# Patient Record
Sex: Female | Born: 1935 | Race: White | Hispanic: No | Marital: Married | Smoking: Former smoker
Health system: Southern US, Community
[De-identification: ages and names within clinical notes are randomized; demographics above are authoritative.]

## PROBLEM LIST (undated history)

## (undated) DIAGNOSIS — J449 Chronic obstructive pulmonary disease, unspecified: Secondary | ICD-10-CM

## (undated) DIAGNOSIS — I639 Cerebral infarction, unspecified: Secondary | ICD-10-CM

## (undated) DIAGNOSIS — M1990 Unspecified osteoarthritis, unspecified site: Secondary | ICD-10-CM

## (undated) DIAGNOSIS — I10 Essential (primary) hypertension: Secondary | ICD-10-CM

## (undated) DIAGNOSIS — F419 Anxiety disorder, unspecified: Secondary | ICD-10-CM

## (undated) DIAGNOSIS — W19XXXA Unspecified fall, initial encounter: Secondary | ICD-10-CM

## (undated) HISTORY — PX: REPLACEMENT TOTAL KNEE BILATERAL: SUR1225

## (undated) HISTORY — DX: Unspecified fall, initial encounter: W19.XXXA

## (undated) HISTORY — PX: APPENDECTOMY: SHX54

## (undated) HISTORY — PX: HUMERUS FRACTURE SURGERY: SHX670

---

## 2006-08-13 ENCOUNTER — Emergency Department (HOSPITAL_COMMUNITY): Admission: EM | Admit: 2006-08-13 | Discharge: 2006-08-13 | Payer: Self-pay | Admitting: Emergency Medicine

## 2006-09-13 ENCOUNTER — Ambulatory Visit (HOSPITAL_COMMUNITY): Admission: RE | Admit: 2006-09-13 | Discharge: 2006-09-13 | Payer: Self-pay | Admitting: Family Medicine

## 2007-08-25 ENCOUNTER — Ambulatory Visit (HOSPITAL_COMMUNITY): Admission: RE | Admit: 2007-08-25 | Discharge: 2007-08-25 | Payer: Self-pay | Admitting: Gastroenterology

## 2007-08-25 ENCOUNTER — Encounter: Payer: Self-pay | Admitting: Gastroenterology

## 2007-08-25 ENCOUNTER — Ambulatory Visit: Payer: Self-pay | Admitting: Gastroenterology

## 2007-12-19 ENCOUNTER — Ambulatory Visit (HOSPITAL_COMMUNITY): Admission: RE | Admit: 2007-12-19 | Discharge: 2007-12-19 | Payer: Self-pay | Admitting: Family Medicine

## 2009-01-08 ENCOUNTER — Ambulatory Visit (HOSPITAL_COMMUNITY): Admission: RE | Admit: 2009-01-08 | Discharge: 2009-01-08 | Payer: Self-pay | Admitting: Family Medicine

## 2010-01-12 ENCOUNTER — Ambulatory Visit (HOSPITAL_COMMUNITY): Admission: RE | Admit: 2010-01-12 | Discharge: 2010-01-12 | Payer: Self-pay | Admitting: Family Medicine

## 2010-03-03 ENCOUNTER — Encounter: Admission: RE | Admit: 2010-03-03 | Discharge: 2010-03-03 | Payer: Self-pay | Admitting: Internal Medicine

## 2010-12-08 ENCOUNTER — Other Ambulatory Visit (HOSPITAL_COMMUNITY): Payer: Self-pay | Admitting: Family Medicine

## 2010-12-08 ENCOUNTER — Other Ambulatory Visit: Payer: Self-pay | Admitting: Family Medicine

## 2010-12-08 DIAGNOSIS — Z1239 Encounter for other screening for malignant neoplasm of breast: Secondary | ICD-10-CM

## 2011-01-14 ENCOUNTER — Ambulatory Visit (HOSPITAL_COMMUNITY): Payer: Self-pay

## 2011-01-28 ENCOUNTER — Ambulatory Visit (HOSPITAL_COMMUNITY)
Admission: RE | Admit: 2011-01-28 | Discharge: 2011-01-28 | Disposition: A | Discharge status: Home / Self Care | Payer: Medicare Other | Source: Ambulatory Visit | Attending: Family Medicine | Admitting: Family Medicine

## 2011-01-28 DIAGNOSIS — Z1239 Encounter for other screening for malignant neoplasm of breast: Secondary | ICD-10-CM

## 2011-01-28 DIAGNOSIS — Z1231 Encounter for screening mammogram for malignant neoplasm of breast: Secondary | ICD-10-CM | POA: Insufficient documentation

## 2011-03-30 NOTE — Op Note (Signed)
NAMEMARVELL, STAVOLA               ACCOUNT NO.:  [id number]   MEDICAL RECORD NO.:  [id number]          PATIENT TYPE:  AMB   LOCATION:  DAY                           FACILITY:  [facility]   PHYSICIAN:  Kassie Mends, M.D.      DATE OF BIRTH:  09-May-1932   DATE OF PROCEDURE:  08/25/2007  DATE OF DISCHARGE:                               OPERATIVE REPORT   REFERRING PHYSICIAN:  [facility].   PROCEDURE:  Ileocolonoscopy with snare cautery polypectomy.   INDICATION FOR EXAMINATION:  Ms. Alatorre is a 77 year old female who  presents with a family history of colon cancer.  She presents for colon  cancer screening.   FINDINGS:  1. An 8-mm sessile polyp seen in the sigmoid colon.  It was located on      the fold.  It was removed via snare cautery.  2. Sigmoid colon diverticulosis.  3. Small internal hemorrhoids.  Otherwise no masses, inflammatory      changes or arteriovenous malformation seen.   RECOMMENDATIONS:  1. No aspirin, NSAIDs or anticoagulation for 7 days.  2. She should follow a high-fiber diet.  She is given a handout on      high-fiber diet, polyps, diverticulosis and hemorrhoids.  3. Will call Ms. Highland with the results of her biopsies.  She will      likely need a screening colonoscopy in 5 years.  If polyp is      adenomatous, then her children should have a screening colonoscopy      at age 75 and then every 10 years because her polyp will be      diagnosed after the age of 31.   MEDICATIONS:  1. Demerol 100 mg IV.  2. Versed 6 mg IV.   PROCEDURE TECHNIQUE:  Physical exam was performed.  Informed consent was  obtained from the patient explaining the benefits, risks and  alternatives to the procedure.  The patient was connected to the monitor  and placed in the left lateral position.  Continuous oxygen was provided  by nasal cannula and IV medicine administered through an indwelling  cannula.  After administration of sedation and rectal exam, the  patient's  rectum was intubated,  and the scope was advanced under direct visualization to the distal  terminal ileum.  The scope was removed slowly by carefully examining the  color, texture, anatomy and integrity of the mucosa on the way out.  The  patient was recovered in endoscopy and discharged home in satisfactory  condition.      Kassie Mends, M.D.  Electronically Signed     SM/MEDQ  D:  08/25/2007  T:  08/26/2007  Job:  [id number]   cc:   Gloriajean Dell. Andrey Campanile, M.D.  Fax: [phone]

## 2012-01-12 ENCOUNTER — Other Ambulatory Visit (HOSPITAL_COMMUNITY): Payer: Self-pay | Admitting: Family Medicine

## 2012-01-12 DIAGNOSIS — Z139 Encounter for screening, unspecified: Secondary | ICD-10-CM

## 2012-01-31 ENCOUNTER — Ambulatory Visit (HOSPITAL_COMMUNITY): Payer: Medicare Other

## 2012-02-01 ENCOUNTER — Ambulatory Visit (HOSPITAL_COMMUNITY)
Admission: RE | Admit: 2012-02-01 | Discharge: 2012-02-01 | Disposition: A | Discharge status: Home / Self Care | Payer: Medicare Other | Source: Ambulatory Visit | Attending: Family Medicine | Admitting: Family Medicine

## 2012-02-01 DIAGNOSIS — Z139 Encounter for screening, unspecified: Secondary | ICD-10-CM

## 2012-02-01 DIAGNOSIS — Z1231 Encounter for screening mammogram for malignant neoplasm of breast: Secondary | ICD-10-CM | POA: Insufficient documentation

## 2012-08-03 ENCOUNTER — Encounter: Payer: Self-pay | Admitting: *Deleted

## 2012-10-25 ENCOUNTER — Encounter: Payer: Self-pay | Admitting: *Deleted

## 2012-11-27 ENCOUNTER — Telehealth: Payer: Self-pay | Admitting: *Deleted

## 2012-11-27 NOTE — Telephone Encounter (Signed)
I called [name] [name] today to try to set up an appointment for her screening TCS, and she said that she would like to think about it and call us back.

## 2013-01-03 ENCOUNTER — Other Ambulatory Visit (HOSPITAL_COMMUNITY): Payer: Self-pay | Admitting: Family Medicine

## 2013-01-03 DIAGNOSIS — Z139 Encounter for screening, unspecified: Secondary | ICD-10-CM

## 2013-02-09 ENCOUNTER — Ambulatory Visit (HOSPITAL_COMMUNITY)
Admission: RE | Admit: 2013-02-09 | Discharge: 2013-02-09 | Disposition: A | Discharge status: Home / Self Care | Payer: Medicare Other | Source: Ambulatory Visit | Attending: Family Medicine | Admitting: Family Medicine

## 2013-02-09 DIAGNOSIS — Z1231 Encounter for screening mammogram for malignant neoplasm of breast: Secondary | ICD-10-CM | POA: Insufficient documentation

## 2013-02-09 DIAGNOSIS — Z139 Encounter for screening, unspecified: Secondary | ICD-10-CM

## 2013-11-21 ENCOUNTER — Encounter (HOSPITAL_COMMUNITY): Payer: Self-pay | Admitting: Pharmacy Technician

## 2013-11-21 NOTE — Patient Instructions (Addendum)
Your procedure is scheduled on: 11/27/2013  Report to [facility] at  800  AM.  Call this number if you have problems the morning of surgery: [phone]   Do not eat food or drink liquids :After Midnight.      Take these medicines the morning of surgery with A SIP OF WATER: xanax, hydrodiuril, altace, ultram, arava. Take your inhaler before you come and bring it with you.   Do not wear jewelry, make-up or nail polish.  Do not wear lotions, powders, or perfumes.   Do not shave 48 hours prior to surgery.  Do not bring valuables to the hospital.  Contacts, dentures or bridgework may not be worn into surgery.  Leave suitcase in the car. After surgery it may be brought to your room.  For patients admitted to the hospital, checkout time is 11:00 AM the day of discharge.   Patients discharged the day of surgery will not be allowed to drive home.  :     Please read over the following fact sheets that you were given: Coughing and Deep Breathing, Surgical Site Infection Prevention, Anesthesia Post-op Instructions and Care and Recovery After Surgery    Cataract A cataract is a clouding of the lens of the eye. When a lens becomes cloudy, vision is reduced based on the degree and nature of the clouding. Many cataracts reduce vision to some degree. Some cataracts make people more near-sighted as they develop. Other cataracts increase glare. Cataracts that are ignored and become worse can sometimes look white. The white color can be seen through the pupil. CAUSES   Aging. However, cataracts may occur at any age, even in newborns.   Certain drugs.   Trauma to the eye.   Certain diseases such as diabetes.   Specific eye diseases such as chronic inflammation inside the eye or a sudden attack of a rare form of glaucoma.   Inherited or acquired medical problems.  SYMPTOMS   Gradual, progressive drop in vision in the affected eye.   Severe, rapid visual loss. This most often happens when trauma is  the cause.  DIAGNOSIS  To detect a cataract, an eye doctor examines the lens. Cataracts are best diagnosed with an exam of the eyes with the pupils enlarged (dilated) by drops.  TREATMENT  For an early cataract, vision may improve by using different eyeglasses or stronger lighting. If that does not help your vision, surgery is the only effective treatment. A cataract needs to be surgically removed when vision loss interferes with your everyday activities, such as driving, reading, or watching TV. A cataract may also have to be removed if it prevents examination or treatment of another eye problem. Surgery removes the cloudy lens and usually replaces it with a substitute lens (intraocular lens, IOL).  At a time when both you and your doctor agree, the cataract will be surgically removed. If you have cataracts in both eyes, only one is usually removed at a time. This allows the operated eye to heal and be out of danger from any possible problems after surgery (such as infection or poor wound healing). In rare cases, a cataract may be doing damage to your eye. In these cases, your caregiver may advise surgical removal right away. The vast majority of people who have cataract surgery have better vision afterward. HOME CARE INSTRUCTIONS  If you are not planning surgery, you may be asked to do the following:  Use different eyeglasses.   Use stronger or brighter lighting.  Ask your eye doctor about reducing your medicine dose or changing medicines if it is thought that a medicine caused your cataract. Changing medicines does not make the cataract go away on its own.   Become familiar with your surroundings. Poor vision can lead to injury. Avoid bumping into things on the affected side. You are at a higher risk for tripping or falling.   Exercise extreme care when driving or operating machinery.   Wear sunglasses if you are sensitive to bright light or experiencing problems with glare.  SEEK IMMEDIATE  MEDICAL CARE IF:   You have a worsening or sudden vision loss.   You notice redness, swelling, or increasing pain in the eye.   You have a fever.  Document Released: 11/01/2005 Document Revised: 10/21/2011 Document Reviewed: 06/25/2011 [facility] Patient Information 2012 [place], [place].PATIENT INSTRUCTIONS POST-ANESTHESIA  IMMEDIATELY FOLLOWING SURGERY:  Do not drive or operate machinery for the first twenty four hours after surgery.  Do not make any important decisions for twenty four hours after surgery or while taking narcotic pain medications or sedatives.  If you develop intractable nausea and vomiting or a severe headache please notify your doctor immediately.  FOLLOW-UP:  Please make an appointment with your surgeon as instructed. You do not need to follow up with anesthesia unless specifically instructed to do so.  WOUND CARE INSTRUCTIONS (if applicable):  Keep a dry clean dressing on the anesthesia/puncture wound site if there is drainage.  Once the wound has quit draining you may leave it open to air.  Generally you should leave the bandage intact for twenty four hours unless there is drainage.  If the epidural site drains for more than 36-48 hours please call the anesthesia department.  QUESTIONS?:  Please feel free to call your physician or the hospital operator if you have any questions, and they will be happy to assist you.

## 2013-11-22 ENCOUNTER — Encounter (HOSPITAL_COMMUNITY): Payer: Self-pay | Admitting: Pharmacy Technician

## 2013-11-22 ENCOUNTER — Encounter (HOSPITAL_COMMUNITY)
Admission: RE | Admit: 2013-11-22 | Discharge: 2013-11-22 | Disposition: A | Discharge status: Home / Self Care | Payer: Medicare Other | Source: Ambulatory Visit | Attending: Ophthalmology | Admitting: Ophthalmology

## 2013-11-22 ENCOUNTER — Encounter (HOSPITAL_COMMUNITY): Payer: Self-pay

## 2013-11-22 DIAGNOSIS — Z01812 Encounter for preprocedural laboratory examination: Secondary | ICD-10-CM | POA: Insufficient documentation

## 2013-11-22 DIAGNOSIS — Z01818 Encounter for other preprocedural examination: Secondary | ICD-10-CM | POA: Insufficient documentation

## 2013-11-22 DIAGNOSIS — Z01810 Encounter for preprocedural cardiovascular examination: Secondary | ICD-10-CM | POA: Insufficient documentation

## 2013-11-22 HISTORY — DX: Unspecified osteoarthritis, unspecified site: M19.90

## 2013-11-22 HISTORY — DX: Essential (primary) hypertension: I10

## 2013-11-22 HISTORY — DX: Anxiety disorder, unspecified: F41.9

## 2013-11-22 HISTORY — DX: Chronic obstructive pulmonary disease, unspecified: J44.9

## 2013-11-22 LAB — HEMOGLOBIN AND HEMATOCRIT, BLOOD
HCT: 33.2 % — ABNORMAL LOW (ref 36.0–46.0)
Hemoglobin: 11.2 g/dL — ABNORMAL LOW (ref 12.0–15.0)

## 2013-11-22 LAB — BASIC METABOLIC PANEL
BUN: 18 mg/dL (ref 6–23)
CHLORIDE: 91 meq/L — AB (ref 96–112)
CO2: 28 meq/L (ref 19–32)
Calcium: 10.2 mg/dL (ref 8.4–10.5)
Creatinine, Ser: 0.95 mg/dL (ref 0.50–1.10)
GFR calc Af Amer: 64 mL/min — ABNORMAL LOW (ref >90)
GFR calc non Af Amer: 55 mL/min — ABNORMAL LOW (ref >90)
GLUCOSE: 88 mg/dL (ref 70–99)
POTASSIUM: 4.2 meq/L (ref 3.7–5.3)
Sodium: 132 mEq/L — ABNORMAL LOW (ref 137–147)

## 2013-11-26 ENCOUNTER — Encounter (HOSPITAL_COMMUNITY): Payer: Self-pay | Admitting: Pharmacy Technician

## 2013-11-26 MED ORDER — PHENYLEPHRINE HCL 2.5 % OP SOLN
OPHTHALMIC | Status: AC
  Filled 2013-11-26: qty 15

## 2013-11-26 MED ORDER — CYCLOPENTOLATE-PHENYLEPHRINE OP SOLN OPTIME - NO CHARGE
OPHTHALMIC | Status: AC
  Filled 2013-11-26: qty 2

## 2013-11-26 MED ORDER — KETOROLAC TROMETHAMINE 0.5 % OP SOLN
OPHTHALMIC | Status: AC
  Filled 2013-11-26: qty 5

## 2013-11-26 MED ORDER — TETRACAINE HCL 0.5 % OP SOLN
OPHTHALMIC | Status: AC
  Filled 2013-11-26: qty 2

## 2013-11-27 ENCOUNTER — Ambulatory Visit (HOSPITAL_COMMUNITY)
Admission: RE | Admit: 2013-11-27 | Discharge: 2013-11-27 | Disposition: A | Discharge status: Home / Self Care | Payer: Medicare Other | Source: Ambulatory Visit | Attending: Ophthalmology | Admitting: Ophthalmology

## 2013-11-27 ENCOUNTER — Encounter (HOSPITAL_COMMUNITY): Payer: Medicare Other | Admitting: Anesthesiology

## 2013-11-27 ENCOUNTER — Encounter (HOSPITAL_COMMUNITY)
Admission: RE | Disposition: A | Discharge status: Home / Self Care | Payer: Self-pay | Source: Ambulatory Visit | Attending: Ophthalmology

## 2013-11-27 ENCOUNTER — Ambulatory Visit (HOSPITAL_COMMUNITY): Payer: Medicare Other | Admitting: Anesthesiology

## 2013-11-27 DIAGNOSIS — J4489 Other specified chronic obstructive pulmonary disease: Secondary | ICD-10-CM | POA: Insufficient documentation

## 2013-11-27 DIAGNOSIS — I10 Essential (primary) hypertension: Secondary | ICD-10-CM | POA: Insufficient documentation

## 2013-11-27 DIAGNOSIS — J449 Chronic obstructive pulmonary disease, unspecified: Secondary | ICD-10-CM | POA: Insufficient documentation

## 2013-11-27 DIAGNOSIS — H2510 Age-related nuclear cataract, unspecified eye: Secondary | ICD-10-CM | POA: Insufficient documentation

## 2013-11-27 HISTORY — PX: CATARACT EXTRACTION W/PHACO: SHX586

## 2013-11-27 SURGERY — PHACOEMULSIFICATION, CATARACT, WITH IOL INSERTION
Anesthesia: Monitor Anesthesia Care | Site: Eye | Laterality: Right

## 2013-11-27 MED ORDER — BSS IO SOLN
INTRAOCULAR | Status: DC | PRN
  Administered 2013-11-27: 15 mL via INTRAOCULAR

## 2013-11-27 MED ORDER — FENTANYL CITRATE 0.05 MG/ML IJ SOLN
25.0000 ug | INTRAMUSCULAR | Status: AC
  Administered 2013-11-27: 25 ug via INTRAVENOUS

## 2013-11-27 MED ORDER — TETRACAINE 0.5 % OP SOLN OPTIME - NO CHARGE
OPHTHALMIC | Status: DC | PRN
  Administered 2013-11-27: 1 [drp] via OPHTHALMIC

## 2013-11-27 MED ORDER — CYCLOPENTOLATE-PHENYLEPHRINE 0.2-1 % OP SOLN
1.0000 [drp] | OPHTHALMIC | Status: AC
  Administered 2013-11-27 (×3): 1 [drp] via OPHTHALMIC

## 2013-11-27 MED ORDER — KETOROLAC TROMETHAMINE 0.5 % OP SOLN
1.0000 [drp] | OPHTHALMIC | Status: AC
  Administered 2013-11-27 (×3): 1 [drp] via OPHTHALMIC

## 2013-11-27 MED ORDER — EPINEPHRINE HCL 1 MG/ML IJ SOLN
INTRAOCULAR | Status: DC | PRN
  Administered 2013-11-27: 10:00:00

## 2013-11-27 MED ORDER — MIDAZOLAM HCL 2 MG/2ML IJ SOLN
INTRAMUSCULAR | Status: AC
  Filled 2013-11-27: qty 2

## 2013-11-27 MED ORDER — TETRACAINE HCL 0.5 % OP SOLN
1.0000 [drp] | OPHTHALMIC | Status: AC
  Administered 2013-11-27 (×3): 1 [drp] via OPHTHALMIC

## 2013-11-27 MED ORDER — FENTANYL CITRATE 0.05 MG/ML IJ SOLN
INTRAMUSCULAR | Status: AC
  Filled 2013-11-27: qty 2

## 2013-11-27 MED ORDER — MIDAZOLAM HCL 2 MG/2ML IJ SOLN
1.0000 mg | INTRAMUSCULAR | Status: DC | PRN
  Administered 2013-11-27 (×2): 1 mg via INTRAVENOUS

## 2013-11-27 MED ORDER — PHENYLEPHRINE HCL 2.5 % OP SOLN
1.0000 [drp] | OPHTHALMIC | Status: AC
  Administered 2013-11-27 (×3): 1 [drp] via OPHTHALMIC

## 2013-11-27 MED ORDER — LACTATED RINGERS IV SOLN
INTRAVENOUS | Status: DC
  Administered 2013-11-27: 1000 mL via INTRAVENOUS

## 2013-11-27 MED ORDER — PROVISC 10 MG/ML IO SOLN
INTRAOCULAR | Status: DC | PRN
  Administered 2013-11-27: 0.85 mL via INTRAOCULAR

## 2013-11-27 SURGICAL SUPPLY — 24 items
CAPSULAR TENSION RING-AMO (OPHTHALMIC RELATED) IMPLANT
CLOTH BEACON ORANGE TIMEOUT ST (SAFETY) ×2 IMPLANT
EYE SHIELD UNIVERSAL CLEAR (GAUZE/BANDAGES/DRESSINGS) ×2 IMPLANT
GLOVE BIO SURGEON STRL SZ 6.5 (GLOVE) ×1 IMPLANT
GLOVE BIO SURGEONS STRL SZ 6.5 (GLOVE) ×1
GLOVE ECLIPSE 6.5 STRL STRAW (GLOVE) IMPLANT
GLOVE ECLIPSE 7.0 STRL STRAW (GLOVE) IMPLANT
GLOVE EXAM NITRILE LRG STRL (GLOVE) ×2 IMPLANT
GLOVE EXAM NITRILE MD LF STRL (GLOVE) IMPLANT
GLOVE SKINSENSE NS SZ6.5 (GLOVE)
GLOVE SKINSENSE STRL SZ6.5 (GLOVE) IMPLANT
HEALON 5 0.6 ML (INTRAOCULAR LENS) IMPLANT
KIT VITRECTOMY (OPHTHALMIC RELATED) IMPLANT
PAD ARMBOARD 7.5X6 YLW CONV (MISCELLANEOUS) ×2 IMPLANT
PROC W NO LENS (INTRAOCULAR LENS)
PROC W SPEC LENS (INTRAOCULAR LENS)
PROCESS W NO LENS (INTRAOCULAR LENS) IMPLANT
PROCESS W SPEC LENS (INTRAOCULAR LENS) IMPLANT
RING MALYGIN (MISCELLANEOUS) IMPLANT
SIGHTPATH CAT PROC W REG LENS (Ophthalmic Related) ×3 IMPLANT
TAPE SURG TRANSPORE 1 IN (GAUZE/BANDAGES/DRESSINGS) IMPLANT
TAPE SURGICAL TRANSPORE 1 IN (GAUZE/BANDAGES/DRESSINGS) ×2
VISCOELASTIC ADDITIONAL (OPHTHALMIC RELATED) IMPLANT
WATER STERILE IRR 250ML POUR (IV SOLUTION) ×2 IMPLANT

## 2013-11-27 NOTE — H&P (Signed)
The patient was re examined and there is no change in the patients condition since the original H and P. 

## 2013-11-27 NOTE — Discharge Instructions (Signed)
[name] [name]  11/27/2013           [facility] Instructions [address] [place] [address]      1. Avoid closing eyes tightly. One often closes the eye tightly when laughing, talking, sneezing, coughing or if they feel irritated. At these times, you should be careful not to close your eyes tightly.  2. Instill eye drops as instructed. To instill drops in your eye, open it, look up and have someone gently pull the lower lid down and instill a couple of drops inside the lower lid.  3. Do not touch upper lid.  4. Take Advil or Tylenol for pain.  5. You may use either eye for near work, such as reading or sewing and you may watch television.  6. You may have your hair done at the beauty parlor at any time.  7. Wear dark glasses with or without your own glasses if you are in bright light.  8. Call our office at [phone] or [phone] if you have sharp pain in your eye or unusual symptoms.  9. Do not be concerned because vision in the operative eye is not good. It will not be good, no matter how successful the operation, until you get a special lens for it. Your old glasses will not be suited to the new eye that was operated on and you will not be ready for a new lens for about a month.  10. Follow up at the [facility] office.    I have received a copy of the above instructions and will follow them.

## 2013-11-27 NOTE — Transfer of Care (Signed)
Immediate Anesthesia Transfer of Care Note  Patient: [name] [name]  Procedure(s) Performed: Procedure(s) (LRB): CATARACT EXTRACTION PHACO AND INTRAOCULAR LENS PLACEMENT (IOC) (Right)  Patient Location: Shortstay  Anesthesia Type: MAC  Level of Consciousness: awake  Airway & Oxygen Therapy: Patient Spontanous Breathing   Post-op Assessment: Report given to PACU RN, Post -op Vital signs reviewed and stable and Patient moving all extremities  Post vital signs: Reviewed and stable  Complications: No apparent anesthesia complications

## 2013-11-27 NOTE — Anesthesia Postprocedure Evaluation (Signed)
  Anesthesia Post-op Note  Patient: [name] [name]  Procedure(s) Performed: Procedure(s) (LRB): CATARACT EXTRACTION PHACO AND INTRAOCULAR LENS PLACEMENT (IOC) (Right)  Patient Location:  Short Stay  Anesthesia Type: MAC  Level of Consciousness: awake  Airway and Oxygen Therapy: Patient Spontanous Breathing  Post-op Pain: none  Post-op Assessment: Post-op Vital signs reviewed, Patient's Cardiovascular Status Stable, Respiratory Function Stable, Patent Airway, No signs of Nausea or vomiting and Pain level controlled  Post-op Vital Signs: Reviewed and stable  Complications: No apparent anesthesia complications

## 2013-11-27 NOTE — Anesthesia Preprocedure Evaluation (Signed)
Anesthesia Evaluation  Patient identified by MRN, date of birth, ID band Patient awake    Reviewed: Allergy & Precautions, H&P , NPO status , Patient's Chart, lab work & pertinent test results  Airway Mallampati: II TM Distance: >3 FB     Dental  (+) Edentulous Upper and Edentulous Lower   Pulmonary COPD oxygen dependent, former smoker,  breath sounds clear to auscultation        Cardiovascular hypertension, Pt. on medications Rhythm:Regular Rate:Normal     Neuro/Psych PSYCHIATRIC DISORDERS Anxiety    GI/Hepatic   Endo/Other    Renal/GU      Musculoskeletal  (+) Arthritis -, Rheumatoid disorders,    Abdominal   Peds  Hematology   Anesthesia Other Findings   Reproductive/Obstetrics                           Anesthesia Physical Anesthesia Plan  ASA: III  Anesthesia Plan: MAC   Post-op Pain Management:    Induction: Intravenous  Airway Management Planned: Nasal Cannula  Additional Equipment:   Intra-op Plan:   Post-operative Plan:   Informed Consent: I have reviewed the patients History and Physical, chart, labs and discussed the procedure including the risks, benefits and alternatives for the proposed anesthesia with the patient or authorized representative who has indicated his/her understanding and acceptance.     Plan Discussed with:   Anesthesia Plan Comments:         Anesthesia Quick Evaluation

## 2013-11-27 NOTE — Progress Notes (Signed)
States uses O2 at home at night. O2 via nasal cannula started.

## 2013-11-27 NOTE — Anesthesia Procedure Notes (Signed)
Procedure Name: MAC Date/Time: 11/27/2013 8:25 AM Performed by: Franco Nones Pre-anesthesia Checklist: Patient identified, Emergency Drugs available, Suction available, Timeout performed and Patient being monitored Patient Re-evaluated:Patient Re-evaluated prior to inductionOxygen Delivery Method: Nasal Cannula

## 2013-11-27 NOTE — Op Note (Signed)
Patient brought to the operating room and prepped and draped in the usual manner.  Lid speculum inserted in right eye.  Stab incision made at the twelve o'clock position.  Provisc instilled in the anterior chamber.   A 2.4 mm. Stab incision was made temporally.  An anterior capsulotomy was done with a bent 25 gauge needle.  The nucleus was hydrodissected.  The Phaco tip was inserted in the anterior chamber and the nucleus was emulsified.  CDE was 7.31.  The cortical material was then removed with the I and A tip.  Posterior capsule was the polished.  The anterior chamber was deepened with Provisc.  A 24.5 Diopter Rayner 570C IOL was then inserted in the capsular bag.  Provisc was then removed with the I and A tip.  The wound was then hydrated.  Patient sent to the Recovery Room in good condition with follow up in my office.  Preoperative Diagnosis:  Nuclear Cataract OD Postoperative Diagnosis:  Same Procedure name: Kelman Phacoemulsification OD with IOL

## 2013-11-28 ENCOUNTER — Encounter (HOSPITAL_COMMUNITY): Payer: Self-pay | Admitting: Ophthalmology

## 2013-12-04 ENCOUNTER — Encounter (HOSPITAL_COMMUNITY): Payer: Self-pay | Admitting: Pharmacy Technician

## 2013-12-04 MED ORDER — FENTANYL CITRATE 0.05 MG/ML IJ SOLN: 25.0000 ug | INTRAMUSCULAR | Status: DC | PRN

## 2013-12-04 MED ORDER — ONDANSETRON HCL 4 MG/2ML IJ SOLN: 4.0000 mg | Freq: Once | INTRAMUSCULAR | Status: AC | PRN

## 2013-12-05 ENCOUNTER — Encounter (HOSPITAL_COMMUNITY): Payer: Self-pay

## 2013-12-05 ENCOUNTER — Encounter (HOSPITAL_COMMUNITY): Payer: Medicare Other | Attending: Ophthalmology

## 2013-12-10 MED ORDER — PHENYLEPHRINE HCL 2.5 % OP SOLN
OPHTHALMIC | Status: AC
Start: 2013-12-10 — End: 2013-12-10
  Filled 2013-12-10: qty 15

## 2013-12-10 MED ORDER — TETRACAINE HCL 0.5 % OP SOLN
OPHTHALMIC | Status: AC
  Filled 2013-12-10: qty 2

## 2013-12-10 MED ORDER — CYCLOPENTOLATE-PHENYLEPHRINE OP SOLN OPTIME - NO CHARGE
OPHTHALMIC | Status: AC
  Filled 2013-12-10: qty 2

## 2013-12-10 MED ORDER — KETOROLAC TROMETHAMINE 0.5 % OP SOLN
OPHTHALMIC | Status: AC
  Filled 2013-12-10: qty 5

## 2013-12-11 ENCOUNTER — Ambulatory Visit (HOSPITAL_COMMUNITY)
Admission: RE | Admit: 2013-12-11 | Discharge: 2013-12-11 | Disposition: A | Discharge status: Home / Self Care | Payer: Medicare Other | Source: Ambulatory Visit | Attending: Ophthalmology | Admitting: Ophthalmology

## 2013-12-11 ENCOUNTER — Encounter (HOSPITAL_COMMUNITY): Payer: Self-pay | Admitting: *Deleted

## 2013-12-11 ENCOUNTER — Encounter (HOSPITAL_COMMUNITY)
Admission: RE | Disposition: A | Discharge status: Home / Self Care | Payer: Self-pay | Source: Ambulatory Visit | Attending: Ophthalmology

## 2013-12-11 ENCOUNTER — Encounter (HOSPITAL_COMMUNITY): Payer: Medicare Other | Admitting: Anesthesiology

## 2013-12-11 ENCOUNTER — Ambulatory Visit (HOSPITAL_COMMUNITY): Payer: Medicare Other | Admitting: Anesthesiology

## 2013-12-11 DIAGNOSIS — I10 Essential (primary) hypertension: Secondary | ICD-10-CM | POA: Insufficient documentation

## 2013-12-11 DIAGNOSIS — J449 Chronic obstructive pulmonary disease, unspecified: Secondary | ICD-10-CM | POA: Insufficient documentation

## 2013-12-11 DIAGNOSIS — J4489 Other specified chronic obstructive pulmonary disease: Secondary | ICD-10-CM | POA: Insufficient documentation

## 2013-12-11 DIAGNOSIS — H2510 Age-related nuclear cataract, unspecified eye: Secondary | ICD-10-CM | POA: Insufficient documentation

## 2013-12-11 DIAGNOSIS — Z79899 Other long term (current) drug therapy: Secondary | ICD-10-CM | POA: Insufficient documentation

## 2013-12-11 HISTORY — PX: CATARACT EXTRACTION W/PHACO: SHX586

## 2013-12-11 SURGERY — PHACOEMULSIFICATION, CATARACT, WITH IOL INSERTION
Anesthesia: Monitor Anesthesia Care | Site: Eye | Laterality: Left

## 2013-12-11 MED ORDER — FENTANYL CITRATE 0.05 MG/ML IJ SOLN
INTRAMUSCULAR | Status: AC
  Filled 2013-12-11: qty 2

## 2013-12-11 MED ORDER — PROVISC 10 MG/ML IO SOLN
INTRAOCULAR | Status: DC | PRN
  Administered 2013-12-11: 0.85 mL via INTRAOCULAR

## 2013-12-11 MED ORDER — TETRACAINE 0.5 % OP SOLN OPTIME - NO CHARGE
OPHTHALMIC | Status: DC | PRN
  Administered 2013-12-11: 1 [drp] via OPHTHALMIC

## 2013-12-11 MED ORDER — FENTANYL CITRATE 0.05 MG/ML IJ SOLN: 25.0000 ug | INTRAMUSCULAR | Status: DC | PRN

## 2013-12-11 MED ORDER — ONDANSETRON HCL 4 MG/2ML IJ SOLN
4.0000 mg | Freq: Once | INTRAMUSCULAR | Status: DC | PRN
Start: 2013-12-11 — End: 2013-12-11

## 2013-12-11 MED ORDER — PHENYLEPHRINE HCL 2.5 % OP SOLN
1.0000 [drp] | OPHTHALMIC | Status: AC
  Administered 2013-12-11 (×3): 1 [drp] via OPHTHALMIC

## 2013-12-11 MED ORDER — LACTATED RINGERS IV SOLN
INTRAVENOUS | Status: DC
  Administered 2013-12-11: 08:00:00 via INTRAVENOUS

## 2013-12-11 MED ORDER — FENTANYL CITRATE 0.05 MG/ML IJ SOLN
25.0000 ug | INTRAMUSCULAR | Status: AC
  Administered 2013-12-11 (×2): 25 ug via INTRAVENOUS

## 2013-12-11 MED ORDER — KETOROLAC TROMETHAMINE 0.5 % OP SOLN
1.0000 [drp] | OPHTHALMIC | Status: AC
  Administered 2013-12-11 (×3): 1 [drp] via OPHTHALMIC

## 2013-12-11 MED ORDER — MIDAZOLAM HCL 2 MG/2ML IJ SOLN
INTRAMUSCULAR | Status: AC
  Filled 2013-12-11: qty 2

## 2013-12-11 MED ORDER — BSS IO SOLN
INTRAOCULAR | Status: DC | PRN
  Administered 2013-12-11: 15 mL via INTRAOCULAR

## 2013-12-11 MED ORDER — MIDAZOLAM HCL 2 MG/2ML IJ SOLN
1.0000 mg | INTRAMUSCULAR | Status: DC | PRN
  Administered 2013-12-11: 2 mg via INTRAVENOUS

## 2013-12-11 MED ORDER — EPINEPHRINE HCL 1 MG/ML IJ SOLN
INTRAOCULAR | Status: DC | PRN
  Administered 2013-12-11: 08:00:00

## 2013-12-11 MED ORDER — TETRACAINE HCL 0.5 % OP SOLN
1.0000 [drp] | OPHTHALMIC | Status: AC
  Administered 2013-12-11 (×3): 1 [drp] via OPHTHALMIC

## 2013-12-11 MED ORDER — CYCLOPENTOLATE-PHENYLEPHRINE 0.2-1 % OP SOLN
1.0000 [drp] | OPHTHALMIC | Status: AC
  Administered 2013-12-11 (×3): 1 [drp] via OPHTHALMIC

## 2013-12-11 SURGICAL SUPPLY — 24 items
CAPSULAR TENSION RING-AMO (OPHTHALMIC RELATED) IMPLANT
CLOTH BEACON ORANGE TIMEOUT ST (SAFETY) ×2 IMPLANT
EYE SHIELD UNIVERSAL CLEAR (GAUZE/BANDAGES/DRESSINGS) ×2 IMPLANT
GLOVE BIO SURGEON STRL SZ 6.5 (GLOVE) IMPLANT
GLOVE BIO SURGEONS STRL SZ 6.5 (GLOVE)
GLOVE ECLIPSE 6.5 STRL STRAW (GLOVE) IMPLANT
GLOVE ECLIPSE 7.0 STRL STRAW (GLOVE) IMPLANT
GLOVE EXAM NITRILE LRG STRL (GLOVE) IMPLANT
GLOVE EXAM NITRILE MD LF STRL (GLOVE) ×2 IMPLANT
GLOVE SKINSENSE NS SZ6.5 (GLOVE) ×2
GLOVE SKINSENSE STRL SZ6.5 (GLOVE) IMPLANT
HEALON 5 0.6 ML (INTRAOCULAR LENS) IMPLANT
KIT VITRECTOMY (OPHTHALMIC RELATED) IMPLANT
PAD ARMBOARD 7.5X6 YLW CONV (MISCELLANEOUS) ×2 IMPLANT
PROC W NO LENS (INTRAOCULAR LENS)
PROC W SPEC LENS (INTRAOCULAR LENS)
PROCESS W NO LENS (INTRAOCULAR LENS) IMPLANT
PROCESS W SPEC LENS (INTRAOCULAR LENS) IMPLANT
RING MALYGIN (MISCELLANEOUS) IMPLANT
SIGHTPATH CAT PROC W REG LENS (Ophthalmic Related) ×3 IMPLANT
TAPE SURG TRANSPORE 1 IN (GAUZE/BANDAGES/DRESSINGS) IMPLANT
TAPE SURGICAL TRANSPORE 1 IN (GAUZE/BANDAGES/DRESSINGS) ×2
VISCOELASTIC ADDITIONAL (OPHTHALMIC RELATED) IMPLANT
WATER STERILE IRR 250ML POUR (IV SOLUTION) ×2 IMPLANT

## 2013-12-11 NOTE — Anesthesia Preprocedure Evaluation (Signed)
Anesthesia Evaluation  Patient identified by MRN, date of birth, ID band Patient awake    Reviewed: Allergy & Precautions, H&P , NPO status , Patient's Chart, lab work & pertinent test results  Airway Mallampati: II TM Distance: >3 FB     Dental  (+) Edentulous Upper and Edentulous Lower   Pulmonary COPD oxygen dependent, former smoker,  breath sounds clear to auscultation        Cardiovascular hypertension, Pt. on medications Rhythm:Regular Rate:Normal     Neuro/Psych PSYCHIATRIC DISORDERS Anxiety    GI/Hepatic   Endo/Other    Renal/GU      Musculoskeletal  (+) Arthritis -, Rheumatoid disorders,    Abdominal   Peds  Hematology   Anesthesia Other Findings   Reproductive/Obstetrics                           Anesthesia Physical Anesthesia Plan  ASA: III  Anesthesia Plan: MAC   Post-op Pain Management:    Induction: Intravenous  Airway Management Planned: Nasal Cannula  Additional Equipment:   Intra-op Plan:   Post-operative Plan:   Informed Consent: I have reviewed the patients History and Physical, chart, labs and discussed the procedure including the risks, benefits and alternatives for the proposed anesthesia with the patient or authorized representative who has indicated his/her understanding and acceptance.     Plan Discussed with:   Anesthesia Plan Comments:         Anesthesia Quick Evaluation  

## 2013-12-11 NOTE — Anesthesia Postprocedure Evaluation (Signed)
  Anesthesia Post-op Note  Patient: [name] [name]  Procedure(s) Performed: Procedure(s) with comments: CATARACT EXTRACTION PHACO AND INTRAOCULAR LENS PLACEMENT (IOC) (Left) - CDE:7.94  Patient Location: Short Stay  Anesthesia Type:MAC  Level of Consciousness: awake, alert , oriented and patient cooperative  Airway and Oxygen Therapy: Patient Spontanous Breathing  Post-op Pain: none  Post-op Assessment: Post-op Vital signs reviewed, Patient's Cardiovascular Status Stable, Respiratory Function Stable, Patent Airway and Pain level controlled  Post-op Vital Signs: Reviewed and stable  Complications: No apparent anesthesia complications

## 2013-12-11 NOTE — Op Note (Signed)
Patient brought to the operating room and prepped and draped in the usual manner.  Lid speculum inserted in left eye.  Stab incision made at the twelve o'clock position.  Provisc instilled in the anterior chamber.   A 2.4 mm. Stab incision was made temporally.  An anterior capsulotomy was done with a bent 25 gauge needle.  The nucleus was hydrodissected.  The Phaco tip was inserted in the anterior chamber and the nucleus was emulsified.  CDE was 7.94.  The cortical material was then removed with the I and A tip.  Posterior capsule was the polished.  The anterior chamber was deepened with Provisc.  A 24.0 Diopter Rayner 570C IOL was then inserted in the capsular bag.  Provisc was then removed with the I and A tip.  The wound was then hydrated.  Patient sent to the Recovery Room in good condition with follow up in my office.  Preoperative Diagnosis:  Nuclear Cataract OS Postoperative Diagnosis:  Same Procedure name: Kelman Phacoemulsification OS with IOL

## 2013-12-11 NOTE — Discharge Instructions (Signed)
[name] [name]  12/11/2013           [facility] Instructions [address] [place] [address]      1. Avoid closing eyes tightly. One often closes the eye tightly when laughing, talking, sneezing, coughing or if they feel irritated. At these times, you should be careful not to close your eyes tightly.  2. Instill eye drops as instructed. To instill drops in your eye, open it, look up and have someone gently pull the lower lid down and instill a couple of drops inside the lower lid.  3. Do not touch upper lid.  4. Take Advil or Tylenol for pain.  5. You may use either eye for near work, such as reading or sewing and you may watch television.  6. You may have your hair done at the beauty parlor at any time.  7. Wear dark glasses with or without your own glasses if you are in bright light.  8. Call our office at [phone] or [phone] if you have sharp pain in your eye or unusual symptoms.  9. Do not be concerned because vision in the operative eye is not good. It will not be good, no matter how successful the operation, until you get a special lens for it. Your old glasses will not be suited to the new eye that was operated on and you will not be ready for a new lens for about a month.  10. Follow up at the [facility] office.    I have received a copy of the above instructions and will follow them.      Follow up today with Dr. Nile Riggs at 2:30 pm

## 2013-12-11 NOTE — Transfer of Care (Signed)
Immediate Anesthesia Transfer of Care Note  Patient: [name] [name]  Procedure(s) Performed: Procedure(s) with comments: CATARACT EXTRACTION PHACO AND INTRAOCULAR LENS PLACEMENT (IOC) (Left) - CDE:7.94  Patient Location: Short Stay  Anesthesia Type:MAC  Level of Consciousness: awake, alert , oriented and patient cooperative  Airway & Oxygen Therapy: Patient Spontanous Breathing  Post-op Assessment: Report given to PACU RN, Post -op Vital signs reviewed and stable and Patient moving all extremities  Post vital signs: Reviewed and stable  Complications: No apparent anesthesia complications

## 2013-12-11 NOTE — H&P (Signed)
The patient was re examined and there is no change in the patients condition since the original H and P. 

## 2013-12-12 ENCOUNTER — Encounter (HOSPITAL_COMMUNITY): Payer: Self-pay | Admitting: Ophthalmology

## 2014-01-18 ENCOUNTER — Other Ambulatory Visit (HOSPITAL_COMMUNITY): Payer: Self-pay | Admitting: Family Medicine

## 2014-01-18 DIAGNOSIS — Z1231 Encounter for screening mammogram for malignant neoplasm of breast: Secondary | ICD-10-CM

## 2014-02-11 ENCOUNTER — Ambulatory Visit (HOSPITAL_COMMUNITY)
Admission: RE | Admit: 2014-02-11 | Discharge: 2014-02-11 | Disposition: A | Discharge status: Home / Self Care | Payer: Medicare Other | Source: Ambulatory Visit | Attending: Family Medicine | Admitting: Family Medicine

## 2014-02-11 DIAGNOSIS — Z1231 Encounter for screening mammogram for malignant neoplasm of breast: Secondary | ICD-10-CM | POA: Insufficient documentation

## 2015-02-26 ENCOUNTER — Other Ambulatory Visit (HOSPITAL_COMMUNITY): Payer: Self-pay | Admitting: Family Medicine

## 2015-02-26 DIAGNOSIS — Z1231 Encounter for screening mammogram for malignant neoplasm of breast: Secondary | ICD-10-CM

## 2015-03-27 ENCOUNTER — Ambulatory Visit (HOSPITAL_COMMUNITY): Payer: Medicare Other

## 2015-04-03 ENCOUNTER — Ambulatory Visit (HOSPITAL_COMMUNITY)
Admission: RE | Admit: 2015-04-03 | Discharge: 2015-04-03 | Disposition: A | Discharge status: Home / Self Care | Payer: Medicare Other | Source: Ambulatory Visit | Attending: Family Medicine | Admitting: Family Medicine

## 2015-04-03 DIAGNOSIS — Z1231 Encounter for screening mammogram for malignant neoplasm of breast: Secondary | ICD-10-CM | POA: Diagnosis present

## 2016-02-24 ENCOUNTER — Other Ambulatory Visit (HOSPITAL_COMMUNITY): Payer: Self-pay | Admitting: Family Medicine

## 2016-02-24 DIAGNOSIS — Z1231 Encounter for screening mammogram for malignant neoplasm of breast: Secondary | ICD-10-CM

## 2016-03-24 ENCOUNTER — Other Ambulatory Visit (HOSPITAL_COMMUNITY): Payer: Self-pay | Admitting: Internal Medicine

## 2016-03-24 DIAGNOSIS — M8580 Other specified disorders of bone density and structure, unspecified site: Secondary | ICD-10-CM

## 2016-03-31 ENCOUNTER — Other Ambulatory Visit (HOSPITAL_COMMUNITY): Payer: Medicare Other

## 2016-04-05 ENCOUNTER — Ambulatory Visit (HOSPITAL_COMMUNITY)
Admission: RE | Admit: 2016-04-05 | Discharge: 2016-04-05 | Disposition: A | Discharge status: Home / Self Care | Payer: Medicare Other | Source: Ambulatory Visit | Attending: Family Medicine | Admitting: Family Medicine

## 2016-04-05 DIAGNOSIS — Z1231 Encounter for screening mammogram for malignant neoplasm of breast: Secondary | ICD-10-CM | POA: Insufficient documentation

## 2016-04-21 ENCOUNTER — Ambulatory Visit (HOSPITAL_COMMUNITY)
Admission: RE | Admit: 2016-04-21 | Discharge: 2016-04-21 | Disposition: A | Discharge status: Home / Self Care | Payer: Medicare Other | Source: Ambulatory Visit | Attending: Internal Medicine | Admitting: Internal Medicine

## 2016-04-21 DIAGNOSIS — M8580 Other specified disorders of bone density and structure, unspecified site: Secondary | ICD-10-CM

## 2016-04-21 DIAGNOSIS — M0609 Rheumatoid arthritis without rheumatoid factor, multiple sites: Secondary | ICD-10-CM | POA: Insufficient documentation

## 2016-04-21 DIAGNOSIS — M810 Age-related osteoporosis without current pathological fracture: Secondary | ICD-10-CM | POA: Diagnosis not present

## 2017-01-11 ENCOUNTER — Emergency Department (HOSPITAL_COMMUNITY): Payer: Medicare HMO

## 2017-01-11 ENCOUNTER — Emergency Department (HOSPITAL_COMMUNITY)
Admission: EM | Admit: 2017-01-11 | Discharge: 2017-01-11 | Disposition: A | Discharge status: Home / Self Care | Payer: Medicare HMO | Attending: Emergency Medicine | Admitting: Emergency Medicine

## 2017-01-11 ENCOUNTER — Encounter (HOSPITAL_COMMUNITY): Payer: Self-pay | Admitting: Emergency Medicine

## 2017-01-11 DIAGNOSIS — R1031 Right lower quadrant pain: Secondary | ICD-10-CM | POA: Diagnosis present

## 2017-01-11 DIAGNOSIS — J449 Chronic obstructive pulmonary disease, unspecified: Secondary | ICD-10-CM | POA: Insufficient documentation

## 2017-01-11 DIAGNOSIS — Z79899 Other long term (current) drug therapy: Secondary | ICD-10-CM | POA: Insufficient documentation

## 2017-01-11 DIAGNOSIS — K4090 Unilateral inguinal hernia, without obstruction or gangrene, not specified as recurrent: Secondary | ICD-10-CM | POA: Diagnosis not present

## 2017-01-11 DIAGNOSIS — I10 Essential (primary) hypertension: Secondary | ICD-10-CM | POA: Insufficient documentation

## 2017-01-11 DIAGNOSIS — Z87891 Personal history of nicotine dependence: Secondary | ICD-10-CM | POA: Diagnosis not present

## 2017-01-11 LAB — CBC
HEMATOCRIT: 35.5 % — AB (ref 36.0–46.0)
Hemoglobin: 12.1 g/dL (ref 12.0–15.0)
MCH: 29.2 pg (ref 26.0–34.0)
MCHC: 34.1 g/dL (ref 30.0–36.0)
MCV: 85.5 fL (ref 78.0–100.0)
PLATELETS: 211 10*3/uL (ref 150–400)
RBC: 4.15 MIL/uL (ref 3.87–5.11)
RDW: 13.7 % (ref 11.5–15.5)
WBC: 4.6 10*3/uL (ref 4.0–10.5)

## 2017-01-11 LAB — LIPASE, BLOOD: Lipase: 19 U/L (ref 11–51)

## 2017-01-11 LAB — COMPREHENSIVE METABOLIC PANEL
ALBUMIN: 4 g/dL (ref 3.5–5.0)
ALK PHOS: 50 U/L (ref 38–126)
ALT: 16 U/L (ref 14–54)
AST: 22 U/L (ref 15–41)
Anion gap: 10 (ref 5–15)
BILIRUBIN TOTAL: 0.5 mg/dL (ref 0.3–1.2)
BUN: 21 mg/dL — AB (ref 6–20)
CO2: 29 mmol/L (ref 22–32)
CREATININE: 0.95 mg/dL (ref 0.44–1.00)
Calcium: 9.7 mg/dL (ref 8.9–10.3)
Chloride: 91 mmol/L — ABNORMAL LOW (ref 101–111)
GFR calc Af Amer: >60 mL/min (ref >60)
GFR, EST NON AFRICAN AMERICAN: 54 mL/min — AB (ref >60)
GLUCOSE: 146 mg/dL — AB (ref 65–99)
POTASSIUM: 3.6 mmol/L (ref 3.5–5.1)
Sodium: 130 mmol/L — ABNORMAL LOW (ref 135–145)
TOTAL PROTEIN: 7.1 g/dL (ref 6.5–8.1)

## 2017-01-11 LAB — URINALYSIS, ROUTINE W REFLEX MICROSCOPIC
BACTERIA UA: NONE SEEN
BILIRUBIN URINE: NEGATIVE
GLUCOSE, UA: NEGATIVE mg/dL
Hgb urine dipstick: NEGATIVE
Ketones, ur: NEGATIVE mg/dL
LEUKOCYTES UA: NEGATIVE
NITRITE: NEGATIVE
PH: 6 (ref 5.0–8.0)
Protein, ur: NEGATIVE mg/dL
SPECIFIC GRAVITY, URINE: 1.013 (ref 1.005–1.030)

## 2017-01-11 MED ORDER — SENNOSIDES-DOCUSATE SODIUM 8.6-50 MG PO TABS: 1.0000 | ORAL_TABLET | Freq: Every day | ORAL | 0 refills | Status: DC

## 2017-01-11 MED ORDER — ONDANSETRON HCL 4 MG PO TABS: 4.0000 mg | ORAL_TABLET | Freq: Four times a day (QID) | ORAL | 0 refills | Status: DC

## 2017-01-11 MED ORDER — FENTANYL CITRATE (PF) 100 MCG/2ML IJ SOLN
50.0000 ug | Freq: Once | INTRAMUSCULAR | Status: AC
  Administered 2017-01-11: 50 ug via INTRAVENOUS
  Filled 2017-01-11: qty 2

## 2017-01-11 MED ORDER — IOPAMIDOL (ISOVUE-300) INJECTION 61%
100.0000 mL | Freq: Once | INTRAVENOUS | Status: AC | PRN
  Administered 2017-01-11: 100 mL via INTRAVENOUS

## 2017-01-11 MED ORDER — SODIUM CHLORIDE 0.9 % IV BOLUS (SEPSIS)
500.0000 mL | Freq: Once | INTRAVENOUS | Status: AC
  Administered 2017-01-11: 500 mL via INTRAVENOUS

## 2017-01-11 MED ORDER — OXYCODONE-ACETAMINOPHEN 5-325 MG PO TABS: 1.0000 | ORAL_TABLET | ORAL | 0 refills | Status: DC | PRN

## 2017-01-11 MED ORDER — ONDANSETRON HCL 4 MG/2ML IJ SOLN
4.0000 mg | Freq: Once | INTRAMUSCULAR | Status: AC
  Administered 2017-01-11: 4 mg via INTRAVENOUS
  Filled 2017-01-11: qty 2

## 2017-01-11 MED ORDER — IOPAMIDOL (ISOVUE-300) INJECTION 61%
INTRAVENOUS | Status: AC
  Filled 2017-01-11: qty 30

## 2017-01-11 NOTE — Discharge Instructions (Signed)
As we discussed, your suffering from an inguinal hernia.  Please read through the included discharge instructions.  Although you do not need urgent or emergent surgery, it is very important that you follow-up with a surgeon as indicated in these documents to discuss definitive treatment of this condition with a surgical procedure. ° °Please apply pressure to the affected area with your hand when you are getting ready to cough, sneeze, get out of bed, or otherwise strain.  This may help prevent the hernia from "popping out".  He should also look at your local pharmacy to see if they have a binder or other holster-like device that applies constant pressure to the area. ° °Take regular over-the-counter pain medicine as indicated on the labels and any prescriptions provided today.  Follow-up as indicated in these documents.  Return to the emergency department if he develop new or worsening pain or if you are concerned that the hernia has come out and is stuck, resulting in nausea and vomiting, inability to have bowel movement or passed gas, fever or chills, or other symptoms that concern you. ° ° °Hernia °A hernia occurs when an internal organ pushes out through a weak spot in the abdominal wall. Hernias most commonly occur in the groin and around the navel. Hernias often can be pushed back into place (reduced). Most hernias tend to get worse over time. Some abdominal hernias can get stuck in the opening (irreducible or incarcerated hernia) and cannot be reduced. An irreducible abdominal hernia which is tightly squeezed into the opening is at risk for impaired blood supply (strangulated hernia). A strangulated hernia is a medical emergency. Because of the risk for an irreducible or strangulated hernia, surgery may be recommended to repair a hernia. °CAUSES  °Heavy lifting. °Prolonged coughing. °Straining to have a bowel movement. °A cut (incision) made during an abdominal surgery. °HOME CARE INSTRUCTIONS  °Bed rest is  not required. You may continue your normal activities. °Avoid lifting more than 10 pounds (4.5 kg) or straining. °Cough gently. If you are a smoker it is best to stop. Even the best hernia repair can break down with the continual strain of coughing. Even if you do not have your hernia repaired, a cough will continue to aggravate the problem. °Do not wear anything tight over your hernia. Do not try to keep it in with an outside bandage or truss. These can damage abdominal contents if they are trapped within the hernia sac. °Eat a normal diet. °Avoid constipation. Straining over Sheena Simonis periods of time will increase hernia size and encourage breakdown of repairs. If you cannot do this with diet alone, stool softeners may be used. °SEEK IMMEDIATE MEDICAL CARE IF:  °You have a fever. °You develop increasing abdominal pain. °You feel nauseous or vomit. °Your hernia is stuck outside the abdomen, looks discolored, feels hard, or is tender. °You have any changes in your bowel habits or in the hernia that are unusual for you. °You have increased pain or swelling around the hernia. °You cannot push the hernia back in place by applying gentle pressure while lying down. °MAKE SURE YOU:  °Understand these instructions. °Will watch your condition. °Will get help right away if you are not doing well or get worse. °Document Released: 11/01/2005 Document Revised: 01/24/2012 Document Reviewed: 06/20/2008 °[employer]® Patient Information ©2015 [employer]. This information is not intended to replace advice given to you by your health care provider. Make sure you discuss any questions you have with your health care provider. ° °

## 2017-01-11 NOTE — ED Triage Notes (Signed)
Rt groin pain since this morning

## 2017-01-11 NOTE — ED Provider Notes (Signed)
Emergency Department Provider Note   I have reviewed the triage vital signs and the nursing notes.   HISTORY  Chief Complaint Abdominal Pain   HPI [name] [name] is a 83 y.o. female with PMH of COPD and HTN presents to the emergency department for evaluation of sudden onset right inguinal pain. She states that she got up and was going to her normal morning activities when she had sudden onset of pain in this area. She describes it as sharp, severe, nonradiating pain. She's had one to 2 weeks of intermittent diarrhea that unusual for her but no pain until this morning. She denies noticing any bulge or swelling in the area of pain no history of hernia. No vaginal bleeding or discharge. No dysuria. She has no associated chest pain or difficulty breathing. Patient has had an appendectomy in the past. Pain is worse with movement. No alleviating factors. She reports her morphine allergy was vomiting only.    Past Medical History:  Diagnosis Date  . Anxiety   . Arthritis    rheumatoid  . COPD (chronic obstructive pulmonary disease) (HCC)   . Hypertension     There are no active problems to display for this patient.   Past Surgical History:  Procedure Laterality Date  . APPENDECTOMY    . CATARACT EXTRACTION W/PHACO Right 11/27/2013   Procedure: CATARACT EXTRACTION PHACO AND INTRAOCULAR LENS PLACEMENT (IOC);  Surgeon: Loraine Leriche T. Nile Riggs, MD;  Location: [facility];  Service: Ophthalmology;  Laterality: Right;  CDE:7.31  . CATARACT EXTRACTION W/PHACO Left 12/11/2013   Procedure: CATARACT EXTRACTION PHACO AND INTRAOCULAR LENS PLACEMENT (IOC);  Surgeon: Loraine Leriche T. Nile Riggs, MD;  Location: [facility];  Service: Ophthalmology;  Laterality: Left;  CDE:7.94  . HUMERUS FRACTURE SURGERY Left    had surgery x3 on it.  . REPLACEMENT TOTAL KNEE BILATERAL      Current Outpatient Rx  . Order #: [id number] Class: Historical Med  . Order #: [id number] Class: Historical Med  . Order #: [id number] Class: Historical Med    . Order #: [id number] Class: Historical Med  . Order #: [id number] Class: Historical Med  . Order #: [id number] Class: Historical Med  . Order #: [id number] Class: Historical Med  . Order #: [id number] Class: Historical Med  . Order #: [id number] Class: Historical Med  . Order #: [id number] Class: Historical Med  . Order #: [id number] Class: Historical Med  . Order #: [id number] Class: Print  . Order #: [id number] Class: Print  . Order #: [id number] Class: Print    Allergies Cortisone; Morphine and related; Sulfa antibiotics; Vicodin [hydrocodone-acetaminophen]; and Penicillins  No family history on file.  Social History Social History  Substance Use Topics  . Smoking status: Former Smoker    Packs/day: 2.00    Years: 30.00    Types: Cigarettes  . Smokeless tobacco: Former Neurosurgeon    Quit date: 11/22/1997  . Alcohol use Yes     Comment: 1 mixed drink at night    Review of Systems  Constitutional: No fever/chills Eyes: No visual changes. ENT: No sore throat. Cardiovascular: Denies chest pain. Respiratory: Denies shortness of breath. Gastrointestinal: Positive RLQ abdominal pain. No nausea, no vomiting.  No diarrhea.  No constipation. Genitourinary: Negative for dysuria. Musculoskeletal: Negative for back pain. Skin: Negative for rash. Neurological: Negative for headaches, focal weakness or numbness.  10-point ROS otherwise negative.  ____________________________________________   PHYSICAL EXAM:  VITAL SIGNS: ED Triage Vitals  Enc Vitals Group     BP 01/11/17 1532 (!) 216/105     Pulse Rate 01/11/17 1532 90  Resp 01/11/17 1532 16     Temp 01/11/17 1532 99.5 F (37.5 C)     Temp Source 01/11/17 1532 Oral     SpO2 01/11/17 1532 98 %     Weight 01/11/17 1532 109 lb (49.4 kg)     Height 01/11/17 1532 5\' 2"  (1.575 m)     Pain Score 01/11/17 1530 10   Constitutional: Alert and oriented. Appears uncomfortable but in no acute distress. Eyes: Conjunctivae are normal.  Head:  Atraumatic. Nose: No congestion/rhinnorhea. Mouth/Throat: Mucous membranes are moist.  Oropharynx non-erythematous. Neck: No stridor.  Cardiovascular: Normal rate, regular rhythm. Good peripheral circulation. Grossly normal heart sounds.   Respiratory: Normal respiratory effort.  No retractions. Lungs CTAB. Gastrointestinal: Soft with mild right inguinal tenderness. No mass or increased pain with valsalva. No distention. No rebound tenderness or guarding.  Musculoskeletal: No lower extremity tenderness nor edema. No gross deformities of extremities. Neurologic:  Normal speech and language. No gross focal neurologic deficits are appreciated.  Skin:  Skin is warm, dry and intact. No rash noted. Psychiatric: Mood and affect are normal. Speech and behavior are normal.  ____________________________________________   LABS (all labs ordered are listed, but only abnormal results are displayed)  Labs Reviewed  COMPREHENSIVE METABOLIC PANEL - Abnormal; Notable for the following:       Result Value   Sodium 130 (*)    Chloride 91 (*)    Glucose, Bld 146 (*)    BUN 21 (*)    GFR calc non Af Amer 54 (*)    All other components within normal limits  CBC - Abnormal; Notable for the following:    HCT 35.5 (*)    All other components within normal limits  LIPASE, BLOOD  URINALYSIS, ROUTINE W REFLEX MICROSCOPIC   ____________________________________________  RADIOLOGY  Ct Abdomen Pelvis W Contrast  Result Date: 01/11/2017 CLINICAL DATA:  Right lower quadrant pain starting this morning. Appendectomy. EXAM: CT ABDOMEN AND PELVIS WITH CONTRAST TECHNIQUE: Multidetector CT imaging of the abdomen and pelvis was performed using the standard protocol following bolus administration of intravenous contrast. CONTRAST:  01/13/2017 ISOVUE-300 IOPAMIDOL (ISOVUE-300) INJECTION 61% COMPARISON:  None. FINDINGS: Lower chest: Clear lung bases. Normal heart size without pericardial or pleural effusion. Right coronary  artery atherosclerosis. Minimal motion degradation. Hepatobiliary: Focal steatosis adjacent the falciform ligament. Normal gallbladder, without biliary ductal dilatation. Pancreas: Normal, without mass or ductal dilatation. Spleen: Normal in size, without focal abnormality. Adrenals/Urinary Tract: Mild left adrenal thickening. Normal right adrenal gland. Mild renal cortical thinning bilaterally. No hydronephrosis. Normal urinary bladder. Stomach/Bowel: Normal stomach, without wall thickening. Scattered colonic diverticula. Sigmoid wall thickening is likely related to the muscular hypertrophy. Normal terminal ileum. Normal small bowel. Vascular/Lymphatic: Advanced aortic and branch vessel atherosclerosis. No abdominopelvic adenopathy. Reproductive: Normal uterus and adnexa. Other: No significant free fluid. A fat containing right femoral hernia, including on image 67/series 2 and coronal image 37. Pelvic floor laxity. Musculoskeletal: Remote right inferior pubic ramus fracture. Mild osteopenia. Grade 1 L4-5 anterolisthesis. Degenerative disc disease at this level and the lumbosacral junction. Disc bulges, especially at L2-3, eccentric left. Example image 31/series 2 and image 55/series 5. IMPRESSION: 1.  No acute process in the abdomen or pelvis. 2. Fat containing right femoral hernia, without acute complication. 3.  Coronary artery atherosclerosis. Aortic atherosclerosis. 4. Advanced lumbosacral spondylosis, as above. 5. Pelvic floor laxity. Electronically Signed   By: M.D.   On: 01/11/2017 18:39    ____________________________________________   PROCEDURES  Procedure(s)  performed:   Procedures  None ____________________________________________   INITIAL IMPRESSION / ASSESSMENT AND PLAN / ED COURSE  Pertinent labs & imaging results that were available during my care of the patient were reviewed by me and considered in my medical decision making (see chart for details).  Patient  resents to the emergency department for evaluation of right inguinal pain in the setting of diarrhea for the last week. She has no obvious hernia. No GU symptoms. Possible small inguinal hernia vs colitis. Plan for CT given the patient's age and no history of similar symptoms.   07:18 PM Patient has a small fat-containing right femoral hernia without acute complication and that is likely the source of her discomfort. She is feeling better after pain occasion emergency department. Plan to refer her to outpatient surgery for further evaluation and treatment. We will send her home with oxycodone to use as needed for severe pain. His custom management of hernia pain and return precautions in detail. Discussed movements that may make this worse which she should avoid. Discussed that she will need to not take the Tramadol if deciding to take the Oxycodone. She will discuss this ED presentation with her PCP. Patient reports taking Oxycodone in the past with no major issues. Provided Zofran and Senna in case symptoms return.   At this time, I do not feel there is any life-threatening condition present. I have reviewed and discussed all results (EKG, imaging, lab, urine as appropriate), exam findings with patient. I have reviewed nursing notes and appropriate previous records.  I feel the patient is safe to be discharged home without further emergent workup. Discussed usual and customary return precautions. Patient and family (if present) verbalize understanding and are comfortable with this plan.  Patient will follow-up with their primary care provider. If they do not have a primary care provider, information for follow-up has been provided to them. All questions have been answered.  ____________________________________________  FINAL CLINICAL IMPRESSION(S) / ED DIAGNOSES  Final diagnoses:  Right lower quadrant abdominal pain  Right inguinal hernia     MEDICATIONS GIVEN DURING THIS VISIT:  Medications   iopamidol (ISOVUE-300) 61 % injection (not administered)  sodium chloride 0.9 % bolus 500 mL (0 mLs Intravenous Stopped 01/11/17 1705)  fentaNYL (SUBLIMAZE) injection 50 mcg (50 mcg Intravenous Given 01/11/17 1620)  ondansetron (ZOFRAN) injection 4 mg (4 mg Intravenous Given 01/11/17 1620)  iopamidol (ISOVUE-300) 61 % injection 100 mL (100 mLs Intravenous Contrast Given 01/11/17 1759)     NEW OUTPATIENT MEDICATIONS STARTED DURING THIS VISIT:  New Prescriptions   ONDANSETRON (ZOFRAN) 4 MG TABLET    Take 1 tablet (4 mg total) by mouth every 6 (six) hours.   OXYCODONE-ACETAMINOPHEN (PERCOCET/ROXICET) 5-325 MG TABLET    Take 1 tablet by mouth every 4 (four) hours as needed for severe pain.   SENNA-DOCUSATE (SENOKOT-S) 8.6-50 MG TABLET    Take 1 tablet by mouth daily.      Note:  This document was prepared using Dragon voice recognition software and may include unintentional dictation errors.  Alona Bene, MD Emergency Medicine   Maia Plan, MD 01/11/17 [facility] [place]

## 2017-01-27 ENCOUNTER — Ambulatory Visit (INDEPENDENT_AMBULATORY_CARE_PROVIDER_SITE_OTHER): Payer: Medicare HMO | Admitting: General Surgery

## 2017-01-27 ENCOUNTER — Ambulatory Visit: Payer: Self-pay | Admitting: General Surgery

## 2017-01-27 ENCOUNTER — Encounter: Payer: Self-pay | Admitting: General Surgery

## 2017-01-27 VITALS — BP 132/77 | HR 99 | Temp 96.9°F | Resp 18 | Ht 62.0 in | Wt 110.0 lb

## 2017-01-27 DIAGNOSIS — K4190 Unilateral femoral hernia, without obstruction or gangrene, not specified as recurrent: Secondary | ICD-10-CM | POA: Diagnosis not present

## 2017-01-27 NOTE — Patient Instructions (Signed)
Inguinal Hernia, Adult  An inguinal hernia is when fat or the intestines push through the area where the leg meets the lower belly (groin) and make a rounded lump (bulge). This condition happens over time. There are three types of inguinal hernias. These types include:  · Hernias that can be pushed back into the belly (are reducible).  · Hernias that cannot be pushed back into the belly (are incarcerated).  · Hernias that cannot be pushed back into the belly and lose their blood supply (get strangulated). This type needs emergency surgery.    Follow these instructions at home:  Lifestyle   · Drink enough fluid to keep your urine (pee) clear or pale yellow.  · Eat plenty of fruits, vegetables, and whole grains. These have a lot of fiber. Talk with your doctor if you have questions.  · Avoid lifting heavy objects.  · Avoid standing for long periods of time.  · Do not use tobacco products. These include cigarettes, chewing tobacco, or e-cigarettes. If you need help quitting, ask your doctor.  · Try to stay at a healthy weight.  General instructions   · Do not try to force the hernia back in.  · Watch your hernia for any changes in color or size. Let your doctor know if there are any changes.  · Take over-the-counter and prescription medicines only as told by your doctor.  · Keep all follow-up visits as told by your doctor. This is important.  Contact a doctor if:  · You have a fever.  · You have new symptoms.  · Your symptoms get worse.  Get help right away if:  · The area where the legs meets the lower belly has:  ? Pain that gets worse suddenly.  ? A bulge that gets bigger suddenly and does not go down.  ? A bulge that turns red or purple.  ? A bulge that is painful to the touch.  · You are a man and your scrotum:  ? Suddenly feels painful.  ? Suddenly changes in size.  · You feel sick to your stomach (nauseous) and this feeling does not go away.  · You throw up (vomit) and this keeps happening.  · You feel your  heart beating a lot more quickly than normal.  · You cannot poop (have a bowel movement) or pass gas.  This information is not intended to replace advice given to you by your health care provider. Make sure you discuss any questions you have with your health care provider.  Document Released: 12/02/2006 Document Revised: 04/08/2016 Document Reviewed: 09/11/2014  Elsevier Interactive Patient Education © 2017 [employer]

## 2017-01-27 NOTE — Progress Notes (Signed)
[name] [name]; [id number]; [birth year]   HPI   Patient is an 83 year old white female who was seen in the emergency room on 01/11/2017 for pain in the right groin.  She was found on CT scan of the abdomen and pelvis to have a right femoral hernia only contained fat.  No bile was present.  It was reduced without difficulty.  The patient has had no symptoms since that time.  She denies any nausea or vomiting.  She denies any lump in the right groin region.  She denies any fever or chills.  She has no pain currently.  This was her first episode.  She was referred by the emergency room for further evaluation and treatment. Past Medical History:  Diagnosis Date  . Anxiety   . Arthritis    rheumatoid  . COPD (chronic obstructive pulmonary disease) (HCC)   . Hypertension     Past Surgical History:  Procedure Laterality Date  . APPENDECTOMY    . CATARACT EXTRACTION W/PHACO Right 11/27/2013   Procedure: CATARACT EXTRACTION PHACO AND INTRAOCULAR LENS PLACEMENT (IOC);  Surgeon: Loraine Leriche T. Nile Riggs, MD;  Location: [facility];  Service: Ophthalmology;  Laterality: Right;  CDE:7.31  . CATARACT EXTRACTION W/PHACO Left 12/11/2013   Procedure: CATARACT EXTRACTION PHACO AND INTRAOCULAR LENS PLACEMENT (IOC);  Surgeon: Loraine Leriche T. Nile Riggs, MD;  Location: [facility];  Service: Ophthalmology;  Laterality: Left;  CDE:7.94  . HUMERUS FRACTURE SURGERY Left    had surgery x3 on it.  . REPLACEMENT TOTAL KNEE BILATERAL      No family history on file.  Current Outpatient Prescriptions on File Prior to Visit  Medication Sig Dispense Refill  . albuterol (PROVENTIL HFA;VENTOLIN HFA) 108 (90 BASE) MCG/ACT inhaler Inhale 2 puffs into the lungs every 6 (six) hours as needed for wheezing or shortness of breath.    . ALPRAZolam (XANAX) 0.5 MG tablet Take 0.5 mg by mouth 3 (three) times daily.    . budesonide-formoterol (SYMBICORT) 160-4.5 MCG/ACT inhaler Inhale 1 puff into the lungs 2 (two) times daily.    . cholecalciferol (VITAMIN D)  1000 UNITS tablet Take 1,000 Units by mouth daily.    . clotrimazole-betamethasone (LOTRISONE) cream Apply to affected area twice daily    . docusate sodium (COLACE) 100 MG capsule Take 100 mg by mouth 2 (two) times daily.    . hydrochlorothiazide (HYDRODIURIL) 25 MG tablet Take 25 mg by mouth daily.    Marland Kitchen leflunomide (ARAVA) 20 MG tablet Take 20 mg by mouth daily.    . Multiple Vitamin (MULTIVITAMIN WITH MINERALS) TABS tablet Take 1 tablet by mouth daily.    . ondansetron (ZOFRAN) 4 MG tablet Take 1 tablet (4 mg total) by mouth every 6 (six) hours. 12 tablet 0  . oxyCODONE-acetaminophen (PERCOCET/ROXICET) 5-325 MG tablet Take 1 tablet by mouth every 4 (four) hours as needed for severe pain. 15 tablet 0  . ramipril (ALTACE) 10 MG capsule Take 20 mg by mouth daily.    Marland Kitchen senna-docusate (SENOKOT-S) 8.6-50 MG tablet Take 1 tablet by mouth daily. 30 tablet 0  . traMADol (ULTRAM) 50 MG tablet Take 50 mg by mouth every 6 (six) hours as needed for moderate pain.      No current facility-administered medications on file prior to visit.     Allergies  Allergen Reactions  . Cortisone Other (See Comments)    Irregular Heart Beat  . Morphine And Related Nausea And Vomiting  . Sulfa Antibiotics Hives  . Vicodin [Hydrocodone-Acetaminophen] Nausea And Vomiting  .  Penicillins Hives    Has patient had a PCN reaction causing immediate rash, facial/tongue/throat swelling, SOB or lightheadedness with hypotension: Yes Has patient had a PCN reaction causing severe rash involving mucus membranes or skin necrosis: No Has patient had a PCN reaction that required hospitalization No Has patient had a PCN reaction occurring within the last 10 years: No If all of the above answers are "NO", then may proceed with Cephalosporin use.     History  Alcohol Use  . Yes    Comment: 1 mixed drink at night    History  Smoking Status  . Former Smoker  . Packs/day: 2.00  . Years: 30.00  . Types: Cigarettes   Smokeless Tobacco  . Former Neurosurgeon  . Quit date: 11/22/1997    Review of Systems  Constitutional: Negative.   HENT: Negative.   Eyes: Negative.   Respiratory: Negative for cough and wheezing.        She does wear oxygen in the evening.  Cardiovascular: Negative.   Gastrointestinal: Negative.   Genitourinary: Negative.   Musculoskeletal: Positive for joint pain.  Skin: Negative.   Neurological: Negative.   Endo/Heme/Allergies: Negative.   Psychiatric/Behavioral: Negative.     Objective   Vitals:   01/27/17 1149  BP: 132/77  Pulse: 99  Resp: 18  Temp: (!) 96.9 F (36.1 C)    Physical Exam  Constitutional: She is oriented to person, place, and time and well-developed, well-nourished, and in no distress.  HENT:  Head: Normocephalic and atraumatic.  Neck: Normal range of motion. Neck supple.  Cardiovascular: Normal rate, regular rhythm and normal heart sounds.   No murmur heard. Pulmonary/Chest: Effort normal and breath sounds normal. She has no wheezes. She has no rales.  Abdominal: Soft. Bowel sounds are normal. She exhibits no distension. There is no tenderness. There is no rebound.  I could not palpate a right femoral hernia while patient straining.  Neurological: She is alert and oriented to person, place, and time.  Skin: Skin is warm and dry.  Vitals reviewed.    ER notes reviewed.  CT scan of the abdomen and pelvis reviewed. Assessment    Right femoral hernia, currently asymptomatic and not palpable Plan    patient would like to avoid any surgical intervention at this time, which is fine with me.  The risks and signs of incarceration were explained to the patient.  She will return if she develops the right femoral hernia again.  The benefits for reducing this was explained to the patient.  She was instructed to go to the emergency room should she not be able to reduce it on her own.

## 2017-04-19 ENCOUNTER — Ambulatory Visit (INDEPENDENT_AMBULATORY_CARE_PROVIDER_SITE_OTHER): Payer: Medicare HMO | Admitting: Orthopedic Surgery

## 2017-04-19 ENCOUNTER — Ambulatory Visit (INDEPENDENT_AMBULATORY_CARE_PROVIDER_SITE_OTHER): Payer: Medicare HMO

## 2017-04-19 ENCOUNTER — Encounter: Payer: Self-pay | Admitting: Orthopedic Surgery

## 2017-04-19 VITALS — BP 131/66 | HR 79 | Ht 61.0 in | Wt 107.0 lb

## 2017-04-19 DIAGNOSIS — M25561 Pain in right knee: Secondary | ICD-10-CM | POA: Diagnosis not present

## 2017-04-19 DIAGNOSIS — G8929 Other chronic pain: Secondary | ICD-10-CM | POA: Diagnosis not present

## 2017-04-19 DIAGNOSIS — Z96651 Presence of right artificial knee joint: Secondary | ICD-10-CM | POA: Diagnosis not present

## 2017-04-19 NOTE — Progress Notes (Signed)
NEW PATIENT OFFICE VISIT    Chief Complaint  Patient presents with  . Knee Pain    Right    This is an 83 year old female who had bilateral total knee replacements in [place]. The right total knee was done in 2003 the left in 2005. She recovered very well from both surgeries. Her daughter is a Engineer, civil (consulting) and said she was a model patient  She is presenting to [place] with 3 weeks of pain in her right knee which came on spontaneously without any trauma. The pain lasted 3 weeks and then spontaneously resolved. She says at times with increased activity she will have some increasing knee pain. Most of this pain was in the back of the knee. She denies any catching locking giving way or effusions. The pain was mild ventricular night when he became moderate to severe it was dull. It has not affected her activity level as she went golfing on Sunday    Review of Systems  Constitutional: Negative for chills, fever, malaise/fatigue and weight loss.  Cardiovascular: Negative for chest pain.  Musculoskeletal: Positive for joint pain.  Skin: Negative for itching and rash.    Past Medical History:  Diagnosis Date  . Anxiety   . Arthritis    rheumatoid  . COPD (chronic obstructive pulmonary disease) (HCC)   . Hypertension     Past Surgical History:  Procedure Laterality Date  . APPENDECTOMY    . CATARACT EXTRACTION W/PHACO Right 11/27/2013   Procedure: CATARACT EXTRACTION PHACO AND INTRAOCULAR LENS PLACEMENT (IOC);  Surgeon: Loraine Leriche T. Nile Riggs, MD;  Location: [facility];  Service: Ophthalmology;  Laterality: Right;  CDE:7.31  . CATARACT EXTRACTION W/PHACO Left 12/11/2013   Procedure: CATARACT EXTRACTION PHACO AND INTRAOCULAR LENS PLACEMENT (IOC);  Surgeon: Loraine Leriche T. Nile Riggs, MD;  Location: [facility];  Service: Ophthalmology;  Laterality: Left;  CDE:7.94  . HUMERUS FRACTURE SURGERY Left    had surgery x3 on it.  . REPLACEMENT TOTAL KNEE BILATERAL      No family history on file. Social History  Substance Use  Topics  . Smoking status: Former Smoker    Packs/day: 2.00    Years: 30.00    Types: Cigarettes  . Smokeless tobacco: Former Neurosurgeon    Quit date: 11/22/1997  . Alcohol use Yes     Comment: 1 mixed drink at night    BP 131/66   Pulse 79   Ht 5\' 1"  (1.549 m)   Wt 107 lb (48.5 kg)   BMI 20.22 kg/m   Physical Exam The patient is very thin she is otherwise normal development grooming and hygiene She is oriented to person place and time Her mood is pleasant her affect is normal She walks without any assistive devices and there is no limp noted  The left lower extremity has a well-healed anterior skin incision there is no tenderness or swelling the flexion arc is 115 the knee is stable in extension as well as flexion muscle tone and strength are normal she has excellent straight leg raise Distally her pulses are intact there is no edema. Normal sensation in the left lower extremity  The right lower extremity also has a well-healed anterior total knee incision there isswelling or effusion. Knee flexion is about 105 there appears to be a posterior sag that there is no laxity in extension or flexion strength tests are normal including a good straight leg raise  She has normal sensation in a good pulse without edema Ortho Exam  No orders of the  defined types were placed in this encounter.  Plain films show a total knee replacement with no signs of loosening. The patella cut appears to be oblique but the overall patella is centered in the trochlear groove at 45 of flexion  The femoral component shows that it may be a little oversized but nothing that would cause any difficulty or problem    Encounter Diagnosis  Name Primary?  . Chronic pain of right knee Yes     PLAN:   This is a total knee that looks good feels good except the patient had 3 weeks of pain unexplained  Recommend follow-up with [place] if any recurrence of symptoms

## 2018-04-14 ENCOUNTER — Ambulatory Visit
Admission: RE | Admit: 2018-04-14 | Discharge: 2018-04-14 | Disposition: A | Discharge status: Home / Self Care | Payer: Medicare HMO | Source: Ambulatory Visit | Attending: Physician Assistant | Admitting: Physician Assistant

## 2018-04-14 ENCOUNTER — Other Ambulatory Visit: Payer: Self-pay | Admitting: Physician Assistant

## 2018-04-14 DIAGNOSIS — M25551 Pain in right hip: Secondary | ICD-10-CM

## 2018-04-25 ENCOUNTER — Emergency Department (HOSPITAL_COMMUNITY): Payer: Medicare HMO

## 2018-04-25 ENCOUNTER — Encounter (HOSPITAL_COMMUNITY): Payer: Self-pay | Admitting: Emergency Medicine

## 2018-04-25 ENCOUNTER — Other Ambulatory Visit: Payer: Self-pay

## 2018-04-25 ENCOUNTER — Emergency Department (HOSPITAL_COMMUNITY)
Admission: EM | Admit: 2018-04-25 | Discharge: 2018-04-25 | Disposition: A | Discharge status: Home / Self Care | Payer: Medicare HMO | Attending: Emergency Medicine | Admitting: Emergency Medicine

## 2018-04-25 DIAGNOSIS — E876 Hypokalemia: Secondary | ICD-10-CM | POA: Insufficient documentation

## 2018-04-25 DIAGNOSIS — R55 Syncope and collapse: Secondary | ICD-10-CM | POA: Diagnosis not present

## 2018-04-25 DIAGNOSIS — R42 Dizziness and giddiness: Secondary | ICD-10-CM | POA: Insufficient documentation

## 2018-04-25 DIAGNOSIS — Z79899 Other long term (current) drug therapy: Secondary | ICD-10-CM | POA: Insufficient documentation

## 2018-04-25 DIAGNOSIS — J449 Chronic obstructive pulmonary disease, unspecified: Secondary | ICD-10-CM | POA: Diagnosis not present

## 2018-04-25 DIAGNOSIS — I10 Essential (primary) hypertension: Secondary | ICD-10-CM | POA: Diagnosis not present

## 2018-04-25 DIAGNOSIS — Z87891 Personal history of nicotine dependence: Secondary | ICD-10-CM | POA: Diagnosis not present

## 2018-04-25 LAB — CBC
HCT: 39 % (ref 36.0–46.0)
Hemoglobin: 12.9 g/dL (ref 12.0–15.0)
MCH: 29.5 pg (ref 26.0–34.0)
MCHC: 33.1 g/dL (ref 30.0–36.0)
MCV: 89.2 fL (ref 78.0–100.0)
PLATELETS: 370 10*3/uL (ref 150–400)
RBC: 4.37 MIL/uL (ref 3.87–5.11)
RDW: 14.3 % (ref 11.5–15.5)
WBC: 11.5 10*3/uL — AB (ref 4.0–10.5)

## 2018-04-25 LAB — BASIC METABOLIC PANEL
Anion gap: 13 (ref 5–15)
BUN: 26 mg/dL — AB (ref 6–20)
CALCIUM: 9.5 mg/dL (ref 8.9–10.3)
CO2: 31 mmol/L (ref 22–32)
CREATININE: 1.06 mg/dL — AB (ref 0.44–1.00)
Chloride: 89 mmol/L — ABNORMAL LOW (ref 101–111)
GFR calc non Af Amer: 47 mL/min — ABNORMAL LOW (ref >60)
GFR, EST AFRICAN AMERICAN: 55 mL/min — AB (ref >60)
Glucose, Bld: 92 mg/dL (ref 65–99)
Potassium: 3 mmol/L — ABNORMAL LOW (ref 3.5–5.1)
SODIUM: 133 mmol/L — AB (ref 135–145)

## 2018-04-25 LAB — URINALYSIS, ROUTINE W REFLEX MICROSCOPIC
Bilirubin Urine: NEGATIVE
GLUCOSE, UA: NEGATIVE mg/dL
HGB URINE DIPSTICK: NEGATIVE
KETONES UR: NEGATIVE mg/dL
Leukocytes, UA: NEGATIVE
Nitrite: NEGATIVE
Protein, ur: NEGATIVE mg/dL
Specific Gravity, Urine: 1.016 (ref 1.005–1.030)
pH: 7 (ref 5.0–8.0)

## 2018-04-25 LAB — CBG MONITORING, ED: GLUCOSE-CAPILLARY: 98 mg/dL (ref 65–99)

## 2018-04-25 MED ORDER — SODIUM CHLORIDE 0.9 % IV SOLN
INTRAVENOUS | Status: DC
  Administered 2018-04-25: 12:00:00 via INTRAVENOUS

## 2018-04-25 MED ORDER — POTASSIUM CHLORIDE 20 MEQ PO PACK
40.0000 meq | PACK | Freq: Once | ORAL | Status: AC
  Administered 2018-04-25: 40 meq via ORAL
  Filled 2018-04-25: qty 2

## 2018-04-25 NOTE — ED Notes (Signed)
patient back from radiology

## 2018-04-25 NOTE — Discharge Instructions (Signed)
Follow-up with your primary care doctor to discuss further evaluation such as outpatient cardiac monitoring.  Return to the emergency room if you have any recurrent episodes.

## 2018-04-25 NOTE — ED Provider Notes (Signed)
[facility] EMERGENCY DEPARTMENT Provider Note   CSN: [id number] Arrival date & time: 04/25/18  1141     History   Chief Complaint Chief Complaint  Patient presents with  . Loss of Consciousness    HPI [name] [name] is a 84 y.o. female.  HPI Patient presents to the emergency room for evaluation after a syncopal episode.  Patient states that couple weeks ago she injured her right hip.  The x-rays were negative but she is had some pain still.  Today was the first day she went out shopping using a walker since that previous fall.  Patient states she started to feel little bit anxious while she was walking.  She told her daughter she was getting very lightheaded and thought she might pass out.  Patient's daughter attempted to try to get her to walk to a chair.  Patient's daughter states the patient's eyes started to look a little glassy.  She never completely lost consciousness but she started to slump forward.  Daughter helped her so she did not fall.  She seemed to be dragging her left leg slightly.  This episode lasted a few minutes.  They called 911 and the patient was able to stand up and sit on the gurney.  She was brought to the ED.  Patient has had a few episodes of diarrhea the last couple of days.  She denies any trouble with chest pain.  No abdominal pain.  No shortness of breath.  No headache.  No trouble with her speech.  No focal numbness or weakness.  She denies any previous history of syncopal episodes. Past Medical History:  Diagnosis Date  . Anxiety   . Arthritis    rheumatoid  . COPD (chronic obstructive pulmonary disease) (HCC)   . Hypertension     There are no active problems to display for this patient.   Past Surgical History:  Procedure Laterality Date  . APPENDECTOMY    . CATARACT EXTRACTION W/PHACO Right 11/27/2013   Procedure: CATARACT EXTRACTION PHACO AND INTRAOCULAR LENS PLACEMENT (IOC);  Surgeon: Loraine Leriche T. Nile Riggs, MD;  Location: [facility];  Service:  Ophthalmology;  Laterality: Right;  CDE:7.31  . CATARACT EXTRACTION W/PHACO Left 12/11/2013   Procedure: CATARACT EXTRACTION PHACO AND INTRAOCULAR LENS PLACEMENT (IOC);  Surgeon: Loraine Leriche T. Nile Riggs, MD;  Location: [facility];  Service: Ophthalmology;  Laterality: Left;  CDE:7.94  . HUMERUS FRACTURE SURGERY Left    had surgery x3 on it.  . REPLACEMENT TOTAL KNEE BILATERAL       OB History   None      Home Medications    Prior to Admission medications   Medication Sig Start Date End Date Taking? Authorizing Provider  albuterol (PROVENTIL HFA;VENTOLIN HFA) 108 (90 BASE) MCG/ACT inhaler Inhale 2 puffs into the lungs every 6 (six) hours as needed for wheezing or shortness of breath.   Yes [provider]  ALPRAZolam Prudy Feeler) 0.5 MG tablet Take 0.5 mg by mouth 3 (three) times daily.   Yes [provider]  budesonide-formoterol (SYMBICORT) 160-4.5 MCG/ACT inhaler Inhale 2 puffs into the lungs 2 (two) times daily. 01/30/18  Yes [provider]  cholecalciferol (VITAMIN D) 1000 UNITS tablet Take 1,000 Units by mouth daily.   Yes [provider]  clotrimazole-betamethasone (LOTRISONE) cream Apply to affected area twice daily 12/27/16  Yes [provider]  docusate sodium (COLACE) 100 MG capsule Take 100 mg by mouth 2 (two) times daily.   Yes [provider]  fluticasone (  FLONASE) 50 MCG/ACT nasal spray Place 2 sprays into both nostrils daily. 10/12/17  Yes [provider]  hydrochlorothiazide (HYDRODIURIL) 25 MG tablet Take 25 mg by mouth daily.   Yes [provider]  leflunomide (ARAVA) 20 MG tablet Take 20 mg by mouth daily.   Yes [provider]  Multiple Vitamin (MULTIVITAMIN WITH MINERALS) TABS tablet Take 1 tablet by mouth daily.   Yes [provider]  ramipril (ALTACE) 10 MG capsule Take 20 mg by mouth daily.   Yes [provider]  traMADol (ULTRAM) 50 MG tablet Take 50 mg by mouth 4 (four) times daily.     Yes [provider]  predniSONE (DELTASONE) 20 MG tablet Take 20 mg by mouth as directed. Take 1 tablet by mouth three times a day for three days, then take 1 tablet by mouth twice a day for three days, then take 1 tablet by mouth daily for three days, then stop 04/14/18   [provider]    Family History No family history on file.  Social History Social History   Tobacco Use  . Smoking status: Former Smoker    Packs/day: 2.00    Years: 30.00    Pack years: 60.00    Types: Cigarettes  . Smokeless tobacco: Former Neurosurgeon    Quit date: 11/22/1997  Substance Use Topics  . Alcohol use: Yes    Comment: 1 mixed drink at night  . Drug use: No     Allergies   Cortisone; Morphine and related; Motrin [ibuprofen]; Sulfa antibiotics; Vicodin [hydrocodone-acetaminophen]; and Penicillins   Review of Systems Review of Systems  All other systems reviewed and are negative.    Physical Exam Updated Vital Signs SpO2 95%   Physical Exam  Constitutional: She is oriented to person, place, and time.  Thin  HENT:  Head: Normocephalic and atraumatic.  Right Ear: External ear normal.  Left Ear: External ear normal.  Mouth/Throat: Oropharynx is clear and moist.  Eyes: Conjunctivae are normal. Right eye exhibits no discharge. Left eye exhibits no discharge. No scleral icterus.  Neck: Neck supple. No tracheal deviation present.  Cardiovascular: Normal rate, regular rhythm and intact distal pulses.  Pulmonary/Chest: Effort normal and breath sounds normal. No stridor. No respiratory distress. She has no wheezes. She has no rales.  Abdominal: Soft. Bowel sounds are normal. She exhibits no distension. There is no tenderness. There is no rebound and no guarding.  Musculoskeletal: She exhibits no edema or tenderness.  Neurological: She is alert and oriented to person, place, and time. She has normal strength. No cranial nerve deficit (no facial droop, extraocular movements intact, no  slurred speech) or sensory deficit. She exhibits normal muscle tone. She displays no seizure activity. Coordination normal.  No pronator drift bilateral upper extrem, able to hold both legs off bed for 5 seconds, sensation intact in all extremities, no visual field cuts, no left or right sided neglect, normal finger-nose exam bilaterally, no nystagmus noted   Skin: Skin is warm and dry. No rash noted. She is not diaphoretic.  Psychiatric: She has a normal mood and affect.  Nursing note and vitals reviewed.    ED Treatments / Results  Labs (all labs ordered are listed, but only abnormal results are displayed) Labs Reviewed  BASIC METABOLIC PANEL - Abnormal; Notable for the following components:      Result Value   Sodium 133 (*)    Potassium 3.0 (*)    Chloride 89 (*)    BUN  26 (*)    Creatinine, Ser 1.06 (*)    GFR calc non Af Amer 47 (*)    GFR calc Af Amer 55 (*)    All other components within normal limits  CBC - Abnormal; Notable for the following components:   WBC 11.5 (*)    All other components within normal limits  URINALYSIS, ROUTINE W REFLEX MICROSCOPIC  CBG MONITORING, ED    EKG EKG Interpretation  Date/Time:  Tuesday April 25 2018 11:49:01 EDT Ventricular Rate:  84 PR Interval:    QRS Duration: 75 QT Interval:  336 QTC Calculation: 398 R Axis:   62 Text Interpretation:  Sinus rhythm No significant change since last tracing Confirmed by Linwood Dibbles [phone]) on 04/25/2018 12:01:35 PM   Radiology Ct Head Wo Contrast  Result Date: 04/25/2018 CLINICAL DATA:  Altered level of consciousness EXAM: CT HEAD WITHOUT CONTRAST TECHNIQUE: Contiguous axial images were obtained from the base of the skull through the vertex without intravenous contrast. COMPARISON:  None. FINDINGS: Brain: No evidence of acute infarction, hemorrhage, extra-axial collection, ventriculomegaly, or mass effect. Generalized cerebral atrophy. Periventricular white matter low attenuation likely secondary  to microangiopathy. Vascular: Cerebrovascular atherosclerotic calcifications are noted. Skull: Negative for fracture or focal lesion. Sinuses/Orbits: Visualized portions of the orbits are unremarkable. Visualized portions of the paranasal sinuses and mastoid air cells are unremarkable. Other: None. IMPRESSION: 1. No acute intracranial pathology. 2. Chronic microvascular disease and cerebral atrophy. Electronically Signed   By: Elige Ko   On: 04/25/2018 14:15    Procedures Procedures (including critical care time)  Medications Ordered in ED Medications  0.9 %  sodium chloride infusion ( Intravenous New Bag/Given 04/25/18 1221)  potassium chloride (KLOR-CON) packet 40 mEq (has no administration in time range)     Initial Impression / Assessment and Plan / ED Course  I have reviewed the triage vital signs and the nursing notes.  Pertinent labs & imaging results that were available during my care of the patient were reviewed by me and considered in my medical decision making (see chart for details).  Clinical Course as of Apr 26 1507  Tue Apr 25, 2018  1506 Discussed findings with the patient and her daughter.  Discussed overnight observation the hospital for cardiac monitoring.  Patient states she feels fine and she does not want to be admitted.  She wants to go home   [JK]  1507 Labs notable for mild hypokalemia and hypochloremia.  Slight increase in BUN and creatinine.   [JK]  1508 CT without acute findings   [JK]    Clinical Course User Index [JK] Linwood Dibbles, MD    Patient presented to the emergency room for evaluation of a near syncopal episode.  Daughter also states the patient had some falls recently and wonders if she may have had additional episodes.  Patient is ED work-up is reassuring.  She has some mild electrolyte abnormalities but I doubt these are severe enough to cause her syncope.  It is possible the patient may be having bradycardic episodes or possibly cardiac  dysrhythmias.  I discussed having the patient stay in the hospital for overnight observation.  Patient is adamant that she wants to go home.  She does live with family members who can watch her.  She lives close and can return at any time.  Warning signs and precautions were discussed.  Patient will follow-up with primary care doctor and possibly cardiology for further outpatient evaluation.  Final Clinical Impressions(s) / ED Diagnoses  Final diagnoses:  Near syncope    ED Discharge Orders    None       Linwood Dibbles, MD 04/25/18 [phone]

## 2018-04-25 NOTE — ED Triage Notes (Signed)
Per EMS patient had a syncopal episode at grocery store. Pt's daughter is a Engineer, civil (consulting) and was able to guide patient to the floor and avoid injury. Pt alert and oriented x 3.

## 2018-05-05 ENCOUNTER — Ambulatory Visit: Payer: Medicare HMO | Admitting: Cardiovascular Disease

## 2018-05-05 ENCOUNTER — Encounter: Payer: Self-pay | Admitting: Cardiovascular Disease

## 2018-05-05 VITALS — BP 118/66 | HR 97 | Ht 61.0 in | Wt 101.0 lb

## 2018-05-05 DIAGNOSIS — R296 Repeated falls: Secondary | ICD-10-CM | POA: Diagnosis not present

## 2018-05-05 DIAGNOSIS — R55 Syncope and collapse: Secondary | ICD-10-CM | POA: Diagnosis not present

## 2018-05-05 DIAGNOSIS — I10 Essential (primary) hypertension: Secondary | ICD-10-CM | POA: Diagnosis not present

## 2018-05-05 NOTE — Progress Notes (Signed)
CARDIOLOGY CONSULT NOTE  Patient ID: [name] [name] MRN: [id number] DOB/AGE: 1932-03-26 84 y.o.  Admit date: (Not on file) Primary Physician: Richmond Campbell., PA-C Referring Physician: Richmond Campbell., PA-C  Reason for Consultation: Palpitations and near syncope  HPI: [name] [name] is a 84 y.o. female who is being seen today for the evaluation of palpitations and near syncope at the request of Richmond Campbell., PA-C.   She was evaluated in the ED on 04/25/2018.  I reviewed all relevant documentation, labs, and studies.  Relevant labs: White blood cells elevated at 11.5, sodium 133, potassium 3, BUN 26, creatinine 1.06, normal urinalysis.  Head CT showed no acute intracranial pathology with chronic microvascular disease and cerebral atrophy.  I personally reviewed the ECG which demonstrated sinus rhythm with no acute ischemic ST segment or T wave abnormalities, nor any arrhythmias.  ED documentation mentioned a prior fall where she injured her right hip.  She then went out shopping with the assistance of a walker on 6/11 and while walking became lightheaded and thought she might pass out.  Her daughter described the patient's eyes appeared "glassy "and the patient started to slump forward but never fully lost consciousness.  She did not fall.  She appeared to be dragging her left leg slightly and the entire episode lasted a few minutes.  She had a few episodes of diarrhea in the 2 days preceding this event.  She denied chest pain and shortness of breath.  She is here with her daughter, Arline Asp, who is a Engineer, civil (consulting) on the third floor at [facility].  They were apparently in a grocery store when the event occurred.  Arline Asp believes that her mother passed out completely if only for moments.  She said her mother began staring to the right and her left foot was turned outwards.  She was worried she had a stroke.  Her blood pressure has apparently been fluctuating lately.   When EMT arrived, they were apparently some "skipped beats ".  She uses oxygen at night to help her sleep.  She reportedly is to be very active and cooked and cleaned and was very disciplined but her energy levels have declined significant knee over the past 6 months.   Allergies  Allergen Reactions  . Cortisone Other (See Comments)    Irregular Heart Beat  . Morphine And Related Nausea And Vomiting  . Motrin [Ibuprofen]   . Sulfa Antibiotics Hives  . Vicodin [Hydrocodone-Acetaminophen] Nausea And Vomiting  . Penicillins Hives    Has patient had a PCN reaction causing immediate rash, facial/tongue/throat swelling, SOB or lightheadedness with hypotension: Yes Has patient had a PCN reaction causing severe rash involving mucus membranes or skin necrosis: No Has patient had a PCN reaction that required hospitalization No Has patient had a PCN reaction occurring within the last 10 years: No If all of the above answers are "NO", then may proceed with Cephalosporin use.     Current Outpatient Medications  Medication Sig Dispense Refill  . albuterol (PROVENTIL HFA;VENTOLIN HFA) 108 (90 BASE) MCG/ACT inhaler Inhale 2 puffs into the lungs every 6 (six) hours as needed for wheezing or shortness of breath.    . ALPRAZolam (XANAX) 0.5 MG tablet Take 0.5 mg by mouth 3 (three) times daily.    . budesonide-formoterol (SYMBICORT) 160-4.5 MCG/ACT inhaler Inhale 2 puffs into the lungs 2 (two) times daily.    . cholecalciferol (VITAMIN D) 1000 UNITS tablet Take 1,000 Units  by mouth daily.    . clotrimazole-betamethasone (LOTRISONE) cream Apply to affected area twice daily    . docusate sodium (COLACE) 100 MG capsule Take 100 mg by mouth 2 (two) times daily.    . fluticasone (FLONASE) 50 MCG/ACT nasal spray Place 2 sprays into both nostrils daily.    . hydrochlorothiazide (HYDRODIURIL) 25 MG tablet Take 25 mg by mouth daily.    Marland Kitchen leflunomide (ARAVA) 20 MG tablet Take 20 mg by mouth daily.    . Multiple  Vitamin (MULTIVITAMIN WITH MINERALS) TABS tablet Take 1 tablet by mouth daily.    . ramipril (ALTACE) 10 MG capsule Take 20 mg by mouth daily.    . traMADol (ULTRAM) 50 MG tablet Take 50 mg by mouth 4 (four) times daily.      No current facility-administered medications for this visit.     Past Medical History:  Diagnosis Date  . Anxiety   . Arthritis    rheumatoid  . COPD (chronic obstructive pulmonary disease) (HCC)   . Hypertension     Past Surgical History:  Procedure Laterality Date  . APPENDECTOMY    . CATARACT EXTRACTION W/PHACO Right 11/27/2013   Procedure: CATARACT EXTRACTION PHACO AND INTRAOCULAR LENS PLACEMENT (IOC);  Surgeon: Loraine Leriche T. Nile Riggs, MD;  Location: [facility];  Service: Ophthalmology;  Laterality: Right;  CDE:7.31  . CATARACT EXTRACTION W/PHACO Left 12/11/2013   Procedure: CATARACT EXTRACTION PHACO AND INTRAOCULAR LENS PLACEMENT (IOC);  Surgeon: Loraine Leriche T. Nile Riggs, MD;  Location: [facility];  Service: Ophthalmology;  Laterality: Left;  CDE:7.94  . HUMERUS FRACTURE SURGERY Left    had surgery x3 on it.  . REPLACEMENT TOTAL KNEE BILATERAL      Social History   Socioeconomic History  . Marital status: Married    Spouse name: Not on file  . Number of children: Not on file  . Years of education: Not on file  . Highest education level: Not on file  Occupational History  . Not on file  Social Needs  . Financial resource strain: Not on file  . Food insecurity:    Worry: Not on file    Inability: Not on file  . Transportation needs:    Medical: Not on file    Non-medical: Not on file  Tobacco Use  . Smoking status: Former Smoker    Packs/day: 2.00    Years: 30.00    Pack years: 60.00    Types: Cigarettes  . Smokeless tobacco: Former Neurosurgeon    Quit date: 11/22/1997  Substance and Sexual Activity  . Alcohol use: Yes    Comment: 1 mixed drink at night  . Drug use: No  . Sexual activity: Yes    Birth control/protection: Post-menopausal  Lifestyle  . Physical  activity:    Days per week: Not on file    Minutes per session: Not on file  . Stress: Not on file  Relationships  . Social connections:    Talks on phone: Not on file    Gets together: Not on file    Attends religious service: Not on file    Active member of club or organization: Not on file    Attends meetings of clubs or organizations: Not on file    Relationship status: Not on file  . Intimate partner violence:    Fear of current or ex partner: Not on file    Emotionally abused: Not on file    Physically abused: Not on file    Forced sexual activity:  Not on file  Other Topics Concern  . Not on file  Social History Narrative  . Not on file     No family history of premature CAD in 1st degree relatives.  Current Meds  Medication Sig  . albuterol (PROVENTIL HFA;VENTOLIN HFA) 108 (90 BASE) MCG/ACT inhaler Inhale 2 puffs into the lungs every 6 (six) hours as needed for wheezing or shortness of breath.  . ALPRAZolam (XANAX) 0.5 MG tablet Take 0.5 mg by mouth 3 (three) times daily.  . budesonide-formoterol (SYMBICORT) 160-4.5 MCG/ACT inhaler Inhale 2 puffs into the lungs 2 (two) times daily.  . cholecalciferol (VITAMIN D) 1000 UNITS tablet Take 1,000 Units by mouth daily.  . clotrimazole-betamethasone (LOTRISONE) cream Apply to affected area twice daily  . docusate sodium (COLACE) 100 MG capsule Take 100 mg by mouth 2 (two) times daily.  . fluticasone (FLONASE) 50 MCG/ACT nasal spray Place 2 sprays into both nostrils daily.  . hydrochlorothiazide (HYDRODIURIL) 25 MG tablet Take 25 mg by mouth daily.  Marland Kitchen leflunomide (ARAVA) 20 MG tablet Take 20 mg by mouth daily.  . Multiple Vitamin (MULTIVITAMIN WITH MINERALS) TABS tablet Take 1 tablet by mouth daily.  . ramipril (ALTACE) 10 MG capsule Take 20 mg by mouth daily.  . traMADol (ULTRAM) 50 MG tablet Take 50 mg by mouth 4 (four) times daily.   . [DISCONTINUED] predniSONE (DELTASONE) 20 MG tablet Take 20 mg by mouth as directed. Take 1  tablet by mouth three times a day for three days, then take 1 tablet by mouth twice a day for three days, then take 1 tablet by mouth daily for three days, then stop      Review of systems complete and found to be negative unless listed above in HPI    Physical exam Height 5\' 1"  (1.549 m), weight 101 lb (45.8 kg). General: NAD Neck: No JVD, no thyromegaly or thyroid nodule.  Lungs: Clear to auscultation bilaterally with normal respiratory effort. CV: Nondisplaced PMI. Regular rate and rhythm, normal S1/S2, no S3/S4, no murmur.  No peripheral edema.  No carotid bruit.    Abdomen: Soft, nontender, no distention.  Skin: Intact without lesions or rashes.  Neurologic: Alert and oriented x 3.  Psych: Normal affect. Extremities: No clubbing or cyanosis.  HEENT: Normal.   ECG: Most recent ECG reviewed.   Labs: Lab Results  Component Value Date/Time   K 3.0 (L) 04/25/2018 12:01 PM   BUN 26 (H) 04/25/2018 12:01 PM   CREATININE 1.06 (H) 04/25/2018 12:01 PM   ALT 16 01/11/2017 03:34 PM   HGB 12.9 04/25/2018 12:01 PM     Lipids: No results found for: LDLCALC, LDLDIRECT, CHOL, TRIG, HDL      ASSESSMENT AND PLAN:  1.  Syncope: Unclear etiology at this time.  Her blood pressure is normal and I am concerned about intravascular volume depletion given her recurrent falls.  I will discontinue hydrochlorothiazide.  I will also obtain a 30-day event monitor to evaluate for significant bradycardia and possibly sinus pauses as a potential etiology.  There is no history to suggest ischemic heart disease and there is no family history of heart disease either.  2.  Hypertension: I am stopping hydrochlorothiazide for reasons mentioned above.  Blood pressure will need further monitoring to determine if further antihypertensive titration is indicated.  I would try to avoid diuretics in this elderly patient with frequent falls.   Disposition: Follow up in 8 to 10 weeks  Signed: Prentice Docker,  M.D., F.A.C.C.  05/05/2018, 2:30 PM

## 2018-05-05 NOTE — Patient Instructions (Addendum)
Medication Instructions:  STOP HCTZ  Labwork: NONE  Testing/Procedures: Your physician has recommended that you wear an event monitor. Event monitors are medical devices that record the heart's electrical activity. Doctors most often [place] these monitors to diagnose arrhythmias. Arrhythmias are problems with the speed or rhythm of the heartbeat. The monitor is a small, portable device. You can wear one while you do your normal daily activities. This is usually used to diagnose what is causing palpitations/syncope (passing out).    Follow-Up: Your physician recommends that you schedule a follow-up appointment in: 8-10 WEEKS    Any Other Special Instructions Will Be Listed Below (If Applicable).     If you need a refill on your cardiac medications before your next appointment, please call your pharmacy.

## 2018-05-21 ENCOUNTER — Ambulatory Visit (INDEPENDENT_AMBULATORY_CARE_PROVIDER_SITE_OTHER): Payer: Medicare HMO

## 2018-05-21 DIAGNOSIS — R55 Syncope and collapse: Secondary | ICD-10-CM

## 2018-05-22 ENCOUNTER — Telehealth: Payer: Self-pay | Admitting: Cardiovascular Disease

## 2018-05-22 NOTE — Telephone Encounter (Signed)
Pt is starting to have some swelling since her med changes

## 2018-05-23 NOTE — Telephone Encounter (Signed)
Returned pt call. She states that her legs look much better. She elevated them and cut back on salt. She will call if she has any further problems.

## 2018-06-26 ENCOUNTER — Encounter (HOSPITAL_COMMUNITY): Payer: Self-pay | Admitting: Emergency Medicine

## 2018-06-26 ENCOUNTER — Other Ambulatory Visit: Payer: Self-pay

## 2018-06-26 ENCOUNTER — Observation Stay (HOSPITAL_COMMUNITY)
Admission: EM | Admit: 2018-06-26 | Discharge: 2018-06-28 | Disposition: A | Discharge status: Home / Self Care | Payer: Medicare HMO | Attending: Family Medicine | Admitting: Family Medicine

## 2018-06-26 DIAGNOSIS — W19XXXA Unspecified fall, initial encounter: Secondary | ICD-10-CM | POA: Insufficient documentation

## 2018-06-26 DIAGNOSIS — R42 Dizziness and giddiness: Secondary | ICD-10-CM | POA: Diagnosis not present

## 2018-06-26 DIAGNOSIS — M069 Rheumatoid arthritis, unspecified: Secondary | ICD-10-CM | POA: Insufficient documentation

## 2018-06-26 DIAGNOSIS — Z79891 Long term (current) use of opiate analgesic: Secondary | ICD-10-CM | POA: Insufficient documentation

## 2018-06-26 DIAGNOSIS — Z87891 Personal history of nicotine dependence: Secondary | ICD-10-CM | POA: Diagnosis not present

## 2018-06-26 DIAGNOSIS — J449 Chronic obstructive pulmonary disease, unspecified: Secondary | ICD-10-CM | POA: Diagnosis not present

## 2018-06-26 DIAGNOSIS — Y92009 Unspecified place in unspecified non-institutional (private) residence as the place of occurrence of the external cause: Secondary | ICD-10-CM | POA: Diagnosis not present

## 2018-06-26 DIAGNOSIS — Z7951 Long term (current) use of inhaled steroids: Secondary | ICD-10-CM | POA: Insufficient documentation

## 2018-06-26 DIAGNOSIS — R51 Headache: Secondary | ICD-10-CM

## 2018-06-26 DIAGNOSIS — F419 Anxiety disorder, unspecified: Secondary | ICD-10-CM | POA: Diagnosis present

## 2018-06-26 DIAGNOSIS — I10 Essential (primary) hypertension: Secondary | ICD-10-CM | POA: Diagnosis not present

## 2018-06-26 DIAGNOSIS — Z96653 Presence of artificial knee joint, bilateral: Secondary | ICD-10-CM | POA: Diagnosis not present

## 2018-06-26 DIAGNOSIS — R519 Headache, unspecified: Secondary | ICD-10-CM

## 2018-06-26 DIAGNOSIS — Z79899 Other long term (current) drug therapy: Secondary | ICD-10-CM | POA: Diagnosis not present

## 2018-06-26 DIAGNOSIS — I160 Hypertensive urgency: Secondary | ICD-10-CM | POA: Diagnosis present

## 2018-06-26 DIAGNOSIS — S20219A Contusion of unspecified front wall of thorax, initial encounter: Secondary | ICD-10-CM

## 2018-06-26 DIAGNOSIS — R0789 Other chest pain: Secondary | ICD-10-CM | POA: Diagnosis not present

## 2018-06-26 NOTE — ED Triage Notes (Signed)
Pt fell at home today and hit L ribcage on corner of dresser. Pt's husband states they were unable to obtain a BP at home. Pt c/o headache and L. Rib pain.

## 2018-06-27 ENCOUNTER — Emergency Department (HOSPITAL_COMMUNITY): Payer: Medicare HMO

## 2018-06-27 ENCOUNTER — Observation Stay (HOSPITAL_BASED_OUTPATIENT_CLINIC_OR_DEPARTMENT_OTHER): Payer: Medicare HMO

## 2018-06-27 ENCOUNTER — Encounter (HOSPITAL_COMMUNITY): Payer: Self-pay | Admitting: *Deleted

## 2018-06-27 ENCOUNTER — Other Ambulatory Visit: Payer: Self-pay

## 2018-06-27 DIAGNOSIS — I5030 Unspecified diastolic (congestive) heart failure: Secondary | ICD-10-CM

## 2018-06-27 DIAGNOSIS — R51 Headache: Secondary | ICD-10-CM

## 2018-06-27 DIAGNOSIS — R519 Headache, unspecified: Secondary | ICD-10-CM | POA: Diagnosis present

## 2018-06-27 DIAGNOSIS — F419 Anxiety disorder, unspecified: Secondary | ICD-10-CM | POA: Diagnosis present

## 2018-06-27 DIAGNOSIS — I160 Hypertensive urgency: Secondary | ICD-10-CM | POA: Diagnosis not present

## 2018-06-27 DIAGNOSIS — J449 Chronic obstructive pulmonary disease, unspecified: Secondary | ICD-10-CM | POA: Diagnosis present

## 2018-06-27 DIAGNOSIS — I10 Essential (primary) hypertension: Secondary | ICD-10-CM | POA: Diagnosis present

## 2018-06-27 LAB — BASIC METABOLIC PANEL
Anion gap: 12 (ref 5–15)
BUN: 8 mg/dL (ref 8–23)
CHLORIDE: 96 mmol/L — AB (ref 98–111)
CO2: 27 mmol/L (ref 22–32)
Calcium: 9.6 mg/dL (ref 8.9–10.3)
Creatinine, Ser: 0.72 mg/dL (ref 0.44–1.00)
GFR calc Af Amer: >60 mL/min (ref >60)
GFR calc non Af Amer: >60 mL/min (ref >60)
GLUCOSE: 134 mg/dL — AB (ref 70–99)
POTASSIUM: 3.5 mmol/L (ref 3.5–5.1)
Sodium: 135 mmol/L (ref 135–145)

## 2018-06-27 LAB — ECHOCARDIOGRAM COMPLETE
HEIGHTINCHES: 61 in
WEIGHTICAEL: 1615.53 [oz_av]

## 2018-06-27 LAB — CBC WITH DIFFERENTIAL/PLATELET
Basophils Absolute: 0 10*3/uL (ref 0.0–0.1)
Basophils Relative: 0 %
Eosinophils Absolute: 0.1 10*3/uL (ref 0.0–0.7)
Eosinophils Relative: 1 %
HCT: 43.6 % (ref 36.0–46.0)
HEMOGLOBIN: 14.2 g/dL (ref 12.0–15.0)
LYMPHS ABS: 1.3 10*3/uL (ref 0.7–4.0)
LYMPHS PCT: 22 %
MCH: 29.3 pg (ref 26.0–34.0)
MCHC: 32.6 g/dL (ref 30.0–36.0)
MCV: 89.9 fL (ref 78.0–100.0)
Monocytes Absolute: 0.6 10*3/uL (ref 0.1–1.0)
Monocytes Relative: 10 %
NEUTROS PCT: 67 %
Neutro Abs: 3.8 10*3/uL (ref 1.7–7.7)
Platelets: 222 10*3/uL (ref 150–400)
RBC: 4.85 MIL/uL (ref 3.87–5.11)
RDW: 15 % (ref 11.5–15.5)
WBC: 5.7 10*3/uL (ref 4.0–10.5)

## 2018-06-27 LAB — MAGNESIUM: MAGNESIUM: 1.8 mg/dL (ref 1.7–2.4)

## 2018-06-27 LAB — URINALYSIS, ROUTINE W REFLEX MICROSCOPIC
Bilirubin Urine: NEGATIVE
GLUCOSE, UA: NEGATIVE mg/dL
HGB URINE DIPSTICK: NEGATIVE
Ketones, ur: 5 mg/dL — AB
Leukocytes, UA: NEGATIVE
Nitrite: NEGATIVE
Protein, ur: NEGATIVE mg/dL
SPECIFIC GRAVITY, URINE: 1.006 (ref 1.005–1.030)
pH: 9 — ABNORMAL HIGH (ref 5.0–8.0)

## 2018-06-27 LAB — PHOSPHORUS: PHOSPHORUS: 3 mg/dL (ref 2.5–4.6)

## 2018-06-27 MED ORDER — HYDRALAZINE HCL 20 MG/ML IJ SOLN: 10.0000 mg | INTRAMUSCULAR | Status: DC | PRN

## 2018-06-27 MED ORDER — ENOXAPARIN SODIUM 30 MG/0.3ML ~~LOC~~ SOLN: 30.0000 mg | SUBCUTANEOUS | Status: DC

## 2018-06-27 MED ORDER — ONDANSETRON HCL 4 MG/2ML IJ SOLN: 4.0000 mg | Freq: Four times a day (QID) | INTRAMUSCULAR | Status: DC | PRN

## 2018-06-27 MED ORDER — RAMIPRIL 5 MG PO CAPS
10.0000 mg | ORAL_CAPSULE | Freq: Two times a day (BID) | ORAL | Status: DC
  Administered 2018-06-27 – 2018-06-28 (×3): 10 mg via ORAL
  Filled 2018-06-27 (×3): qty 2

## 2018-06-27 MED ORDER — ENSURE ENLIVE PO LIQD
237.0000 mL | Freq: Two times a day (BID) | ORAL | Status: DC
  Administered 2018-06-27 – 2018-06-28 (×3): 237 mL via ORAL

## 2018-06-27 MED ORDER — HYDRALAZINE HCL 20 MG/ML IJ SOLN
10.0000 mg | Freq: Once | INTRAMUSCULAR | Status: AC
Start: 2018-06-27 — End: 2018-06-27
  Administered 2018-06-27: 10 mg via INTRAVENOUS
  Filled 2018-06-27: qty 1

## 2018-06-27 MED ORDER — ACETAMINOPHEN 650 MG RE SUPP
650.0000 mg | Freq: Four times a day (QID) | RECTAL | Status: DC | PRN
Start: 2018-06-27 — End: 2018-06-28

## 2018-06-27 MED ORDER — LORAZEPAM 2 MG/ML IJ SOLN
0.5000 mg | Freq: Once | INTRAMUSCULAR | Status: AC
  Administered 2018-06-27: 0.5 mg via INTRAVENOUS
  Filled 2018-06-27: qty 1

## 2018-06-27 MED ORDER — CLONIDINE HCL 0.1 MG PO TABS
0.1000 mg | ORAL_TABLET | Freq: Once | ORAL | Status: AC
  Administered 2018-06-27: 0.1 mg via ORAL
  Filled 2018-06-27: qty 1

## 2018-06-27 MED ORDER — ONDANSETRON HCL 4 MG PO TABS
4.0000 mg | ORAL_TABLET | Freq: Four times a day (QID) | ORAL | Status: DC | PRN
  Administered 2018-06-27: 4 mg via ORAL
  Filled 2018-06-27: qty 1

## 2018-06-27 MED ORDER — SODIUM CHLORIDE 0.9 % IV SOLN
Freq: Once | INTRAVENOUS | Status: AC
  Administered 2018-06-27: 03:00:00 via INTRAVENOUS

## 2018-06-27 MED ORDER — ENOXAPARIN SODIUM 40 MG/0.4ML ~~LOC~~ SOLN
40.0000 mg | SUBCUTANEOUS | Status: DC
  Administered 2018-06-27: 40 mg via SUBCUTANEOUS
  Filled 2018-06-27: qty 0.4

## 2018-06-27 MED ORDER — METOPROLOL TARTRATE 25 MG PO TABS
12.5000 mg | ORAL_TABLET | Freq: Two times a day (BID) | ORAL | Status: DC
  Administered 2018-06-27 (×2): 12.5 mg via ORAL
  Filled 2018-06-27 (×2): qty 1

## 2018-06-27 MED ORDER — ACETAMINOPHEN 325 MG PO TABS: 650.0000 mg | ORAL_TABLET | Freq: Four times a day (QID) | ORAL | Status: DC | PRN

## 2018-06-27 MED ORDER — POTASSIUM CHLORIDE CRYS ER 20 MEQ PO TBCR
20.0000 meq | EXTENDED_RELEASE_TABLET | Freq: Once | ORAL | Status: AC
  Administered 2018-06-27: 20 meq via ORAL
  Filled 2018-06-27: qty 1

## 2018-06-27 MED ORDER — ONDANSETRON HCL 4 MG/2ML IJ SOLN
4.0000 mg | Freq: Once | INTRAMUSCULAR | Status: AC
  Administered 2018-06-27: 4 mg via INTRAVENOUS
  Filled 2018-06-27: qty 2

## 2018-06-27 NOTE — Progress Notes (Signed)
Nutrition Brief Note  Patient is an 84 yo female who presents with following a fall. Hx of RA, COPD and hypertension. Patient identified by RD through the Malnutrition Screening Tool (MST) Report.   Weight Hx: Wt Readings from Last 15 Encounters:  06/27/18 45.8 kg  05/05/18 45.8 kg  04/19/17 48.5 kg  01/27/17 49.9 kg  01/11/17 49.4 kg   Patient has been weighing around 101 lb for more than 6 months.  Body mass index is 19.08 kg/m. Patient meets criteria for normal range based on current BMI.   Current diet order is Heart Healthy- patient is able to feed herself and order alternate meal options. No acute changes in appetite. Encouraged pt to verbalize desired food choices to maximize intake and meal satisfaction.  Labs and medications reviewed.  BMP Latest Ref Rng & Units 06/27/2018 04/25/2018 01/11/2017  Glucose 70 - 99 mg/dL 427(C) 92 623(J)  BUN 8 - 23 mg/dL 8 62(G) 31(D)  Creatinine 0.44 - 1.00 mg/dL 1.76 1.60(V) 3.71  Sodium 135 - 145 mmol/L 135 133(L) 130(L)  Potassium 3.5 - 5.1 mmol/L 3.5 3.0(L) 3.6  Chloride 98 - 111 mmol/L 96(L) 89(L) 91(L)  CO2 22 - 32 mmol/L 27 31 29   Calcium 8.9 - 10.3 mg/dL 9.6 9.5 9.7    Medications reviewed and include: Ensure Enlive, Lopressor, Lovenox, Altace.   No additional nutrition interventions warranted at this time. If nutrition issues arise, please consult RD.   MS,RD,CSG,LDN Office: [phone] Pager: [phone]

## 2018-06-27 NOTE — Progress Notes (Addendum)
Patient alert and oriented x4. No complaints of pain, shortness of breath, chest pain, dizziness, nausea or vomiting. Patient appetite fair, tolerating diet well so far. Patient tolerated PO medications well. Dr. Ardyth Harps aware of previous high blood pressures, and metoprolol and altace given per orders with good effect, blood pressure has improved.  Nurse to Nurse report called and given to Amy RN on 300. Patient transferred to room 333 via wheelchair. Family aware.

## 2018-06-27 NOTE — Progress Notes (Signed)
Patient seen and examined, database reviewed.  Discussed with daughter at bedside.  Patient here with chest pain and hypertensive urgency.  CT head negative.  CTA had been ordered but do not believe this is necessary at this time as I believe headache is proportional to the hypertension.  Currently blood pressure remains in the 180/90 range.  Will add low-dose metoprolol to her home dose of ramipril 10 mg twice daily.  We will transfer to the floor and observe another 24 hours.  If blood pressure improved and headache improved plan for discharge home over next 24 hours.  Peggye Pitt, MD [facility] Pager: [phone]

## 2018-06-27 NOTE — ED Provider Notes (Signed)
[facility] EMERGENCY DEPARTMENT Provider Note   CSN: [id number] Arrival date & time: 06/26/18  2243     History   Chief Complaint Chief Complaint  Patient presents with  . Fall    HPI [name] [name] is a 84 y.o. female.  HPI   [name] [name] is a 84 y.o. female who presents to the Emergency Department complaining of left rib pain and elevated blood pressure.  Symptoms began on the afternoon of arrival.  Patient states that she fell up against the corner of a dresser striking her left ribs.  She did not fall or hit her head.  She complains of pain to her ribs associated with movement and deep breathing.  She also notes that her blood pressure has been elevated this afternoon which is unusual for her.  She took her Altase this morning as usual.  She also complains of a frontal headache and nasal congestion.  Headache gradual in onset.  She denies fall, head injury, dizziness, visual changes, neck pain, nausea or vomiting, shortness of breath, and abdominal pain.   Past Medical History:  Diagnosis Date  . Anxiety   . Arthritis    rheumatoid  . COPD (chronic obstructive pulmonary disease) (HCC)   . Hypertension     There are no active problems to display for this patient.   Past Surgical History:  Procedure Laterality Date  . APPENDECTOMY    . CATARACT EXTRACTION W/PHACO Right 11/27/2013   Procedure: CATARACT EXTRACTION PHACO AND INTRAOCULAR LENS PLACEMENT (IOC);  Surgeon: Loraine Leriche T. Nile Riggs, MD;  Location: [facility];  Service: Ophthalmology;  Laterality: Right;  CDE:7.31  . CATARACT EXTRACTION W/PHACO Left 12/11/2013   Procedure: CATARACT EXTRACTION PHACO AND INTRAOCULAR LENS PLACEMENT (IOC);  Surgeon: Loraine Leriche T. Nile Riggs, MD;  Location: [facility];  Service: Ophthalmology;  Laterality: Left;  CDE:7.94  . HUMERUS FRACTURE SURGERY Left    had surgery x3 on it.  . REPLACEMENT TOTAL KNEE BILATERAL       OB History   None      Home Medications    Prior to Admission medications     Medication Sig Start Date End Date Taking? Authorizing Provider  albuterol (PROVENTIL HFA;VENTOLIN HFA) 108 (90 BASE) MCG/ACT inhaler Inhale 2 puffs into the lungs every 6 (six) hours as needed for wheezing or shortness of breath.   Yes [provider]  ALPRAZolam Prudy Feeler) 0.5 MG tablet Take 0.5 mg by mouth 3 (three) times daily.   Yes [provider]  budesonide-formoterol (SYMBICORT) 160-4.5 MCG/ACT inhaler Inhale 2 puffs into the lungs 2 (two) times daily. 01/30/18  Yes [provider]  cholecalciferol (VITAMIN D) 1000 UNITS tablet Take 1,000 Units by mouth daily.   Yes [provider]  clotrimazole-betamethasone (LOTRISONE) cream Apply to affected area twice daily 12/27/16  Yes [provider]  fluticasone (FLONASE) 50 MCG/ACT nasal spray Place 2 sprays into both nostrils daily. 10/12/17  Yes [provider]  hydrochlorothiazide (HYDRODIURIL) 25 MG tablet Take 25 mg by mouth daily.   Yes [provider]  leflunomide (ARAVA) 20 MG tablet Take 20 mg by mouth daily.   Yes [provider]  Multiple Vitamin (MULTIVITAMIN WITH MINERALS) TABS tablet Take 1 tablet by mouth daily.   Yes [provider]  ramipril (ALTACE) 10 MG capsule Take 20 mg by mouth daily.   Yes [provider]  traMADol (ULTRAM) 50 MG tablet Take 50 mg by mouth 4 (four) times daily.    Yes [provider]  docusate sodium (COLACE) 100 MG capsule Take 100 mg by mouth 2 (two) times daily.    [provider]    Family History No family history on file.  Social History Social History   Tobacco Use  . Smoking status: Former Smoker    Packs/day: 2.00    Years: 30.00    Pack years: 60.00    Types: Cigarettes  . Smokeless tobacco: Former Neurosurgeon    Quit date: 11/22/1997  Substance Use Topics  . Alcohol use: Yes    Comment: 1 mixed drink at night  . Drug use: No     Allergies   Cortisone; Morphine and related; Motrin  [ibuprofen]; Sulfa antibiotics; Vicodin [hydrocodone-acetaminophen]; and Penicillins   Review of Systems Review of Systems  Constitutional: Negative for chills and fever.  HENT: Positive for congestion and rhinorrhea. Negative for sore throat and trouble swallowing.   Eyes: Negative for visual disturbance.  Respiratory: Negative for chest tightness and shortness of breath.   Cardiovascular: Positive for chest pain (Left rib pain).  Gastrointestinal: Negative for abdominal pain, nausea and vomiting.  Genitourinary: Negative for dysuria and flank pain.  Musculoskeletal: Negative for arthralgias, joint swelling, neck pain and neck stiffness.  Skin: Negative for color change and wound.  Neurological: Positive for headaches. Negative for dizziness, syncope, facial asymmetry, weakness and numbness.  Psychiatric/Behavioral: Negative for confusion.  All other systems reviewed and are negative.    Physical Exam Updated Vital Signs BP (!) 224/101 (BP Location: Right Arm)   Pulse 89   Temp 98.1 F (36.7 C) (Temporal)   Resp 18   Ht 5\' 1"  (1.549 m)   Wt 45.4 kg   SpO2 99%   BMI 18.89 kg/m   Physical Exam  Constitutional: She is oriented to person, place, and time. She appears well-developed and well-nourished. No distress.  HENT:  Head: Atraumatic.  Right Ear: Tympanic membrane and ear canal normal.  Left Ear: Tympanic membrane and ear canal normal.  Nose: Mucosal edema and rhinorrhea present.  Mouth/Throat: Uvula is midline, oropharynx is clear and moist and mucous membranes are normal.  Eyes: Pupils are equal, round, and reactive to light. Conjunctivae and EOM are normal.  Neck: Normal range of motion. Neck supple.  Cardiovascular: Normal rate and regular rhythm.  Pulmonary/Chest: Effort normal and breath sounds normal. She exhibits tenderness (Focal tenderness to the anteriolateral left chest wall.  No crepitus or bony deformity.).  Abdominal: Soft. She exhibits no mass. There is  no tenderness. There is no guarding.  Musculoskeletal: Normal range of motion.  Neurological: She is alert and oriented to person, place, and time. She has normal strength. No sensory deficit. Coordination normal. GCS eye subscore is 4. GCS verbal subscore is 5. GCS motor subscore is 6.  CN II-XII intact.  Speech clear.  No pronator drift.  No facial weakness  Skin: Skin is warm. Capillary refill takes less than 2 seconds.  Psychiatric: She has a normal mood and affect.  Nursing note and vitals reviewed.    ED Treatments / Results  Labs (all labs ordered are listed, but only abnormal results are displayed) Labs Reviewed - No data to display  EKG None  Radiology Dg Ribs Unilateral W/chest Left  Result Date: 06/27/2018 CLINICAL DATA:  Patient fell today striking the left anterior lower ribs. Pain to that area. EXAM: LEFT RIBS AND CHEST - 3+ VIEW COMPARISON:  Chest 04/06/2016 FINDINGS: Normal heart size and pulmonary vascularity. Peribronchial thickening suggesting chronic bronchitis. No  focal airspace disease or consolidation in the lungs. No blunting of costophrenic angles. No pneumothorax. Mediastinal contours appear intact. Calcified and tortuous aorta. Postoperative changes in the left humerus. Left ribs appear intact. No acute displaced fractures identified. No focal bone lesion or bone destruction. Soft tissues are unremarkable. Degenerative changes in the spine. IMPRESSION: Chronic bronchitic changes in the lungs. No evidence of active pulmonary disease. Negative left ribs. Electronically Signed   By: Burman Nieves M.D.   On: 06/27/2018 01:23    Procedures Procedures (including critical care time)  Medications Ordered in ED Medications - No data to display   Initial Impression / Assessment and Plan / ED Course  I have reviewed the triage vital signs and the nursing notes.  Pertinent labs & imaging results that were available during my care of the patient were reviewed by me  and considered in my medical decision making (see chart for details).     Vitals rechecked, remains hypertensive.  No focal neuro deficit on exam. Pt reports episode of vomiting after returning from xray. Will address BP and order labs, CT head.      0205  Discussed pt findings with Dr. Judd Lien who assumes care of patient.    Final Clinical Impressions(s) / ED Diagnoses   Final diagnoses:  None    ED Discharge Orders    None       Pauline Aus, PA-C 06/27/18 0220    Geoffery Lyons, MD 06/27/18 [phone]

## 2018-06-27 NOTE — H&P (Signed)
History and Physical    [name] [name] SHF:[id number] DOB: 1932-06-07 DOA: 06/26/2018  PCP: Richmond Campbell., PA-C   Patient coming from: Home.  I have personally briefly reviewed patient's old medical records in [facility]  Chief Complaint: Fall.  HPI: [name] [name] is a 84 y.o. female with medical history significant of anxiety, rheumatoid arthritis, COPD, hypertension who is coming to the emergency department after having a fall at home and injuring her chest wall.  She also started having a frontal headache about 2000 which has persisted.  She denies double or blurred vision.  Denies focal numbness or weakness.  However, she has felt nauseous and lightheaded.  She denies fever, chills, sore throat, dyspnea, wheezing, hemoptysis, chest pain, palpitations, dizziness, diaphoresis, PND, orthopnea, but occasionally gets mild lower extremities edema.  No abdominal pain, diarrhea, constipation, melena or hematochezia.  Denies dysuria, frequency or hematuria.  No polyuria, polydipsia or blurred vision.  No heat or cold intolerance.  ED Course: Initial vital signs temperature 98.1 F, pulse 89, respirations 18, blood pressure 224/101 mmHg and O2 sat 99% on room air.  The patient was given clonidine 0.1 mg x 2 in the emergency department without significant response.  However, she seems to have thrown up the first tablet of clonidine when she vomited in the emergency department.  I ordered hydralazine 10 mg plus lorazepam 0.5 mg in the ED.  Her urine analysis was colorless, with increased pH of 9.0 and ketones of 5 mg/dL.  Sodium 135, potassium 3.5, chloride 96 and CO2 27 mmol/L.  BUN 8, creatinine 0.72 and glucose 134 mg/dL. EKG NSR.  Rib series and CT head without contrast did not show any acute abnormalities.  Review of Systems: As per HPI otherwise 10 point review of systems negative.   Past Medical History:  Diagnosis Date  . Anxiety   . Arthritis    rheumatoid  . COPD (chronic  obstructive pulmonary disease) (HCC)   . Hypertension     Past Surgical History:  Procedure Laterality Date  . APPENDECTOMY    . CATARACT EXTRACTION W/PHACO Right 11/27/2013   Procedure: CATARACT EXTRACTION PHACO AND INTRAOCULAR LENS PLACEMENT (IOC);  Surgeon: Loraine Leriche T. Nile Riggs, MD;  Location: [facility];  Service: Ophthalmology;  Laterality: Right;  CDE:7.31  . CATARACT EXTRACTION W/PHACO Left 12/11/2013   Procedure: CATARACT EXTRACTION PHACO AND INTRAOCULAR LENS PLACEMENT (IOC);  Surgeon: Loraine Leriche T. Nile Riggs, MD;  Location: [facility];  Service: Ophthalmology;  Laterality: Left;  CDE:7.94  . HUMERUS FRACTURE SURGERY Left    had surgery x3 on it.  . REPLACEMENT TOTAL KNEE BILATERAL       reports that she has quit smoking. Her smoking use included cigarettes. She has a 60.00 pack-year smoking history. She quit smokeless tobacco use about 20 years ago. She reports that she drinks alcohol. She reports that she does not use drugs.  Allergies  Allergen Reactions  . Cortisone Other (See Comments)    Irregular Heart Beat  . Morphine And Related Nausea And Vomiting  . Motrin [Ibuprofen]   . Sulfa Antibiotics Hives  . Vicodin [Hydrocodone-Acetaminophen] Nausea And Vomiting  . Penicillins Hives    Has patient had a PCN reaction causing immediate rash, facial/tongue/throat swelling, SOB or lightheadedness with hypotension: Yes Has patient had a PCN reaction causing severe rash involving mucus membranes or skin necrosis: No Has patient had a PCN reaction that required hospitalization No Has patient had a PCN reaction occurring within the last 10 years: No  If all of the above answers are "NO", then may proceed with Cephalosporin use.    Family History  Problem Relation Age of Onset  . Hypertension Mother   .  Hypertension Father     Prior to Admission medications   Medication Sig Start Date End Date Taking? Authorizing Provider  albuterol (PROVENTIL HFA;VENTOLIN HFA) 108 (90 BASE) MCG/ACT inhaler  Inhale 2 puffs into the lungs every 6 (six) hours as needed for wheezing or shortness of breath.   Yes [provider]  ALPRAZolam Prudy Feeler) 0.5 MG tablet Take 0.5 mg by mouth 3 (three) times daily.   Yes [provider]  budesonide-formoterol (SYMBICORT) 160-4.5 MCG/ACT inhaler Inhale 2 puffs into the lungs 2 (two) times daily. 01/30/18  Yes [provider]  cholecalciferol (VITAMIN D) 1000 UNITS tablet Take 1,000 Units by mouth daily.   Yes [provider]  clotrimazole-betamethasone (LOTRISONE) cream Apply to affected area twice daily 12/27/16  Yes [provider]  fluticasone (FLONASE) 50 MCG/ACT nasal spray Place 2 sprays into both nostrils daily. 10/12/17  Yes [provider]  hydrochlorothiazide (HYDRODIURIL) 25 MG tablet Take 25 mg by mouth daily.   Yes [provider]  leflunomide (ARAVA) 20 MG tablet Take 20 mg by mouth daily.   Yes [provider]  Multiple Vitamin (MULTIVITAMIN WITH MINERALS) TABS tablet Take 1 tablet by mouth daily.   Yes [provider]  ramipril (ALTACE) 10 MG capsule Take 20 mg by mouth daily.   Yes [provider]  traMADol (ULTRAM) 50 MG tablet Take 50 mg by mouth 4 (four) times daily.    Yes [provider]  docusate sodium (COLACE) 100 MG capsule Take 100 mg by mouth 2 (two) times daily.    [provider]    Physical Exam: Vitals:   06/27/18 0458 06/27/18 0500 06/27/18 0544 06/27/18 0549  BP: (!) 203/99 (!) 203/99    Pulse:  94    Resp:  19    Temp:   98.7 F (37.1 C)   TempSrc:   Oral   SpO2:  94%    Weight:    45.8 kg  Height:        Constitutional: NAD, calm, comfortable Eyes: PERRL, mild blepharitis. Conjunctivae normal ENMT: Positive rhinorrhea.  Mucous membranes are moist. Posterior pharynx clear of any exudate or lesions. Neck: normal, supple, no masses, no thyromegaly Respiratory: Decreased breath sounds on bases, otherwise clear to  auscultation bilaterally, no wheezing, no crackles. Normal respiratory effort. No accessory muscle use.   Chest:Positive tenderness to palpation of anterior lateral thoracic cage.  cardiovascular: Regular rate and rhythm, no murmurs / rubs / gallops. No extremity edema. 2+ pedal pulses. No carotid bruits.  Abdomen: Soft, no tenderness, no masses palpated. No hepatosplenomegaly. Bowel sounds positive.  Musculoskeletal: no clubbing / cyanosis. No joint deformity upper and lower extremities. Good ROM, no contractures. Normal muscle tone.  Skin: no rashes, lesions, ulcers on very limited dermatological examination. Neurologic: CN 2-12 grossly intact. Sensation intact, DTR normal. Strength 5/5 in all 4.  Psychiatric: Normal judgment and insight. Alert and oriented x 4. Normal mood.    Labs on Admission: I have personally reviewed following labs and imaging studies  CBC: Recent Labs  Lab 06/27/18 0248  WBC 5.7  NEUTROABS 3.8  HGB 14.2  HCT 43.6  MCV 89.9  PLT 222   Basic Metabolic Panel: Recent Labs  Lab 06/27/18 0248  NA 135  K 3.5  CL 96*  CO2  27  GLUCOSE 134*  BUN 8  CREATININE 0.72  CALCIUM 9.6   GFR: Estimated Creatinine Clearance: 37.8 mL/min (by C-G formula based on SCr of 0.72 mg/dL). Liver Function Tests: No results for input(s): AST, ALT, ALKPHOS, BILITOT, PROT, ALBUMIN in the last 168 hours. No results for input(s): LIPASE, AMYLASE in the last 168 hours. No results for input(s): AMMONIA in the last 168 hours. Coagulation Profile: No results for input(s): INR, PROTIME in the last 168 hours. Cardiac Enzymes: No results for input(s): CKTOTAL, CKMB, CKMBINDEX, TROPONINI in the last 168 hours. BNP (last 3 results) No results for input(s): PROBNP in the last 8760 hours. HbA1C: No results for input(s): HGBA1C in the last 72 hours. CBG: No results for input(s): GLUCAP in the last 168 hours. Lipid Profile: No results for input(s): CHOL, HDL, LDLCALC, TRIG, CHOLHDL,  LDLDIRECT in the last 72 hours. Thyroid Function Tests: No results for input(s): TSH, T4TOTAL, FREET4, T3FREE, THYROIDAB in the last 72 hours. Anemia Panel: No results for input(s): VITAMINB12, FOLATE, FERRITIN, TIBC, IRON, RETICCTPCT in the last 72 hours. Urine analysis:    Component Value Date/Time   COLORURINE COLORLESS (A) 06/27/2018 0415   APPEARANCEUR CLEAR 06/27/2018 0415   LABSPEC 1.006 06/27/2018 0415   PHURINE 9.0 (H) 06/27/2018 0415   GLUCOSEU NEGATIVE 06/27/2018 0415   HGBUR NEGATIVE 06/27/2018 0415   BILIRUBINUR NEGATIVE 06/27/2018 0415   KETONESUR 5 (A) 06/27/2018 0415   PROTEINUR NEGATIVE 06/27/2018 0415   NITRITE NEGATIVE 06/27/2018 0415   LEUKOCYTESUR NEGATIVE 06/27/2018 0415    Radiological Exams on Admission: Dg Ribs Unilateral W/chest Left  Result Date: 06/27/2018 CLINICAL DATA:  Patient fell today striking the left anterior lower ribs. Pain to that area. EXAM: LEFT RIBS AND CHEST - 3+ VIEW COMPARISON:  Chest 04/06/2016 FINDINGS: Normal heart size and pulmonary vascularity. Peribronchial thickening suggesting chronic bronchitis. No focal airspace disease or consolidation in the lungs. No blunting of costophrenic angles. No pneumothorax. Mediastinal contours appear intact. Calcified and tortuous aorta. Postoperative changes in the left humerus. Left ribs appear intact. No acute displaced fractures identified. No focal bone lesion or bone destruction. Soft tissues are unremarkable. Degenerative changes in the spine. IMPRESSION: Chronic bronchitic changes in the lungs. No evidence of active pulmonary disease. Negative left ribs. Electronically Signed   By: Burman Nieves M.D.   On: 06/27/2018 01:23   Ct Head Wo Contrast  Result Date: 06/27/2018 CLINICAL DATA:  Altered mental status EXAM: CT HEAD WITHOUT CONTRAST TECHNIQUE: Contiguous axial images were obtained from the base of the skull through the vertex without intravenous contrast. COMPARISON:  04/25/2018 FINDINGS:  Brain: Chronic atrophic changes are noted with chronic white matter ischemic changes stable from the prior exam. No findings to suggest acute hemorrhage, acute infarction or space-occupying mass lesion are noted. Vascular: No hyperdense vessel or unexpected calcification. Skull: Normal. Negative for fracture or focal lesion. Sinuses/Orbits: No acute finding. Other: None. IMPRESSION: Chronic atrophic and ischemic changes without acute abnormality. Electronically Signed   By: Alcide Clever M.D.   On: 06/27/2018 02:42    EKG: Independently reviewed.  Ventricular Rate:         88 PR Interval:                   QRS Duration: 67 QT Interval:                 346 QTC Calculation:        419 R Axis:  58     Sinus rhythm  Normal ECG   Assessment/Plan Principal Problem:   Hypertensive urgency Observation/telemetry. Continue hydralazine as needed. Check CTA head and neck. Resume oral antihypertensive medications if normal. Consider further work-up if blood pressure remains elevated. Analgesics for anterior chest wall pain.  Active Problems:   COPD (chronic obstructive pulmonary disease) (HCC) Bronchodilators and supplemental oxygen as needed. Incentive spirometry to avoid atelectasis after chest wall trauma.    Anxiety Given lorazepam 0.5 mg IVP x1. This is helping with the patient's blood pressure.    Headache Analgesics as needed. CTA head and neck ordered.      DVT prophylaxis: Lovenox SQ. Code Status: Full code. Family Communication: Her husband, son and daughter were present in the room. Disposition Plan: Observation for blood pressure control.  CTA head and neck. Consults called:  Admission status: Observation/stepdown.   Bobette Mo MD [facility] Pager [phone].  If 7PM-7AM, please contact night-coverage [email or web address] Password [facility]  06/27/2018, 6:47 AM

## 2018-06-27 NOTE — Progress Notes (Signed)
*  PRELIMINARY RESULTS* Echocardiogram 2D Echocardiogram has been performed.  [name] [name] 06/27/2018, 3:34 PM

## 2018-06-27 NOTE — Care Management Obs Status (Signed)
MEDICARE OBSERVATION STATUS NOTIFICATION   Patient Details  Name: [name] [name] MRN: [id number] Date of Birth: 1932/07/30   Medicare Observation Status Notification Given:  Yes    Wilba Mutz, Chrystine Oiler, RN 06/27/2018, 11:56 AM

## 2018-06-28 DIAGNOSIS — F419 Anxiety disorder, unspecified: Secondary | ICD-10-CM

## 2018-06-28 DIAGNOSIS — J449 Chronic obstructive pulmonary disease, unspecified: Secondary | ICD-10-CM | POA: Diagnosis not present

## 2018-06-28 DIAGNOSIS — R51 Headache: Secondary | ICD-10-CM | POA: Diagnosis not present

## 2018-06-28 DIAGNOSIS — I160 Hypertensive urgency: Secondary | ICD-10-CM

## 2018-06-28 LAB — BASIC METABOLIC PANEL
ANION GAP: 10 (ref 5–15)
BUN: 15 mg/dL (ref 8–23)
CALCIUM: 9.3 mg/dL (ref 8.9–10.3)
CO2: 29 mmol/L (ref 22–32)
Chloride: 97 mmol/L — ABNORMAL LOW (ref 98–111)
Creatinine, Ser: 0.84 mg/dL (ref 0.44–1.00)
Glucose, Bld: 94 mg/dL (ref 70–99)
Potassium: 3.5 mmol/L (ref 3.5–5.1)
Sodium: 136 mmol/L (ref 135–145)

## 2018-06-28 LAB — MRSA PCR SCREENING: MRSA by PCR: NEGATIVE

## 2018-06-28 MED ORDER — LEFLUNOMIDE 20 MG PO TABS
20.0000 mg | ORAL_TABLET | Freq: Every day | ORAL | Status: DC
  Administered 2018-06-28: 20 mg via ORAL
  Filled 2018-06-28 (×3): qty 1

## 2018-06-28 MED ORDER — TRAMADOL HCL 50 MG PO TABS: 50.0000 mg | ORAL_TABLET | Freq: Four times a day (QID) | ORAL | Status: DC | PRN

## 2018-06-28 MED ORDER — METOPROLOL TARTRATE 25 MG PO TABS: 25.0000 mg | ORAL_TABLET | Freq: Two times a day (BID) | ORAL | 0 refills | Status: AC

## 2018-06-28 MED ORDER — FLUTICASONE PROPIONATE 50 MCG/ACT NA SUSP
2.0000 | Freq: Every day | NASAL | Status: DC
  Administered 2018-06-28: 2 via NASAL
  Filled 2018-06-28: qty 16

## 2018-06-28 MED ORDER — ADULT MULTIVITAMIN W/MINERALS CH
1.0000 | ORAL_TABLET | Freq: Every day | ORAL | Status: DC
  Filled 2018-06-28: qty 1

## 2018-06-28 MED ORDER — VITAMIN D 1000 UNITS PO TABS
1000.0000 [IU] | ORAL_TABLET | Freq: Every day | ORAL | Status: DC
  Administered 2018-06-28: 1000 [IU] via ORAL
  Filled 2018-06-28: qty 1

## 2018-06-28 MED ORDER — ENSURE ENLIVE PO LIQD: 237.0000 mL | Freq: Two times a day (BID) | ORAL | 0 refills | Status: AC

## 2018-06-28 MED ORDER — AMLODIPINE BESYLATE 5 MG PO TABS: 5.0000 mg | ORAL_TABLET | Freq: Every day | ORAL | 0 refills | Status: AC

## 2018-06-28 MED ORDER — ALPRAZOLAM 0.5 MG PO TABS
0.5000 mg | ORAL_TABLET | Freq: Two times a day (BID) | ORAL | Status: DC | PRN
  Administered 2018-06-28: 0.5 mg via ORAL
  Filled 2018-06-28: qty 1

## 2018-06-28 MED ORDER — AMLODIPINE BESYLATE 5 MG PO TABS
5.0000 mg | ORAL_TABLET | Freq: Every day | ORAL | Status: DC
Start: 2018-06-28 — End: 2018-06-28
  Administered 2018-06-28: 5 mg via ORAL
  Filled 2018-06-28 (×3): qty 1

## 2018-06-28 MED ORDER — METOPROLOL TARTRATE 25 MG PO TABS
25.0000 mg | ORAL_TABLET | Freq: Two times a day (BID) | ORAL | Status: DC
  Administered 2018-06-28: 25 mg via ORAL
  Filled 2018-06-28: qty 1

## 2018-06-28 MED ORDER — MOMETASONE FURO-FORMOTEROL FUM 200-5 MCG/ACT IN AERO
2.0000 | INHALATION_SPRAY | Freq: Two times a day (BID) | RESPIRATORY_TRACT | Status: DC
  Administered 2018-06-28: 2 via RESPIRATORY_TRACT
  Filled 2018-06-28: qty 8.8

## 2018-06-28 MED ORDER — ALBUTEROL SULFATE (2.5 MG/3ML) 0.083% IN NEBU: 3.0000 mL | INHALATION_SOLUTION | Freq: Four times a day (QID) | RESPIRATORY_TRACT | Status: DC | PRN

## 2018-06-28 NOTE — Discharge Summary (Signed)
Physician Discharge Summary  [name] [name]:[id number] DOB: [birth year] DOA: 06/26/2018  PCP: Richmond Campbell., PA-C  Admit date: 06/26/2018 Discharge date: 06/28/2018  Admitted From: HOME  Disposition: HOME  Recommendations for Outpatient Follow-up:  1. Follow up with PCP in 1 weeks.  2. Follow up with cardiologist in 2 weeks.  3. Please obtain BMP/CBC in 1-2 weeks  Discharge Condition: STABLE   CODE STATUS: FULL    Brief Hospitalization Summary: Please see all hospital notes, images, labs for full details of the hospitalization. HPI: [name] [name] is a 84 y.o. female with medical history significant of anxiety, rheumatoid arthritis, COPD, hypertension who is coming to the emergency department after having a fall at home and injuring her chest wall.  She also started having a frontal headache about 2000 which has persisted.  She denies double or blurred vision.  Denies focal numbness or weakness.  However, she has felt nauseous and lightheaded.  She denies fever, chills, sore throat, dyspnea, wheezing, hemoptysis, chest pain, palpitations, dizziness, diaphoresis, PND, orthopnea, but occasionally gets mild lower extremities edema.  No abdominal pain, diarrhea, constipation, melena or hematochezia.  Denies dysuria, frequency or hematuria.  No polyuria, polydipsia or blurred vision.  No heat or cold intolerance.  ED Course: Initial vital signs temperature 98.1 F, pulse 89, respirations 18, blood pressure 224/101 mmHg and O2 sat 99% on room air.  The patient was given clonidine 0.1 mg x 2 in the emergency department without significant response.  However, she seems to have thrown up the first tablet of clonidine when she vomited in the emergency department.  I ordered hydralazine 10 mg plus lorazepam 0.5 mg in the ED.  Her urine analysis was colorless, with increased pH of 9.0 and ketones of 5 mg/dL.  Sodium 135, potassium 3.5, chloride 96 and CO2 27 mmol/L.  BUN 8, creatinine 0.72 and  glucose 134 mg/dL. EKG NSR.  Rib series and CT head without contrast did not show any acute abnormalities.  Echocardiogram 06/27/18:  Study Conclusions - Left ventricle: The cavity size was normal. Wall thickness was  increased in a pattern of mild LVH. Systolic function was normal.   The estimated ejection fraction was in the range of 60% to 65%.   Wall motion was normal; there were no regional wall motion abnormalities. Doppler parameters are consistent with abnormal left ventricular relaxation (grade 1 diastolic dysfunction).  Indeterminate filling pressures. - Atrial septum: No defect or patent foramen ovale was identified.   Hypertensive urgency Patient was placed on observation/telemetry and monitored.  She remained stable.  Her blood pressure was treated. She was started on metoprolol 12.5 mg twice daily which was titrated to 25 mg twice daily.  She tolerated it.  In addition, amlodipine 5 mg daily was added to her regimen. CT brain did not show any acute abnormalities. Resumed ramipril 20 mg daily.  Her blood pressure started trending down and her headaches have resolved.  She will discharge home to follow up with her PCP and cardiologist for further evaluation and management.  COPD (chronic obstructive pulmonary disease) STABLE.  Bronchodilators and supplemental oxygen as needed. Incentive spirometry to avoid atelectasis after chest wall trauma.    Anxiety Resumed home anxiolytic therapy. This is helping with the patient's blood pressure.    Headache RESOLVED CT head negative for acute findings    DVT prophylaxis: Lovenox SQ. Code Status: Full code. Family Communication: Her husband, son and daughter  Disposition Plan: DISCHARGE HOME WITH CLOSE OUTPATIENT FOLLOW UP  Consults called:  Admission status: Observation/stepdown.  Discharge Diagnoses:  Principal Problem:   Hypertensive urgency Active Problems:   COPD (chronic obstructive pulmonary disease) (HCC)    Anxiety   Headache  Discharge Instructions: Discharge Instructions    Call MD for:  difficulty breathing, headache or visual disturbances   Complete by:  As directed    Call MD for:  extreme fatigue   Complete by:  As directed    Call MD for:  hives   Complete by:  As directed    Call MD for:  persistant dizziness or light-headedness   Complete by:  As directed    Call MD for:  persistant nausea and vomiting   Complete by:  As directed    Call MD for:  severe uncontrolled pain   Complete by:  As directed    Diet - low sodium heart healthy   Complete by:  As directed    Increase activity slowly   Complete by:  As directed      Allergies as of 06/28/2018      Reactions   Cortisone Other (See Comments)   Irregular Heart Beat   Morphine And Related Nausea And Vomiting   Motrin [ibuprofen]    Sulfa Antibiotics Hives   Vicodin [hydrocodone-acetaminophen] Nausea And Vomiting   Penicillins Hives   Has patient had a PCN reaction causing immediate rash, facial/tongue/throat swelling, SOB or lightheadedness with hypotension: Yes Has patient had a PCN reaction causing severe rash involving mucus membranes or skin necrosis: No Has patient had a PCN reaction that required hospitalization No Has patient had a PCN reaction occurring within the last 10 years: No If all of the above answers are "NO", then may proceed with Cephalosporin use.      Medication List    TAKE these medications   albuterol 108 (90 Base) MCG/ACT inhaler Commonly known as:  PROVENTIL HFA;VENTOLIN HFA Inhale 2 puffs into the lungs every 6 (six) hours as needed for wheezing or shortness of breath.   ALPRAZolam 0.5 MG tablet Commonly known as:  XANAX Take 0.5 mg by mouth 3 (three) times daily.   amLODipine 5 MG tablet Commonly known as:  NORVASC Take 1 tablet (5 mg total) by mouth daily. Start taking on:  06/29/2018   cholecalciferol 1000 units tablet Commonly known as:  VITAMIN D Take 1,000 Units by mouth  daily.   clotrimazole-betamethasone cream Commonly known as:  LOTRISONE Apply to affected area twice daily   feeding supplement (ENSURE ENLIVE) Liqd Take 237 mLs by mouth 2 (two) times daily between meals.   fluticasone 50 MCG/ACT nasal spray Commonly known as:  FLONASE Place 2 sprays into both nostrils daily.   leflunomide 20 MG tablet Commonly known as:  ARAVA Take 20 mg by mouth daily.   metoprolol tartrate 25 MG tablet Commonly known as:  LOPRESSOR Take 1 tablet (25 mg total) by mouth 2 (two) times daily.   multivitamin with minerals Tabs tablet Take 1 tablet by mouth daily.   ramipril 10 MG capsule Commonly known as:  ALTACE Take 20 mg by mouth daily.   SYMBICORT 160-4.5 MCG/ACT inhaler Generic drug:  budesonide-formoterol Inhale 2 puffs into the lungs 2 (two) times daily.   traMADol 50 MG tablet Commonly known as:  ULTRAM Take 50 mg by mouth 4 (four) times daily as needed for moderate pain.      Follow-up Information    Richmond Campbell., PA-C. Schedule an appointment as soon as possible for a visit  in 1 week(s).   Specialty:  Family Medicine Why:  Hospital Follow Up and check blood pressure Contact information: [address] [place] [address] [phone]        Laqueta Linden, MD. Schedule an appointment as soon as possible for a visit in 2 week(s).   Specialty:  Cardiology Why:  Hospital Follow Up and check blood pressure Contact information: [address] [place] [address] [phone]          Allergies  Allergen Reactions  . Cortisone Other (See Comments)    Irregular Heart Beat  . Morphine And Related Nausea And Vomiting  . Motrin [Ibuprofen]   . Sulfa Antibiotics Hives  . Vicodin [Hydrocodone-Acetaminophen] Nausea And Vomiting  . Penicillins Hives    Has patient had a PCN reaction causing immediate rash, facial/tongue/throat swelling, SOB or lightheadedness with hypotension: Yes Has patient had a PCN reaction  causing severe rash involving mucus membranes or skin necrosis: No Has patient had a PCN reaction that required hospitalization No Has patient had a PCN reaction occurring within the last 10 years: No If all of the above answers are "NO", then may proceed with Cephalosporin use.    Allergies as of 06/28/2018      Reactions   Cortisone Other (See Comments)   Irregular Heart Beat   Morphine And Related Nausea And Vomiting   Motrin [ibuprofen]    Sulfa Antibiotics Hives   Vicodin [hydrocodone-acetaminophen] Nausea And Vomiting   Penicillins Hives   Has patient had a PCN reaction causing immediate rash, facial/tongue/throat swelling, SOB or lightheadedness with hypotension: Yes Has patient had a PCN reaction causing severe rash involving mucus membranes or skin necrosis: No Has patient had a PCN reaction that required hospitalization No Has patient had a PCN reaction occurring within the last 10 years: No If all of the above answers are "NO", then may proceed with Cephalosporin use.      Medication List    TAKE these medications   albuterol 108 (90 Base) MCG/ACT inhaler Commonly known as:  PROVENTIL HFA;VENTOLIN HFA Inhale 2 puffs into the lungs every 6 (six) hours as needed for wheezing or shortness of breath.   ALPRAZolam 0.5 MG tablet Commonly known as:  XANAX Take 0.5 mg by mouth 3 (three) times daily.   amLODipine 5 MG tablet Commonly known as:  NORVASC Take 1 tablet (5 mg total) by mouth daily. Start taking on:  06/29/2018   cholecalciferol 1000 units tablet Commonly known as:  VITAMIN D Take 1,000 Units by mouth daily.   clotrimazole-betamethasone cream Commonly known as:  LOTRISONE Apply to affected area twice daily   feeding supplement (ENSURE ENLIVE) Liqd Take 237 mLs by mouth 2 (two) times daily between meals.   fluticasone 50 MCG/ACT nasal spray Commonly known as:  FLONASE Place 2 sprays into both nostrils daily.   leflunomide 20 MG tablet Commonly known  as:  ARAVA Take 20 mg by mouth daily.   metoprolol tartrate 25 MG tablet Commonly known as:  LOPRESSOR Take 1 tablet (25 mg total) by mouth 2 (two) times daily.   multivitamin with minerals Tabs tablet Take 1 tablet by mouth daily.   ramipril 10 MG capsule Commonly known as:  ALTACE Take 20 mg by mouth daily.   SYMBICORT 160-4.5 MCG/ACT inhaler Generic drug:  budesonide-formoterol Inhale 2 puffs into the lungs 2 (two) times daily.   traMADol 50 MG tablet Commonly known as:  ULTRAM Take 50 mg by mouth 4 (four)  times daily as needed for moderate pain.       Procedures/Studies: Dg Ribs Unilateral W/chest Left  Result Date: 06/27/2018 CLINICAL DATA:  Patient fell today striking the left anterior lower ribs. Pain to that area. EXAM: LEFT RIBS AND CHEST - 3+ VIEW COMPARISON:  Chest 04/06/2016 FINDINGS: Normal heart size and pulmonary vascularity. Peribronchial thickening suggesting chronic bronchitis. No focal airspace disease or consolidation in the lungs. No blunting of costophrenic angles. No pneumothorax. Mediastinal contours appear intact. Calcified and tortuous aorta. Postoperative changes in the left humerus. Left ribs appear intact. No acute displaced fractures identified. No focal bone lesion or bone destruction. Soft tissues are unremarkable. Degenerative changes in the spine. IMPRESSION: Chronic bronchitic changes in the lungs. No evidence of active pulmonary disease. Negative left ribs. Electronically Signed   By: Burman Nieves M.D.   On: 06/27/2018 01:23   Ct Head Wo Contrast  Result Date: 06/27/2018 CLINICAL DATA:  Altered mental status EXAM: CT HEAD WITHOUT CONTRAST TECHNIQUE: Contiguous axial images were obtained from the base of the skull through the vertex without intravenous contrast. COMPARISON:  04/25/2018 FINDINGS: Brain: Chronic atrophic changes are noted with chronic white matter ischemic changes stable from the prior exam. No findings to suggest acute hemorrhage,  acute infarction or space-occupying mass lesion are noted. Vascular: No hyperdense vessel or unexpected calcification. Skull: Normal. Negative for fracture or focal lesion. Sinuses/Orbits: No acute finding. Other: None. IMPRESSION: Chronic atrophic and ischemic changes without acute abnormality. Electronically Signed   By: Alcide Clever M.D.   On: 06/27/2018 02:42      Subjective: Patient says she feels a lot better and she is no longer having chest pain or headaches.  She seems to be tolerating the blood pressure medications and her blood pressure is coming down.  Discharge Exam: Vitals:   06/28/18 0544 06/28/18 1104  BP: (!) 189/87 (!) 155/72  Pulse: 69 66  Resp: 18   Temp: 98.5 F (36.9 C)   SpO2: 94%    Vitals:   06/27/18 1321 06/27/18 2012 06/28/18 0544 06/28/18 1104  BP: (!) 170/77 (!) 175/89 (!) 189/87 (!) 155/72  Pulse: 72 77 69 66  Resp: 20 18 18    Temp: 98.9 F (37.2 C) 99.4 F (37.4 C) 98.5 F (36.9 C)   TempSrc: Oral Oral Oral   SpO2: 96% 95% 94%   Weight:      Height:       General: Pt is alert, awake, not in acute distress, thin. Appears younger than stated age.  Cardiovascular: normal S1/S2 +, no rubs, no gallops Respiratory: CTA bilaterally, no wheezing, no rhonchi Abdominal: Soft, NT, ND, bowel sounds + Extremities: no edema, no cyanosis   The results of significant diagnostics from this hospitalization (including imaging, microbiology, ancillary and laboratory) are listed below for reference.     Microbiology: Recent Results (from the past 240 hour(s))  MRSA PCR Screening     Status: None   Collection Time: 06/27/18  5:39 AM  Result Value Ref Range Status   MRSA by PCR NEGATIVE NEGATIVE Final    Comment:        The GeneXpert MRSA Assay (FDA approved for NASAL specimens only), is one component of a comprehensive MRSA colonization surveillance program. It is not intended to diagnose MRSA infection nor to guide or monitor treatment for MRSA  infections. Performed at [facility], [address] [place], [place] [address]      Labs: BNP (last 3 results) No results for input(s):  BNP in the last 8760 hours. Basic Metabolic Panel: Recent Labs  Lab 06/27/18 0248 06/28/18 0510  NA 135 136  K 3.5 3.5  CL 96* 97*  CO2 27 29  GLUCOSE 134* 94  BUN 8 15  CREATININE 0.72 0.84  CALCIUM 9.6 9.3  MG 1.8  --   PHOS 3.0  --    Liver Function Tests: No results for input(s): AST, ALT, ALKPHOS, BILITOT, PROT, ALBUMIN in the last 168 hours. No results for input(s): LIPASE, AMYLASE in the last 168 hours. No results for input(s): AMMONIA in the last 168 hours. CBC: Recent Labs  Lab 06/27/18 0248  WBC 5.7  NEUTROABS 3.8  HGB 14.2  HCT 43.6  MCV 89.9  PLT 222   Cardiac Enzymes: No results for input(s): CKTOTAL, CKMB, CKMBINDEX, TROPONINI in the last 168 hours. BNP: Invalid input(s): POCBNP CBG: No results for input(s): GLUCAP in the last 168 hours. D-Dimer No results for input(s): DDIMER in the last 72 hours. Hgb A1c No results for input(s): HGBA1C in the last 72 hours. Lipid Profile No results for input(s): CHOL, HDL, LDLCALC, TRIG, CHOLHDL, LDLDIRECT in the last 72 hours. Thyroid function studies No results for input(s): TSH, T4TOTAL, T3FREE, THYROIDAB in the last 72 hours.  Invalid input(s): FREET3 Anemia work up No results for input(s): VITAMINB12, FOLATE, FERRITIN, TIBC, IRON, RETICCTPCT in the last 72 hours. Urinalysis    Component Value Date/Time   COLORURINE COLORLESS (A) 06/27/2018 0415   APPEARANCEUR CLEAR 06/27/2018 0415   LABSPEC 1.006 06/27/2018 0415   PHURINE 9.0 (H) 06/27/2018 0415   GLUCOSEU NEGATIVE 06/27/2018 0415   HGBUR NEGATIVE 06/27/2018 0415   BILIRUBINUR NEGATIVE 06/27/2018 0415   KETONESUR 5 (A) 06/27/2018 0415   PROTEINUR NEGATIVE 06/27/2018 0415   NITRITE NEGATIVE 06/27/2018 0415   LEUKOCYTESUR NEGATIVE 06/27/2018 0415   Sepsis Labs Invalid input(s): PROCALCITONIN,  WBC,   LACTICIDVEN Microbiology Recent Results (from the past 240 hour(s))  MRSA PCR Screening     Status: None   Collection Time: 06/27/18  5:39 AM  Result Value Ref Range Status   MRSA by PCR NEGATIVE NEGATIVE Final    Comment:        The GeneXpert MRSA Assay (FDA approved for NASAL specimens only), is one component of a comprehensive MRSA colonization surveillance program. It is not intended to diagnose MRSA infection nor to guide or monitor treatment for MRSA infections. Performed at [facility], [address], [place], [place] [address]    Time coordinating discharge:   SIGNED:  Standley Dakins, MD  [facility] 06/28/2018, 11:32 AM Pager [phone]  If 7PM-7AM, please contact night-coverage [email or web address] Password [email or web address]

## 2018-06-28 NOTE — Discharge Instructions (Signed)
Follow with Primary MD  Richmond Campbell., PA-C  and other consultants as instructed your Hospitalist MD  Please get a complete blood count and chemistry panel checked by your Primary MD at your next visit, and again as instructed by your Primary MD.  Get Medicines reviewed and adjusted: Please take all your medications with you for your next visit with your Primary MD  Laboratory/radiological data: Please request your Primary MD to go over all hospital tests and procedure/radiological results at the follow up, please ask your Primary MD to get all Hospital records sent to his/her office.  In some cases, they will be blood work, cultures and biopsy results pending at the time of your discharge. Please request that your primary care M.D. follows up on these results.  Also Note the following: If you experience worsening of your admission symptoms, develop shortness of breath, life threatening emergency, suicidal or homicidal thoughts you must seek medical attention immediately by calling 911 or calling your MD immediately  if symptoms less severe.  You must read complete instructions/literature along with all the possible adverse reactions/side effects for all the Medicines you take and that have been prescribed to you. Take any new Medicines after you have completely understood and accpet all the possible adverse reactions/side effects.   Do not drive when taking Pain medications or sleeping medications (Benzodaizepines)  Do not take more than prescribed Pain, Sleep and Anxiety Medications. It is not advisable to combine anxiety,sleep and pain medications without talking with your primary care practitioner  Special Instructions: If you have smoked or chewed Tobacco  in the last 2 yrs please stop smoking, stop any regular Alcohol  and or any Recreational drug use.  Wear Seat belts while driving.  Please note: You were cared for by a hospitalist during your hospital stay. Once you are  discharged, your primary care physician will handle any further medical issues. Please note that NO REFILLS for any discharge medications will be authorized once you are discharged, as it is imperative that you return to your primary care physician (or establish a relationship with a primary care physician if you do not have one) for your post hospital discharge needs so that they can reassess your need for medications and monitor your lab values.      How to Take Your Blood Pressure Blood pressure is a measurement of how strongly your blood is pressing against the walls of your arteries. Arteries are blood vessels that carry blood from your heart throughout your body. Your health care provider takes your blood pressure at each office visit. You can also take your own blood pressure at home with a blood pressure machine. You may need to take your own blood pressure:  To confirm a diagnosis of high blood pressure (hypertension).  To monitor your blood pressure over time.  To make sure your blood pressure medicine is working.  Supplies needed: To take your blood pressure, you will need a blood pressure machine. You can buy a blood pressure machine, or blood pressure monitor, at most drugstores or online. There are several types of home blood pressure monitors. When choosing one, consider the following:  Choose a monitor that has an arm cuff.  Choose a monitor that wraps snugly around your upper arm. You should be able to fit only one finger between your arm and the cuff.  Do not choose a monitor that measures your blood pressure from your wrist or finger.  Your health care provider can suggest  a reliable monitor that will meet your needs. How to prepare To get the most accurate reading, avoid the following for 30 minutes before you check your blood pressure:  Drinking caffeine.  Drinking alcohol.  Eating.  Smoking.  Exercising.  Five minutes before you check your blood  pressure:  Empty your bladder.  Sit quietly without talking in a dining chair, rather than in a soft couch or armchair.  How to take your blood pressure To check your blood pressure, follow the instructions in the manual that came with your blood pressure monitor. If you have a digital blood pressure monitor, the instructions may be as follows: 1. Sit up straight. 2. Place your feet on the floor. Do not cross your ankles or legs. 3. Rest your left arm at the level of your heart on a table or desk or on the arm of a chair. 4. Pull up your shirt sleeve. 5. Wrap the blood pressure cuff around the upper part of your left arm, 1 inch (2.5 cm) above your elbow. It is best to wrap the cuff around bare skin. 6. Fit the cuff snugly around your arm. You should be able to place only one finger between the cuff and your arm. 7. Position the cord inside the groove of your elbow. 8. Press the power button. 9. Sit quietly while the cuff inflates and deflates. 10. Read the digital reading on the monitor screen and write it down (record it). 11. Wait 2-3 minutes, then repeat the steps, starting at step 1.  What does my blood pressure reading mean? A blood pressure reading consists of a higher number over a lower number. Ideally, your blood pressure should be below 120/80. The first ("top") number is called the systolic pressure. It is a measure of the pressure in your arteries as your heart beats. The second ("bottom") number is called the diastolic pressure. It is a measure of the pressure in your arteries as the heart relaxes. Blood pressure is classified into four stages. The following are the stages for adults who do not have a short-term serious illness or a chronic condition. Systolic pressure and diastolic pressure are measured in a unit called mm Hg. Normal  Systolic pressure: below 120.  Diastolic pressure: below 80. Elevated  Systolic pressure: 120-129.  Diastolic pressure: below  80. Hypertension stage 1  Systolic pressure: 130-139.  Diastolic pressure: 80-89. Hypertension stage 2  Systolic pressure: 140 or above.  Diastolic pressure: 90 or above. You can have prehypertension or hypertension even if only the systolic or only the diastolic number in your reading is higher than normal. Follow these instructions at home:  Check your blood pressure as often as recommended by your health care provider.  Take your monitor to the next appointment with your health care provider to make sure: ? That you are using it correctly. ? That it provides accurate readings.  Be sure you understand what your goal blood pressure numbers are.  Tell your health care provider if you are having any side effects from blood pressure medicine. Contact a health care provider if:  Your blood pressure is consistently high. Get help right away if:  Your systolic blood pressure is higher than 180.  Your diastolic blood pressure is higher than 110. This information is not intended to replace advice given to you by your health care provider. Make sure you discuss any questions you have with your health care provider. Document Released: 04/09/2016 Document Revised: 06/22/2016 Document Reviewed: 04/09/2016 Elsevier  Interactive Patient Education  [employer].  Hypertension Hypertension, commonly called high blood pressure, is when the force of blood pumping through the arteries is too strong. The arteries are the blood vessels that carry blood from the heart throughout the body. Hypertension forces the heart to work harder to pump blood and may cause arteries to become narrow or stiff. Having untreated or uncontrolled hypertension can cause heart attacks, strokes, kidney disease, and other problems. A blood pressure reading consists of a higher number over a lower number. Ideally, your blood pressure should be below 120/80. The first ("top") number is called the systolic pressure. It  is a measure of the pressure in your arteries as your heart beats. The second ("bottom") number is called the diastolic pressure. It is a measure of the pressure in your arteries as the heart relaxes. What are the causes? The cause of this condition is not known. What increases the risk? Some risk factors for high blood pressure are under your control. Others are not. Factors you can change  Smoking.  Having type 2 diabetes mellitus, high cholesterol, or both.  Not getting enough exercise or physical activity.  Being overweight.  Having too much fat, sugar, calories, or salt (sodium) in your diet.  Drinking too much alcohol. Factors that are difficult or impossible to change  Having chronic kidney disease.  Having a family history of high blood pressure.  Age. Risk increases with age.  Race. You may be at higher risk if you are African-American.  Gender. Men are at higher risk than women before age 74. After age 25, women are at higher risk than men.  Having obstructive sleep apnea.  Stress. What are the signs or symptoms? Extremely high blood pressure (hypertensive crisis) may cause:  Headache.  Anxiety.  Shortness of breath.  Nosebleed.  Nausea and vomiting.  Severe chest pain.  Jerky movements you cannot control (seizures).  How is this diagnosed? This condition is diagnosed by measuring your blood pressure while you are seated, with your arm resting on a surface. The cuff of the blood pressure monitor will be placed directly against the skin of your upper arm at the level of your heart. It should be measured at least twice using the same arm. Certain conditions can cause a difference in blood pressure between your right and left arms. Certain factors can cause blood pressure readings to be lower or higher than normal (elevated) for a short period of time:  When your blood pressure is higher when you are in a health care provider's office than when you are at  home, this is called white coat hypertension. Most people with this condition do not need medicines.  When your blood pressure is higher at home than when you are in a health care provider's office, this is called masked hypertension. Most people with this condition may need medicines to control blood pressure.  If you have a high blood pressure reading during one visit or you have normal blood pressure with other risk factors:  You may be asked to return on a different day to have your blood pressure checked again.  You may be asked to monitor your blood pressure at home for 1 week or longer.  If you are diagnosed with hypertension, you may have other blood or imaging tests to help your health care provider understand your overall risk for other conditions. How is this treated? This condition is treated by making healthy lifestyle changes, such as eating healthy  foods, exercising more, and reducing your alcohol intake. Your health care provider may prescribe medicine if lifestyle changes are not enough to get your blood pressure under control, and if:  Your systolic blood pressure is above 130.  Your diastolic blood pressure is above 80.  Your personal target blood pressure may vary depending on your medical conditions, your age, and other factors. Follow these instructions at home: Eating and drinking  Eat a diet that is high in fiber and potassium, and low in sodium, added sugar, and fat. An example eating plan is called the DASH (Dietary Approaches to Stop Hypertension) diet. To eat this way: ? Eat plenty of fresh fruits and vegetables. Try to fill half of your plate at each meal with fruits and vegetables. ? Eat whole grains, such as whole wheat pasta, brown rice, or whole grain bread. Fill about one quarter of your plate with whole grains. ? Eat or drink low-fat dairy products, such as skim milk or low-fat yogurt. ? Avoid fatty cuts of meat, processed or cured meats, and poultry with  skin. Fill about one quarter of your plate with lean proteins, such as fish, chicken without skin, beans, eggs, and tofu. ? Avoid premade and processed foods. These tend to be higher in sodium, added sugar, and fat.  Reduce your daily sodium intake. Most people with hypertension should eat less than 1,500 mg of sodium a day.  Limit alcohol intake to no more than 1 drink a day for nonpregnant women and 2 drinks a day for men. One drink equals 12 oz of beer, 5 oz of wine, or 1 oz of hard liquor. Lifestyle  Work with your health care provider to maintain a healthy body weight or to lose weight. Ask what an ideal weight is for you.  Get at least 30 minutes of exercise that causes your heart to beat faster (aerobic exercise) most days of the week. Activities may include walking, swimming, or biking.  Include exercise to strengthen your muscles (resistance exercise), such as pilates or lifting weights, as part of your weekly exercise routine. Try to do these types of exercises for 30 minutes at least 3 days a week.  Do not use any products that contain nicotine or tobacco, such as cigarettes and e-cigarettes. If you need help quitting, ask your health care provider.  Monitor your blood pressure at home as told by your health care provider.  Keep all follow-up visits as told by your health care provider. This is important. Medicines  Take over-the-counter and prescription medicines only as told by your health care provider. Follow directions carefully. Blood pressure medicines must be taken as prescribed.  Do not skip doses of blood pressure medicine. Doing this puts you at risk for problems and can make the medicine less effective.  Ask your health care provider about side effects or reactions to medicines that you should watch for. Contact a health care provider if:  You think you are having a reaction to a medicine you are taking.  You have headaches that keep coming back  (recurring).  You feel dizzy.  You have swelling in your ankles.  You have trouble with your vision. Get help right away if:  You develop a severe headache or confusion.  You have unusual weakness or numbness.  You feel faint.  You have severe pain in your chest or abdomen.  You vomit repeatedly.  You have trouble breathing. Summary  Hypertension is when the force of blood pumping through your  arteries is too strong. If this condition is not controlled, it may put you at risk for serious complications.  Your personal target blood pressure may vary depending on your medical conditions, your age, and other factors. For most people, a normal blood pressure is less than 120/80.  Hypertension is treated with lifestyle changes, medicines, or a combination of both. Lifestyle changes include weight loss, eating a healthy, low-sodium diet, exercising more, and limiting alcohol. This information is not intended to replace advice given to you by your health care provider. Make sure you discuss any questions you have with your health care provider. Document Released: 11/01/2005 Document Revised: 09/29/2016 Document Reviewed: 09/29/2016 Elsevier Interactive Patient Education  2018 [employer].   DASH Eating Plan DASH stands for "Dietary Approaches to Stop Hypertension." The DASH eating plan is a healthy eating plan that has been shown to reduce high blood pressure (hypertension). It may also reduce your risk for type 2 diabetes, heart disease, and stroke. The DASH eating plan may also help with weight loss. What are tips for following this plan? General guidelines  Avoid eating more than 2,300 mg (milligrams) of salt (sodium) a day. If you have hypertension, you may need to reduce your sodium intake to 1,500 mg a day.  Limit alcohol intake to no more than 1 drink a day for nonpregnant women and 2 drinks a day for men. One drink equals 12 oz of beer, 5 oz of wine, or 1 oz of hard  liquor.  Work with your health care provider to maintain a healthy body weight or to lose weight. Ask what an ideal weight is for you.  Get at least 30 minutes of exercise that causes your heart to beat faster (aerobic exercise) most days of the week. Activities may include walking, swimming, or biking.  Work with your health care provider or diet and nutrition specialist (dietitian) to adjust your eating plan to your individual calorie needs. Reading food labels  Check food labels for the amount of sodium per serving. Choose foods with less than 5 percent of the Daily Value of sodium. Generally, foods with less than 300 mg of sodium per serving fit into this eating plan.  To find whole grains, look for the word "whole" as the first word in the ingredient list. Shopping  Buy products labeled as "low-sodium" or "no salt added."  Buy fresh foods. Avoid canned foods and premade or frozen meals. Cooking  Avoid adding salt when cooking. Use salt-free seasonings or herbs instead of table salt or sea salt. Check with your health care provider or pharmacist before using salt substitutes.  Do not fry foods. Cook foods using healthy methods such as baking, boiling, grilling, and broiling instead.  Cook with heart-healthy oils, such as olive, canola, soybean, or sunflower oil. Meal planning   Eat a balanced diet that includes: ? 5 or more servings of fruits and vegetables each day. At each meal, try to fill half of your plate with fruits and vegetables. ? Up to 6-8 servings of whole grains each day. ? Less than 6 oz of lean meat, poultry, or fish each day. A 3-oz serving of meat is about the same size as a deck of cards. One egg equals 1 oz. ? 2 servings of low-fat dairy each day. ? A serving of nuts, seeds, or beans 5 times each week. ? Heart-healthy fats. Healthy fats called Omega-3 fatty acids are found in foods such as flaxseeds and coldwater fish, like sardines, salmon,  and  mackerel.  Limit how much you eat of the following: ? Canned or prepackaged foods. ? Food that is high in trans fat, such as fried foods. ? Food that is high in saturated fat, such as fatty meat. ? Sweets, desserts, sugary drinks, and other foods with added sugar. ? Full-fat dairy products.  Do not salt foods before eating.  Try to eat at least 2 vegetarian meals each week.  Eat more home-cooked food and less restaurant, buffet, and fast food.  When eating at a restaurant, ask that your food be prepared with less salt or no salt, if possible. What foods are recommended? The items listed may not be a complete list. Talk with your dietitian about what dietary choices are best for you. Grains Whole-grain or whole-wheat bread. Whole-grain or whole-wheat pasta. Brown rice. Orpah Cobb. Bulgur. Whole-grain and low-sodium cereals. Pita bread. Low-fat, low-sodium crackers. Whole-wheat flour tortillas. Vegetables Fresh or frozen vegetables (raw, steamed, roasted, or grilled). Low-sodium or reduced-sodium tomato and vegetable juice. Low-sodium or reduced-sodium tomato sauce and tomato paste. Low-sodium or reduced-sodium canned vegetables. Fruits All fresh, dried, or frozen fruit. Canned fruit in natural juice (without added sugar). Meat and other protein foods Skinless chicken or [place]. Ground chicken or [place]. Pork with fat trimmed off. Fish and seafood. Egg whites. Dried beans, peas, or lentils. Unsalted nuts, nut butters, and seeds. Unsalted canned beans. Lean cuts of beef with fat trimmed off. Low-sodium, lean deli meat. Dairy Low-fat (1%) or fat-free (skim) milk. Fat-free, low-fat, or reduced-fat cheeses. Nonfat, low-sodium ricotta or cottage cheese. Low-fat or nonfat yogurt. Low-fat, low-sodium cheese. Fats and oils Soft margarine without trans fats. Vegetable oil. Low-fat, reduced-fat, or light mayonnaise and salad dressings (reduced-sodium). Canola, safflower, olive, soybean, and  sunflower oils. Avocado. Seasoning and other foods Herbs. Spices. Seasoning mixes without salt. Unsalted popcorn and pretzels. Fat-free sweets. What foods are not recommended? The items listed may not be a complete list. Talk with your dietitian about what dietary choices are best for you. Grains Baked goods made with fat, such as croissants, muffins, or some breads. Dry pasta or rice meal packs. Vegetables Creamed or fried vegetables. Vegetables in a cheese sauce. Regular canned vegetables (not low-sodium or reduced-sodium). Regular canned tomato sauce and paste (not low-sodium or reduced-sodium). Regular tomato and vegetable juice (not low-sodium or reduced-sodium). Rosita Fire. Olives. Fruits Canned fruit in a light or heavy syrup. Fried fruit. Fruit in cream or butter sauce. Meat and other protein foods Fatty cuts of meat. Ribs. Fried meat. Tomasa Blase. Sausage. Bologna and other processed lunch meats. Salami. Fatback. Hotdogs. Bratwurst. Salted nuts and seeds. Canned beans with added salt. Canned or smoked fish. Whole eggs or egg yolks. Chicken or [place] with skin. Dairy Whole or 2% milk, cream, and half-and-half. Whole or full-fat cream cheese. Whole-fat or sweetened yogurt. Full-fat cheese. Nondairy creamers. Whipped toppings. Processed cheese and cheese spreads. Fats and oils Butter. Stick margarine. Lard. Shortening. Ghee. Bacon fat. Tropical oils, such as coconut, palm kernel, or palm oil. Seasoning and other foods Salted popcorn and pretzels. Onion salt, garlic salt, seasoned salt, table salt, and sea salt. Worcestershire sauce. Tartar sauce. Barbecue sauce. Teriyaki sauce. Soy sauce, including reduced-sodium. Steak sauce. Canned and packaged gravies. Fish sauce. Oyster sauce. Cocktail sauce. Horseradish that you find on the shelf. Ketchup. Mustard. Meat flavorings and tenderizers. Bouillon cubes. Hot sauce and Tabasco sauce. Premade or packaged marinades. Premade or packaged taco seasonings.  Relishes. Regular salad dressings. Where to find more information:  [employer]  Heart, Lung, and Blood Institute: [employer].is  American Heart Association: www.heart.org Summary  The DASH eating plan is a healthy eating plan that has been shown to reduce high blood pressure (hypertension). It may also reduce your risk for type 2 diabetes, heart disease, and stroke.  With the DASH eating plan, you should limit salt (sodium) intake to 2,300 mg a day. If you have hypertension, you may need to reduce your sodium intake to 1,500 mg a day.  When on the DASH eating plan, aim to eat more fresh fruits and vegetables, whole grains, lean proteins, low-fat dairy, and heart-healthy fats.  Work with your health care provider or diet and nutrition specialist (dietitian) to adjust your eating plan to your individual calorie needs. This information is not intended to replace advice given to you by your health care provider. Make sure you discuss any questions you have with your health care provider. Document Released: 10/21/2011 Document Revised: 10/25/2016 Document Reviewed: 10/25/2016 Elsevier Interactive Patient Education  2018 [employer]    General Headache Without Cause A headache is pain or discomfort felt around the head or neck area. There are many causes and types of headaches. In some cases, the cause may not be found. Follow these instructions at home: Managing pain  Take over-the-counter and prescription medicines only as told by your doctor.  Lie down in a dark, quiet room when you have a headache.  If directed, apply ice to the head and neck area: ? Put ice in a plastic bag. ? Place a towel between your skin and the bag. ? Leave the ice on for 20 minutes, 2-3 times per day.  Use a heating pad or hot shower to apply heat to the head and neck area as told by your doctor.  Keep lights dim if bright lights bother you or make your headaches worse. Eating and drinking  Eat meals  on a regular schedule.  Lessen how much alcohol you drink.  Lessen how much caffeine you drink, or stop drinking caffeine. General instructions  Keep all follow-up visits as told by your doctor. This is important.  Keep a journal to find out if certain things bring on headaches. For example, write down: ? What you eat and drink. ? How much sleep you get. ? Any change to your diet or medicines.  Relax by getting a massage or doing other relaxing activities.  Lessen stress.  Sit up straight. Do not tighten (tense) your muscles.  Do not use tobacco products. This includes cigarettes, chewing tobacco, or e-cigarettes. If you need help quitting, ask your doctor.  Exercise regularly as told by your doctor.  Get enough sleep. This often means 7-9 hours of sleep. Contact a doctor if:  Your symptoms are not helped by medicine.  You have a headache that feels different than the other headaches.  You feel sick to your stomach (nauseous) or you throw up (vomit).  You have a fever. Get help right away if:  Your headache becomes really bad.  You keep throwing up.  You have a stiff neck.  You have trouble seeing.  You have trouble speaking.  You have pain in the eye or ear.  Your muscles are weak or you lose muscle control.  You lose your balance or have trouble walking.  You feel like you will pass out (faint) or you pass out.  You have confusion. This information is not intended to replace advice given to you by your health care provider. Make sure  you discuss any questions you have with your health care provider. Document Released: 08/10/2008 Document Revised: 04/08/2016 Document Reviewed: 02/24/2015 Elsevier Interactive Patient Education  2018 [employer].   Fall Prevention in the Home Falls can cause injuries. They can happen to people of all ages. There are many things you can do to make your home safe and to help prevent falls. What can I do on the outside of  my home?  Regularly fix the edges of walkways and driveways and fix any cracks.  Remove anything that might make you trip as you walk through a door, such as a raised step or threshold.  Trim any bushes or trees on the path to your home.  Use bright outdoor lighting.  Clear any walking paths of anything that might make someone trip, such as rocks or tools.  Regularly check to see if handrails are loose or broken. Make sure that both sides of any steps have handrails.  Any raised decks and porches should have guardrails on the edges.  Have any leaves, snow, or ice cleared regularly.  Use sand or salt on walking paths during winter.  Clean up any spills in your garage right away. This includes oil or grease spills. What can I do in the bathroom?  Use night lights.  Install grab bars by the toilet and in the tub and shower. Do not use towel bars as grab bars.  Use non-skid mats or decals in the tub or shower.  If you need to sit down in the shower, use a plastic, non-slip stool.  Keep the floor dry. Clean up any water that spills on the floor as soon as it happens.  Remove soap buildup in the tub or shower regularly.  Attach bath mats securely with double-sided non-slip rug tape.  Do not have throw rugs and other things on the floor that can make you trip. What can I do in the bedroom?  Use night lights.  Make sure that you have a light by your bed that is easy to reach.  Do not use any sheets or blankets that are too big for your bed. They should not hang down onto the floor.  Have a firm chair that has side arms. You can use this for support while you get dressed.  Do not have throw rugs and other things on the floor that can make you trip. What can I do in the kitchen?  Clean up any spills right away.  Avoid walking on wet floors.  Keep items that you use a lot in easy-to-reach places.  If you need to reach something above you, use a strong step stool that has  a grab bar.  Keep electrical cords out of the way.  Do not use floor polish or wax that makes floors slippery. If you must use wax, use non-skid floor wax.  Do not have throw rugs and other things on the floor that can make you trip. What can I do with my stairs?  Do not leave any items on the stairs.  Make sure that there are handrails on both sides of the stairs and use them. Fix handrails that are broken or loose. Make sure that handrails are as long as the stairways.  Check any carpeting to make sure that it is firmly attached to the stairs. Fix any carpet that is loose or worn.  Avoid having throw rugs at the top or bottom of the stairs. If you do have throw rugs, attach  them to the floor with carpet tape.  Make sure that you have a light switch at the top of the stairs and the bottom of the stairs. If you do not have them, ask someone to add them for you. What else can I do to help prevent falls?  Wear shoes that: ? Do not have high heels. ? Have rubber bottoms. ? Are comfortable and fit you well. ? Are closed at the toe. Do not wear sandals.  If you use a stepladder: ? Make sure that it is fully opened. Do not climb a closed stepladder. ? Make sure that both sides of the stepladder are locked into place. ? Ask someone to hold it for you, if possible.  Clearly mark and make sure that you can see: ? Any grab bars or handrails. ? First and last steps. ? Where the edge of each step is.  Use tools that help you move around (mobility aids) if they are needed. These include: ? Canes. ? Walkers. ? Scooters. ? Crutches.  Turn on the lights when you go into a dark area. Replace any light bulbs as soon as they burn out.  Set up your furniture so you have a clear path. Avoid moving your furniture around.  If any of your floors are uneven, fix them.  If there are any pets around you, be aware of where they are.  Review your medicines with your doctor. Some medicines can  make you feel dizzy. This can increase your chance of falling. Ask your doctor what other things that you can do to help prevent falls. This information is not intended to replace advice given to you by your health care provider. Make sure you discuss any questions you have with your health care provider. Document Released: 08/28/2009 Document Revised: 04/08/2016 Document Reviewed: 12/06/2014 Elsevier Interactive Patient Education  [employer].

## 2018-06-28 NOTE — Evaluation (Signed)
Physical Therapy Evaluation Patient Details Name: [name] [name] MRN: [id number] DOB: [birth year] Today's Date: 06/28/2018   History of Present Illness  [name] [name] is a 84 y.o. female with medical history significant of anxiety, rheumatoid arthritis, COPD, hypertension who is coming to the emergency department after having a fall at home and injuring her chest wall.  She also started having a frontal headache about 2000 which has persisted.  She denies double or blurred vision.  Denies focal numbness or weakness.  However, she has felt nauseous and lightheaded.  She denies fever, chills, sore throat, dyspnea, wheezing, hemoptysis, chest pain, palpitations, dizziness, diaphoresis, PND, orthopnea, but occasionally gets mild lower extremities edema.  No abdominal pain, diarrhea, constipation, melena or hematochezia.  Denies dysuria, frequency or hematuria.  No polyuria, polydipsia or blurred vision.  No heat or cold intolerance.    Clinical Impression  Patient functioning at baseline for functional mobility and gait, demonstrates occasional veering right/left mostly due to having difficulty pushing RW, patient normally uses 4 wheeled walker and assisted by family members for longer than household distances.  Plan:  Patient discharged from physical therapy to care of nursing for ambulation daily as tolerated for length of stay.     Follow Up Recommendations No PT follow up;Supervision for mobility/OOB    Equipment Recommendations  None recommended by PT    Recommendations for Other Services       Precautions / Restrictions Precautions Precautions: Fall Restrictions Weight Bearing Restrictions: No      Mobility  Bed Mobility Overal bed mobility: Independent                Transfers Overall transfer level: Modified independent Equipment used: Rolling walker (2 wheeled)             General transfer comment: increased time  Ambulation/Gait Ambulation/Gait assistance:  Supervision Gait Distance (Feet): 120 Feet Assistive device: Rolling walker (2 wheeled) Gait Pattern/deviations: Drifts right/left;WFL(Within Functional Limits) Gait velocity: decreased   General Gait Details: grossly WFL except occasional drifting right/left, 1 near loss of balance due to difficulty pushing RW, patient used to rollator walker  Stairs Stairs: Yes Stairs assistance: Supervision Stair Management: Two rails;Alternating pattern Number of Stairs: 4 General stair comments: Patient demonstrates good return for going up/down steps using bilateral siderails without loss of balance  Wheelchair Mobility    Modified Rankin (Stroke Patients Only)       Balance                                             Pertinent Vitals/Pain      Home Living Family/patient expects to be discharged to:: Private residence Living Arrangements: Spouse/significant other;Children Available Help at Discharge: Family Type of Home: House Home Access: Stairs to enter Entrance Stairs-Rails: None Secretary/administrator of Steps: 2 Home Layout: One level Home Equipment: Environmental consultant - 4 wheels      Prior Function Level of Independence: Independent with assistive device(s)         Comments: household and short distanced community ambulator with Rollator     Hand Dominance        Extremity/Trunk Assessment   Upper Extremity Assessment Upper Extremity Assessment: Overall WFL for tasks assessed    Lower Extremity Assessment Lower Extremity Assessment: Overall WFL for tasks assessed    Cervical / Trunk Assessment Cervical / Trunk Assessment: Normal  Communication  Communication: No difficulties  Cognition Arousal/Alertness: Awake/alert Behavior During Therapy: WFL for tasks assessed/performed Overall Cognitive Status: Within Functional Limits for tasks assessed                                        General Comments      Exercises      Assessment/Plan    PT Assessment Patent does not need any further PT services  PT Problem List         PT Treatment Interventions      PT Goals (Current goals can be found in the Care Plan section)  Acute Rehab PT Goals Patient Stated Goal: return home PT Goal Formulation: With patient/family Time For Goal Achievement: 06/28/18 Potential to Achieve Goals: Good    Frequency     Barriers to discharge        Co-evaluation               AM-PAC PT "6 Clicks" Daily Activity  Outcome Measure Difficulty turning over in bed (including adjusting bedclothes, sheets and blankets)?: None Difficulty moving from lying on back to sitting on the side of the bed? : None Difficulty sitting down on and standing up from a chair with arms (e.g., wheelchair, bedside commode, etc,.)?: None Help needed moving to and from a bed to chair (including a wheelchair)?: None Help needed walking in hospital room?: A Little Help needed climbing 3-5 steps with a railing? : A Little 6 Click Score: 22    End of Session   Activity Tolerance: Patient tolerated treatment well Patient left: in chair;with call bell/phone within reach;with family/visitor present Nurse Communication: Mobility status PT Visit Diagnosis: Unsteadiness on feet (R26.81);Other abnormalities of gait and mobility (R26.89);Muscle weakness (generalized) (M62.81)    Time: 8366-2947 PT Time Calculation (min) (ACUTE ONLY): 22 min   Charges:   PT Evaluation $PT Eval Moderate Complexity: 1 Mod PT Treatments $Therapeutic Activity: 23-37 mins        12:16 PM, 06/28/18 Ocie Bob, MPT Physical Therapist with [facility] 336 [phone] office [phone] mobile phone

## 2018-07-05 ENCOUNTER — Encounter: Payer: Self-pay | Admitting: Cardiovascular Disease

## 2018-07-05 ENCOUNTER — Ambulatory Visit: Payer: Medicare HMO | Admitting: Cardiovascular Disease

## 2018-07-05 VITALS — BP 132/72 | HR 69 | Ht 61.0 in | Wt 101.0 lb

## 2018-07-05 DIAGNOSIS — I10 Essential (primary) hypertension: Secondary | ICD-10-CM | POA: Diagnosis not present

## 2018-07-05 DIAGNOSIS — R55 Syncope and collapse: Secondary | ICD-10-CM | POA: Diagnosis not present

## 2018-07-05 DIAGNOSIS — Z9289 Personal history of other medical treatment: Secondary | ICD-10-CM

## 2018-07-05 NOTE — Patient Instructions (Signed)
Medication Instructions:  Your physician recommends that you continue on your current medications as directed. Please refer to the Current Medication list given to you today.   Labwork: NONE  Testing/Procedures: NONE  Follow-Up: Your physician recommends that you schedule a follow-up appointment in: December 2019   Any Other Special Instructions Will Be Listed Below (If Applicable).     If you need a refill on your cardiac medications before your next appointment, please call your pharmacy.   

## 2018-07-05 NOTE — Progress Notes (Signed)
SUBJECTIVE: The patient presents for posthospitalization follow-up.  She was discharged about a week ago for hypertensive urgency.  She was started on metoprolol tartrate 25 mg twice daily and amlodipine 5 mg daily.  She was continued on ramipril 20 mg daily.  CT of the brain did not show any acute abnormalities and showed chronic atrophic and ischemic changes.  Echocardiogram 06/27/2018: Normal left ventricular systolic function and regional wall motion, LVEF 60 to 65%, mild LVH, grade 1 diastolic dysfunction, indeterminate filling pressures.  I stopped HCTZ on 05/05/18 due to concerns for intravascular volume depletion.  Event monitoring demonstrated sinus rhythm and sinus tachycardia with no significant arrhythmias.  The patient denies any symptoms of chest pain, palpitations, shortness of breath, lightheadedness, dizziness, leg swelling, orthopnea, PND, and syncope.  I reviewed her blood pressure log which showed fluctuating systolic blood pressures in the past week ranging from 130-150 range.  She has had some headaches at night and she is on accustomed to having headaches.  She has not checked her blood pressure during these times.   Soc Hx: Her daughter, Arline Asp, is a Engineer, civil (consulting) who used to work on the third floor at [employer].  They live with their son.  They have another daughter who lives in [place].  Her husband is a retired Charity fundraiser who used to own his own Architect in [place].  Review of Systems: As per "subjective", otherwise negative.  Allergies  Allergen Reactions  . Cortisone Other (See Comments)    Irregular Heart Beat  . Morphine And Related Nausea And Vomiting  . Motrin [Ibuprofen]   . Sulfa Antibiotics Hives  . Vicodin [Hydrocodone-Acetaminophen] Nausea And Vomiting  . Penicillins Hives    Has patient had a PCN reaction causing immediate rash, facial/tongue/throat swelling, SOB or lightheadedness with hypotension: Yes Has patient had a PCN  reaction causing severe rash involving mucus membranes or skin necrosis: No Has patient had a PCN reaction that required hospitalization No Has patient had a PCN reaction occurring within the last 10 years: No If all of the above answers are "NO", then may proceed with Cephalosporin use.     Current Outpatient Medications  Medication Sig Dispense Refill  . albuterol (PROVENTIL HFA;VENTOLIN HFA) 108 (90 BASE) MCG/ACT inhaler Inhale 2 puffs into the lungs every 6 (six) hours as needed for wheezing or shortness of breath.    . ALPRAZolam (XANAX) 0.5 MG tablet Take 0.5 mg by mouth 3 (three) times daily.    Marland Kitchen amLODipine (NORVASC) 5 MG tablet Take 1 tablet (5 mg total) by mouth daily. 30 tablet 0  . budesonide-formoterol (SYMBICORT) 160-4.5 MCG/ACT inhaler Inhale 2 puffs into the lungs 2 (two) times daily.    . cholecalciferol (VITAMIN D) 1000 UNITS tablet Take 1,000 Units by mouth daily.    . clotrimazole-betamethasone (LOTRISONE) cream Apply to affected area twice daily    . feeding supplement, ENSURE ENLIVE, (ENSURE ENLIVE) LIQD Take 237 mLs by mouth 2 (two) times daily between meals. 14220 mL 0  . fluticasone (FLONASE) 50 MCG/ACT nasal spray Place 2 sprays into both nostrils daily.    Marland Kitchen leflunomide (ARAVA) 20 MG tablet Take 20 mg by mouth daily.    . metoprolol tartrate (LOPRESSOR) 25 MG tablet Take 1 tablet (25 mg total) by mouth 2 (two) times daily. 60 tablet 0  . Multiple Vitamin (MULTIVITAMIN WITH MINERALS) TABS tablet Take 1 tablet by mouth daily.    . ramipril (ALTACE) 10 MG capsule Take 20  mg by mouth daily.    . traMADol (ULTRAM) 50 MG tablet Take 50 mg by mouth 4 (four) times daily as needed for moderate pain.      No current facility-administered medications for this visit.     Past Medical History:  Diagnosis Date  . Anxiety   . Arthritis    rheumatoid  . COPD (chronic obstructive pulmonary disease) (HCC)   . Hypertension     Past Surgical History:  Procedure Laterality  Date  . APPENDECTOMY    . CATARACT EXTRACTION W/PHACO Right 11/27/2013   Procedure: CATARACT EXTRACTION PHACO AND INTRAOCULAR LENS PLACEMENT (IOC);  Surgeon: Loraine Leriche T. Nile Riggs, MD;  Location: [facility];  Service: Ophthalmology;  Laterality: Right;  CDE:7.31  . CATARACT EXTRACTION W/PHACO Left 12/11/2013   Procedure: CATARACT EXTRACTION PHACO AND INTRAOCULAR LENS PLACEMENT (IOC);  Surgeon: Loraine Leriche T. Nile Riggs, MD;  Location: [facility];  Service: Ophthalmology;  Laterality: Left;  CDE:7.94  . HUMERUS FRACTURE SURGERY Left    had surgery x3 on it.  . REPLACEMENT TOTAL KNEE BILATERAL      Social History   Socioeconomic History  . Marital status: Married    Spouse name: Not on file  . Number of children: Not on file  . Years of education: Not on file  . Highest education level: Not on file  Occupational History  . Not on file  Social Needs  . Financial resource strain: Not hard at all  . Food insecurity:    Worry: Patient refused    Inability: Patient refused  . Transportation needs:    Medical: Patient refused    Non-medical: Patient refused  Tobacco Use  . Smoking status: Former Smoker    Packs/day: 2.00    Years: 30.00    Pack years: 60.00    Types: Cigarettes  . Smokeless tobacco: Former Neurosurgeon    Quit date: 11/22/1997  Substance and Sexual Activity  . Alcohol use: Yes    Comment: 1 mixed drink at night  . Drug use: No  . Sexual activity: Yes    Birth control/protection: Post-menopausal  Lifestyle  . Physical activity:    Days per week: 0 days    Minutes per session: 0 min  . Stress: Very much  Relationships  . Social connections:    Talks on phone: More than three times a week    Gets together: More than three times a week    Attends religious service: Never    Active member of club or organization: No    Attends meetings of clubs or organizations: Never    Relationship status: Married  . Intimate partner violence:    Fear of current or ex partner: No    Emotionally abused: No     Physically abused: No    Forced sexual activity: No  Other Topics Concern  . Not on file  Social History Narrative  . Not on file     Vitals:   07/05/18 1426  BP: 132/72  Pulse: 69  SpO2: 92%  Weight: 101 lb (45.8 kg)  Height: 5\' 1"  (1.549 m)    Wt Readings from Last 3 Encounters:  07/05/18 101 lb (45.8 kg)  06/27/18 100 lb 15.5 oz (45.8 kg)  05/05/18 101 lb (45.8 kg)     PHYSICAL EXAM General: NAD HEENT: Normal. Neck: No JVD, no thyromegaly. Lungs: Clear to auscultation bilaterally with normal respiratory effort. CV: Regular rate and rhythm, normal S1/S2, no S3/S4, no murmur. No pretibial or periankle edema.  No carotid bruit.   Abdomen: Soft, nontender, no distention.  Neurologic: Alert and oriented.  Psych: Normal affect. Skin: Normal. Musculoskeletal: No gross deformities.    ECG: Reviewed above under Subjective   Labs: Lab Results  Component Value Date/Time   K 3.5 06/28/2018 05:10 AM   BUN 15 06/28/2018 05:10 AM   CREATININE 0.84 06/28/2018 05:10 AM   ALT 16 01/11/2017 03:34 PM   HGB 14.2 06/27/2018 02:48 AM     Lipids: No results found for: LDLCALC, LDLDIRECT, CHOL, TRIG, HDL     ASSESSMENT AND PLAN: 1.  Syncope: Unclear etiology at this time. No recurrences.  Event monitoring was normal.  There is no history to suggest ischemic heart disease and there is no family history of heart disease either.  2.  Hypertension: Recently hospitalized for hypertensive urgency with medication changes noted above.  Blood pressure is normal today with fluctuating blood pressures over the past week as reviewed above.  No changes.  I asked her to check her blood pressure when she has headaches to see if it is elevated.    Disposition: Follow up December 2019   Prentice Docker, M.D., F.A.C.C.

## 2018-11-03 ENCOUNTER — Ambulatory Visit: Payer: Medicare HMO | Admitting: Cardiovascular Disease

## 2018-11-03 ENCOUNTER — Encounter: Payer: Self-pay | Admitting: Cardiovascular Disease

## 2018-11-03 VITALS — BP 138/70 | HR 68 | Ht 60.0 in | Wt 104.0 lb

## 2018-11-03 DIAGNOSIS — R55 Syncope and collapse: Secondary | ICD-10-CM | POA: Diagnosis not present

## 2018-11-03 DIAGNOSIS — I10 Essential (primary) hypertension: Secondary | ICD-10-CM

## 2018-11-03 NOTE — Progress Notes (Signed)
SUBJECTIVE: The patient presents for routine follow-up. The patient denies any symptoms of chest pain, palpitations, shortness of breath, lightheadedness, dizziness, leg swelling, orthopnea, PND, and syncope.  She and her husband celebrated their 65th anniversary over Thanksgiving.  Her husband turned 89.  Their children had a stretch limo arrived to their house and take them to a seafood restaurant in [place] where they spent 4 hours.  She has a lot of family coming over for Christmas.    Soc Hx: Her daughter, Arline Asp, is a Engineer, civil (consulting) who used to work on the third floor at [employer].  They live with their son.  They have another daughter who lives in [place].  Her husband is a retired Charity fundraiser who used to own his own Architect in [place].  Review of Systems: As per "subjective", otherwise negative.  Allergies  Allergen Reactions  . Cortisone Other (See Comments)    Irregular Heart Beat  . Morphine And Related Nausea And Vomiting  . Motrin [Ibuprofen]   . Sulfa Antibiotics Hives  . Vicodin [Hydrocodone-Acetaminophen] Nausea And Vomiting  . Penicillins Hives    Has patient had a PCN reaction causing immediate rash, facial/tongue/throat swelling, SOB or lightheadedness with hypotension: Yes Has patient had a PCN reaction causing severe rash involving mucus membranes or skin necrosis: No Has patient had a PCN reaction that required hospitalization No Has patient had a PCN reaction occurring within the last 10 years: No If all of the above answers are "NO", then may proceed with Cephalosporin use.     Current Outpatient Medications  Medication Sig Dispense Refill  . albuterol (PROVENTIL HFA;VENTOLIN HFA) 108 (90 BASE) MCG/ACT inhaler Inhale 2 puffs into the lungs every 6 (six) hours as needed for wheezing or shortness of breath.    . ALPRAZolam (XANAX) 0.5 MG tablet Take 0.5 mg by mouth 3 (three) times daily.    . budesonide-formoterol (SYMBICORT) 160-4.5 MCG/ACT  inhaler Inhale 2 puffs into the lungs 2 (two) times daily.    . cholecalciferol (VITAMIN D) 1000 UNITS tablet Take 1,000 Units by mouth daily.    . clotrimazole-betamethasone (LOTRISONE) cream Apply to affected area twice daily    . fluticasone (FLONASE) 50 MCG/ACT nasal spray Place 2 sprays into both nostrils daily.    Marland Kitchen leflunomide (ARAVA) 20 MG tablet Take 20 mg by mouth daily.    . Multiple Vitamin (MULTIVITAMIN WITH MINERALS) TABS tablet Take 1 tablet by mouth daily.    . ramipril (ALTACE) 10 MG capsule Take 20 mg by mouth daily.    . traMADol (ULTRAM) 50 MG tablet Take 50 mg by mouth 4 (four) times daily as needed for moderate pain.     Marland Kitchen amLODipine (NORVASC) 5 MG tablet Take 1 tablet (5 mg total) by mouth daily. 30 tablet 0  . metoprolol tartrate (LOPRESSOR) 25 MG tablet Take 1 tablet (25 mg total) by mouth 2 (two) times daily. 60 tablet 0   No current facility-administered medications for this visit.     Past Medical History:  Diagnosis Date  . Anxiety   . Arthritis    rheumatoid  . COPD (chronic obstructive pulmonary disease) (HCC)   . Hypertension     Past Surgical History:  Procedure Laterality Date  . APPENDECTOMY    . CATARACT EXTRACTION W/PHACO Right 11/27/2013   Procedure: CATARACT EXTRACTION PHACO AND INTRAOCULAR LENS PLACEMENT (IOC);  Surgeon: Loraine Leriche T. Nile Riggs, MD;  Location: [facility];  Service: Ophthalmology;  Laterality: Right;  CDE:7.31  .  CATARACT EXTRACTION W/PHACO Left 12/11/2013   Procedure: CATARACT EXTRACTION PHACO AND INTRAOCULAR LENS PLACEMENT (IOC);  Surgeon: Loraine Leriche T. Nile Riggs, MD;  Location: [facility];  Service: Ophthalmology;  Laterality: Left;  CDE:7.94  . HUMERUS FRACTURE SURGERY Left    had surgery x3 on it.  . REPLACEMENT TOTAL KNEE BILATERAL      Social History   Socioeconomic History  . Marital status: Married    Spouse name: Not on file  . Number of children: Not on file  . Years of education: Not on file  . Highest education level: Not on file    Occupational History  . Not on file  Social Needs  . Financial resource strain: Not hard at all  . Food insecurity:    Worry: Patient refused    Inability: Patient refused  . Transportation needs:    Medical: Patient refused    Non-medical: Patient refused  Tobacco Use  . Smoking status: Former Smoker    Packs/day: 2.00    Years: 30.00    Pack years: 60.00    Types: Cigarettes  . Smokeless tobacco: Former Neurosurgeon    Quit date: 11/22/1997  Substance and Sexual Activity  . Alcohol use: Yes    Comment: 1 mixed drink at night  . Drug use: No  . Sexual activity: Yes    Birth control/protection: Post-menopausal  Lifestyle  . Physical activity:    Days per week: 0 days    Minutes per session: 0 min  . Stress: Very much  Relationships  . Social connections:    Talks on phone: More than three times a week    Gets together: More than three times a week    Attends religious service: Never    Active member of club or organization: No    Attends meetings of clubs or organizations: Never    Relationship status: Married  . Intimate partner violence:    Fear of current or ex partner: No    Emotionally abused: No    Physically abused: No    Forced sexual activity: No  Other Topics Concern  . Not on file  Social History Narrative  . Not on file     Vitals:   11/03/18 0825  BP: 138/70  Pulse: 68  SpO2: 97%  Weight: 104 lb (47.2 kg)  Height: 5' (1.524 m)    Wt Readings from Last 3 Encounters:  11/03/18 104 lb (47.2 kg)  07/05/18 101 lb (45.8 kg)  06/27/18 100 lb 15.5 oz (45.8 kg)     PHYSICAL EXAM General: NAD HEENT: Normal. Neck: No JVD, no thyromegaly. Lungs: Clear to auscultation bilaterally with normal respiratory effort. CV: Regular rate and rhythm, normal S1/S2, no S3/S4, no murmur. No pretibial or periankle edema.  No carotid bruit.   Abdomen: Soft, nontender, no distention.  Neurologic: Alert and oriented.  Psych: Normal affect. Skin:  Normal. Musculoskeletal: No gross deformities.    ECG: Reviewed above under Subjective   Labs: Lab Results  Component Value Date/Time   K 3.5 06/28/2018 05:10 AM   BUN 15 06/28/2018 05:10 AM   CREATININE 0.84 06/28/2018 05:10 AM   ALT 16 01/11/2017 03:34 PM   HGB 14.2 06/27/2018 02:48 AM     Lipids: No results found for: LDLCALC, LDLDIRECT, CHOL, TRIG, HDL     ASSESSMENT AND PLAN: 1. Syncope: Unclear etiology at this time. No recurrences.  Event monitoring was normal. There is no history to suggest ischemic heart disease and there is no family  history of heart disease either.  2. Hypertension: Previously hospitalized for hypertensive urgency.  Blood pressure is normal today. No changes.     Disposition: Follow up 6 months   Prentice Docker, M.D., F.A.C.C.

## 2018-11-03 NOTE — Patient Instructions (Signed)
Medication Instructions:  Your physician recommends that you continue on your current medications as directed. Please refer to the Current Medication list given to you today.  If you need a refill on your cardiac medications before your next appointment, please call your pharmacy.   Lab work: None today If you have labs (blood work) drawn today and your tests are completely normal, you will receive your results only by: . MyChart Message (if you have MyChart) OR . A paper copy in the mail If you have any lab test that is abnormal or we need to change your treatment, we will call you to review the results.  Testing/Procedures: None today  Follow-Up: At [facility], you and your health needs are our priority.  As part of our continuing mission to provide you with exceptional heart care, we have created designated Provider Care Teams.  These Care Teams include your primary Cardiologist (physician) and Advanced Practice Providers (APPs -  Physician Assistants and Nurse Practitioners) who all work together to provide you with the care you need, when you need it. You will need a follow up appointment in 6 months.  Please call our office 2 months in advance to schedule this appointment.  You may see Suresh Koneswaran, MD or one of the following Advanced Practice Providers on your designated Care Team:   Brittany Strader, PA-C ([place] Office) . Michele Lenze, PA-C ([place] Office)  Any Other Special Instructions Will Be Listed Below (If Applicable). None   

## 2018-12-31 ENCOUNTER — Inpatient Hospital Stay (HOSPITAL_COMMUNITY)
Admission: EM | Admit: 2018-12-31 | Discharge: 2019-01-08 | DRG: 470 | Disposition: A | Discharge status: Home Health | Payer: Medicare HMO | Attending: Student | Admitting: Student

## 2018-12-31 ENCOUNTER — Emergency Department (HOSPITAL_COMMUNITY): Payer: Medicare HMO

## 2018-12-31 ENCOUNTER — Other Ambulatory Visit: Payer: Self-pay

## 2018-12-31 ENCOUNTER — Encounter (HOSPITAL_COMMUNITY): Payer: Self-pay | Admitting: Emergency Medicine

## 2018-12-31 DIAGNOSIS — W010XXA Fall on same level from slipping, tripping and stumbling without subsequent striking against object, initial encounter: Secondary | ICD-10-CM | POA: Diagnosis not present

## 2018-12-31 DIAGNOSIS — Z885 Allergy status to narcotic agent status: Secondary | ICD-10-CM | POA: Diagnosis not present

## 2018-12-31 DIAGNOSIS — E440 Moderate protein-calorie malnutrition: Secondary | ICD-10-CM | POA: Diagnosis present

## 2018-12-31 DIAGNOSIS — Z96653 Presence of artificial knee joint, bilateral: Secondary | ICD-10-CM | POA: Diagnosis present

## 2018-12-31 DIAGNOSIS — J9811 Atelectasis: Secondary | ICD-10-CM | POA: Diagnosis not present

## 2018-12-31 DIAGNOSIS — Z8249 Family history of ischemic heart disease and other diseases of the circulatory system: Secondary | ICD-10-CM

## 2018-12-31 DIAGNOSIS — Z6820 Body mass index (BMI) 20.0-20.9, adult: Secondary | ICD-10-CM

## 2018-12-31 DIAGNOSIS — J449 Chronic obstructive pulmonary disease, unspecified: Secondary | ICD-10-CM | POA: Diagnosis present

## 2018-12-31 DIAGNOSIS — Z96649 Presence of unspecified artificial hip joint: Secondary | ICD-10-CM

## 2018-12-31 DIAGNOSIS — Z8739 Personal history of other diseases of the musculoskeletal system and connective tissue: Secondary | ICD-10-CM | POA: Diagnosis not present

## 2018-12-31 DIAGNOSIS — Z888 Allergy status to other drugs, medicaments and biological substances status: Secondary | ICD-10-CM

## 2018-12-31 DIAGNOSIS — Z9842 Cataract extraction status, left eye: Secondary | ICD-10-CM

## 2018-12-31 DIAGNOSIS — D696 Thrombocytopenia, unspecified: Secondary | ICD-10-CM | POA: Diagnosis not present

## 2018-12-31 DIAGNOSIS — Z87891 Personal history of nicotine dependence: Secondary | ICD-10-CM | POA: Diagnosis not present

## 2018-12-31 DIAGNOSIS — R350 Frequency of micturition: Secondary | ICD-10-CM | POA: Diagnosis present

## 2018-12-31 DIAGNOSIS — G4734 Idiopathic sleep related nonobstructive alveolar hypoventilation: Secondary | ICD-10-CM | POA: Diagnosis not present

## 2018-12-31 DIAGNOSIS — S72001A Fracture of unspecified part of neck of right femur, initial encounter for closed fracture: Secondary | ICD-10-CM | POA: Diagnosis present

## 2018-12-31 DIAGNOSIS — I455 Other specified heart block: Secondary | ICD-10-CM | POA: Diagnosis not present

## 2018-12-31 DIAGNOSIS — F419 Anxiety disorder, unspecified: Secondary | ICD-10-CM | POA: Diagnosis present

## 2018-12-31 DIAGNOSIS — K5900 Constipation, unspecified: Secondary | ICD-10-CM | POA: Diagnosis present

## 2018-12-31 DIAGNOSIS — Z880 Allergy status to penicillin: Secondary | ICD-10-CM | POA: Diagnosis not present

## 2018-12-31 DIAGNOSIS — I10 Essential (primary) hypertension: Secondary | ICD-10-CM | POA: Diagnosis present

## 2018-12-31 DIAGNOSIS — E876 Hypokalemia: Secondary | ICD-10-CM | POA: Diagnosis present

## 2018-12-31 DIAGNOSIS — Z9841 Cataract extraction status, right eye: Secondary | ICD-10-CM

## 2018-12-31 DIAGNOSIS — D62 Acute posthemorrhagic anemia: Secondary | ICD-10-CM | POA: Diagnosis not present

## 2018-12-31 DIAGNOSIS — R0902 Hypoxemia: Secondary | ICD-10-CM | POA: Diagnosis present

## 2018-12-31 DIAGNOSIS — R410 Disorientation, unspecified: Secondary | ICD-10-CM | POA: Diagnosis not present

## 2018-12-31 DIAGNOSIS — M069 Rheumatoid arthritis, unspecified: Secondary | ICD-10-CM | POA: Diagnosis present

## 2018-12-31 DIAGNOSIS — I119 Hypertensive heart disease without heart failure: Secondary | ICD-10-CM | POA: Diagnosis not present

## 2018-12-31 DIAGNOSIS — Z79899 Other long term (current) drug therapy: Secondary | ICD-10-CM

## 2018-12-31 DIAGNOSIS — D631 Anemia in chronic kidney disease: Secondary | ICD-10-CM | POA: Diagnosis present

## 2018-12-31 DIAGNOSIS — M62838 Other muscle spasm: Secondary | ICD-10-CM | POA: Diagnosis present

## 2018-12-31 DIAGNOSIS — R5082 Postprocedural fever: Secondary | ICD-10-CM | POA: Diagnosis not present

## 2018-12-31 DIAGNOSIS — Y92009 Unspecified place in unspecified non-institutional (private) residence as the place of occurrence of the external cause: Secondary | ICD-10-CM | POA: Diagnosis not present

## 2018-12-31 DIAGNOSIS — Z882 Allergy status to sulfonamides status: Secondary | ICD-10-CM | POA: Diagnosis not present

## 2018-12-31 DIAGNOSIS — Z9981 Dependence on supplemental oxygen: Secondary | ICD-10-CM

## 2018-12-31 DIAGNOSIS — Z961 Presence of intraocular lens: Secondary | ICD-10-CM | POA: Diagnosis present

## 2018-12-31 LAB — BASIC METABOLIC PANEL
Anion gap: 9 (ref 5–15)
BUN: 15 mg/dL (ref 8–23)
CHLORIDE: 100 mmol/L (ref 98–111)
CO2: 27 mmol/L (ref 22–32)
Calcium: 9.1 mg/dL (ref 8.9–10.3)
Creatinine, Ser: 1 mg/dL (ref 0.44–1.00)
GFR calc Af Amer: 60 mL/min — ABNORMAL LOW (ref >60)
GFR, EST NON AFRICAN AMERICAN: 52 mL/min — AB (ref >60)
Glucose, Bld: 111 mg/dL — ABNORMAL HIGH (ref 70–99)
POTASSIUM: 4.1 mmol/L (ref 3.5–5.1)
SODIUM: 136 mmol/L (ref 135–145)

## 2018-12-31 LAB — TYPE AND SCREEN
ABO/RH(D): O NEG
ANTIBODY SCREEN: NEGATIVE

## 2018-12-31 LAB — CBC WITH DIFFERENTIAL/PLATELET
ABS IMMATURE GRANULOCYTES: 0.02 10*3/uL (ref 0.00–0.07)
BASOS ABS: 0 10*3/uL (ref 0.0–0.1)
BASOS PCT: 1 %
Eosinophils Absolute: 0.3 10*3/uL (ref 0.0–0.5)
Eosinophils Relative: 5 %
HCT: 39.3 % (ref 36.0–46.0)
HEMOGLOBIN: 12.3 g/dL (ref 12.0–15.0)
Immature Granulocytes: 0 %
LYMPHS PCT: 36 %
Lymphs Abs: 2 10*3/uL (ref 0.7–4.0)
MCH: 29 pg (ref 26.0–34.0)
MCHC: 31.3 g/dL (ref 30.0–36.0)
MCV: 92.7 fL (ref 80.0–100.0)
MONO ABS: 0.8 10*3/uL (ref 0.1–1.0)
Monocytes Relative: 14 %
NEUTROS ABS: 2.4 10*3/uL (ref 1.7–7.7)
NRBC: 0 % (ref 0.0–0.2)
Neutrophils Relative %: 44 %
PLATELETS: 198 10*3/uL (ref 150–400)
RBC: 4.24 MIL/uL (ref 3.87–5.11)
RDW: 14.1 % (ref 11.5–15.5)
WBC: 5.5 10*3/uL (ref 4.0–10.5)

## 2018-12-31 LAB — PROTIME-INR
INR: 0.89
PROTHROMBIN TIME: 12 s (ref 11.4–15.2)

## 2018-12-31 MED ORDER — HYDRALAZINE HCL 20 MG/ML IJ SOLN: 10.0000 mg | INTRAMUSCULAR | Status: DC | PRN

## 2018-12-31 MED ORDER — AMLODIPINE BESYLATE 5 MG PO TABS
5.0000 mg | ORAL_TABLET | Freq: Every day | ORAL | Status: DC
  Administered 2019-01-01: 5 mg via ORAL
  Filled 2018-12-31: qty 1

## 2018-12-31 MED ORDER — SODIUM CHLORIDE 0.9 % IV SOLN
INTRAVENOUS | Status: AC
  Administered 2018-12-31 – 2019-01-01 (×2): via INTRAVENOUS

## 2018-12-31 MED ORDER — BISACODYL 5 MG PO TBEC: 5.0000 mg | DELAYED_RELEASE_TABLET | Freq: Every day | ORAL | Status: DC | PRN

## 2018-12-31 MED ORDER — ONDANSETRON HCL 4 MG/2ML IJ SOLN
4.0000 mg | Freq: Once | INTRAMUSCULAR | Status: DC
  Filled 2018-12-31 (×2): qty 2

## 2018-12-31 MED ORDER — FENTANYL CITRATE (PF) 100 MCG/2ML IJ SOLN
25.0000 ug | INTRAMUSCULAR | Status: DC | PRN
  Administered 2019-01-01 (×5): 50 ug via INTRAVENOUS
  Filled 2018-12-31 (×6): qty 2

## 2018-12-31 MED ORDER — ONDANSETRON HCL 4 MG/2ML IJ SOLN
4.0000 mg | Freq: Four times a day (QID) | INTRAMUSCULAR | Status: DC | PRN
  Administered 2019-01-01: 4 mg via INTRAVENOUS

## 2018-12-31 MED ORDER — METOPROLOL TARTRATE 25 MG PO TABS
25.0000 mg | ORAL_TABLET | Freq: Two times a day (BID) | ORAL | Status: DC
  Administered 2019-01-01 – 2019-01-08 (×15): 25 mg via ORAL
  Filled 2018-12-31 (×17): qty 1

## 2018-12-31 MED ORDER — ALPRAZOLAM 0.5 MG PO TABS
0.5000 mg | ORAL_TABLET | Freq: Three times a day (TID) | ORAL | Status: DC
  Administered 2019-01-01 – 2019-01-06 (×12): 0.5 mg via ORAL
  Filled 2018-12-31 (×13): qty 1

## 2018-12-31 MED ORDER — MOMETASONE FURO-FORMOTEROL FUM 200-5 MCG/ACT IN AERO
2.0000 | INHALATION_SPRAY | Freq: Two times a day (BID) | RESPIRATORY_TRACT | Status: DC
  Administered 2019-01-01 – 2019-01-08 (×15): 2 via RESPIRATORY_TRACT
  Filled 2018-12-31 (×2): qty 8.8

## 2018-12-31 MED ORDER — SODIUM CHLORIDE 0.9 % IV SOLN
INTRAVENOUS | Status: DC
  Administered 2018-12-31: 20:00:00 via INTRAVENOUS

## 2018-12-31 MED ORDER — FENTANYL CITRATE (PF) 100 MCG/2ML IJ SOLN
50.0000 ug | INTRAMUSCULAR | Status: AC | PRN
  Administered 2018-12-31 (×2): 50 ug via INTRAVENOUS
  Filled 2018-12-31 (×3): qty 2

## 2018-12-31 MED ORDER — SENNOSIDES-DOCUSATE SODIUM 8.6-50 MG PO TABS
1.0000 | ORAL_TABLET | Freq: Every evening | ORAL | Status: DC | PRN
  Administered 2019-01-01: 1 via ORAL
  Filled 2018-12-31 (×2): qty 1

## 2018-12-31 MED ORDER — ALBUTEROL SULFATE (2.5 MG/3ML) 0.083% IN NEBU: 3.0000 mL | INHALATION_SOLUTION | Freq: Four times a day (QID) | RESPIRATORY_TRACT | Status: DC | PRN

## 2018-12-31 NOTE — ED Provider Notes (Signed)
[facility] EMERGENCY DEPARTMENT Provider Note   CSN: [id number] Arrival date & time: 12/31/18  1930     History   Chief Complaint Chief Complaint  Patient presents with  . Fall    HPI [name] [name] is a 85 y.o. female.  HPI  85 y/o female - hx of RA, hx of COPD, and htn - presents after having a mechanical fall at home when she was doing the dishes - she turned around quickly to look across the room, losing her balance and falling to the ground where she hit her R hip and had immediate and acute onset of pain, with deformity and inability to get off the ground - EMS was called who found the pt to be holding the leg in flexion and external rotation - shortened.  Pain is worse with morvement - hx of bilateral knee replacements [place] - denies having a Haematologist.  Past Medical History:  Diagnosis Date  . Anxiety   . Arthritis    rheumatoid  . COPD (chronic obstructive pulmonary disease) (HCC)   . Hypertension     Patient Active Problem List   Diagnosis Date Noted  . Closed right hip fracture, initial encounter (HCC) 12/31/2018  . Essential hypertension 06/27/2018  . COPD (chronic obstructive pulmonary disease) (HCC) 06/27/2018  . Anxiety 06/27/2018  . Headache 06/27/2018    Past Surgical History:  Procedure Laterality Date  . APPENDECTOMY    . CATARACT EXTRACTION W/PHACO Right 11/27/2013   Procedure: CATARACT EXTRACTION PHACO AND INTRAOCULAR LENS PLACEMENT (IOC);  Surgeon: Loraine Leriche T. Nile Riggs, MD;  Location: [facility];  Service: Ophthalmology;  Laterality: Right;  CDE:7.31  . CATARACT EXTRACTION W/PHACO Left 12/11/2013   Procedure: CATARACT EXTRACTION PHACO AND INTRAOCULAR LENS PLACEMENT (IOC);  Surgeon: Loraine Leriche T. Nile Riggs, MD;  Location: [facility];  Service: Ophthalmology;  Laterality: Left;  CDE:7.94  . HUMERUS FRACTURE SURGERY Left    had surgery x3 on it.  . REPLACEMENT TOTAL KNEE BILATERAL       OB History   No obstetric history  on file.      Home Medications    Prior to Admission medications   Medication Sig Start Date End Date Taking? Authorizing Provider  albuterol (PROVENTIL HFA;VENTOLIN HFA) 108 (90 BASE) MCG/ACT inhaler Inhale 2 puffs into the lungs every 6 (six) hours as needed for wheezing or shortness of breath.    [provider]  ALPRAZolam Prudy Feeler) 0.5 MG tablet Take 0.5 mg by mouth 3 (three) times daily.    [provider]  amLODipine (NORVASC) 5 MG tablet Take 1 tablet (5 mg total) by mouth daily. 06/29/18 07/29/18  Johnson, Clanford L, MD  budesonide-formoterol (SYMBICORT) 160-4.5 MCG/ACT inhaler Inhale 2 puffs into the lungs 2 (two) times daily. 01/30/18   [provider]  cholecalciferol (VITAMIN D) 1000 UNITS tablet Take 1,000 Units by mouth daily.    [provider]  clotrimazole-betamethasone (LOTRISONE) cream Apply to affected area twice daily 12/27/16   [provider]  fluticasone (FLONASE) 50 MCG/ACT nasal spray Place 2 sprays into both nostrils daily. 10/12/17   [provider]  leflunomide (ARAVA) 20 MG tablet Take 20 mg by mouth daily.    [provider]  metoprolol tartrate (LOPRESSOR) 25 MG tablet Take 1 tablet (25 mg total) by mouth 2 (two) times daily. 06/28/18 07/28/18  Cleora Fleet, MD  Multiple Vitamin (MULTIVITAMIN WITH MINERALS) TABS tablet Take 1 tablet by mouth daily.  [provider]  ramipril (ALTACE) 10 MG capsule Take 20 mg by mouth daily.    [provider]  traMADol (ULTRAM) 50 MG tablet Take 50 mg by mouth 4 (four) times daily as needed for moderate pain.     [provider]    Family History History reviewed. No pertinent family history.  Social History Social History   Tobacco Use  . Smoking status: Former Smoker    Packs/day: 2.00    Years: 30.00    Pack years: 60.00    Types: Cigarettes  . Smokeless tobacco: Former Neurosurgeon    Quit date: 11/22/1997  Substance Use Topics    . Alcohol use: Yes    Comment: 1 mixed drink at night  . Drug use: No     Allergies   Cortisone; Morphine and related; Motrin [ibuprofen]; Sulfa antibiotics; Vicodin [hydrocodone-acetaminophen]; and Penicillins   Review of Systems Review of Systems  All other systems reviewed and are negative.    Physical Exam Updated Vital Signs BP (!) 174/73   Pulse 79   Temp 99 F (37.2 C) (Oral)   Resp (!) 21   SpO2 96%   Physical Exam Vitals signs and nursing note reviewed.  Constitutional:      General: She is not in acute distress.    Appearance: She is well-developed.  HENT:     Head: Normocephalic and atraumatic.     Mouth/Throat:     Pharynx: No oropharyngeal exudate.  Eyes:     General: No scleral icterus.       Right eye: No discharge.        Left eye: No discharge.     Conjunctiva/sclera: Conjunctivae normal.     Pupils: Pupils are equal, round, and reactive to light.  Neck:     Musculoskeletal: Normal range of motion and neck supple.     Thyroid: No thyromegaly.     Vascular: No JVD.  Cardiovascular:     Rate and Rhythm: Normal rate and regular rhythm.     Heart sounds: Normal heart sounds. No murmur. No friction rub. No gallop.      Comments: Normal pulses at the feet Pulmonary:     Effort: Pulmonary effort is normal. No respiratory distress.     Breath sounds: Normal breath sounds. No wheezing or rales.  Abdominal:     General: Bowel sounds are normal. There is no distension.     Palpations: Abdomen is soft. There is no mass.     Tenderness: There is no abdominal tenderness.  Musculoskeletal:        General: Tenderness and deformity present.     Comments: Has RLE opain with movement of the hip and is flexed, externally rotated and shortened - there is no pain in the knees and ankles - and normal ROM of the bil UE's.  Lymphadenopathy:     Cervical: No cervical adenopathy.  Skin:    General: Skin is warm and dry.     Findings: No erythema or rash.   Neurological:     Mental Status: She is alert.     Coordination: Coordination normal.     Comments: Normal sensation in the legs bilaterally  Psychiatric:        Behavior: Behavior normal.      ED Treatments / Results  Labs (all labs ordered are listed, but only abnormal results are displayed) Labs Reviewed  BASIC METABOLIC PANEL - Abnormal; Notable for the following components:  Result Value   Glucose, Bld 111 (*)    GFR calc non Af Amer 52 (*)    GFR calc Af Amer 60 (*)    All other components within normal limits  CBC WITH DIFFERENTIAL/PLATELET  PROTIME-INR  TYPE AND SCREEN    EKG EKG Interpretation  Date/Time:  Sunday December 31 2018 20:24:29 EST Ventricular Rate:  75 PR Interval:    QRS Duration: 69 QT Interval:  363 QTC Calculation: 406 R Axis:   68 Text Interpretation:  Sinus arrhythmia Prolonged PR interval Since last tracing rate slower Confirmed by Eber HongMiller, Sheina Mcleish ([id number]) on 12/31/2018 8:32:24 PM   Radiology Dg Chest 1 View  Result Date: 12/31/2018 CLINICAL DATA:  85 year old female status post fall with proximal right femur fracture. EXAM: CHEST  1 VIEW COMPARISON:  Chest radiographs 06/27/2018 and earlier. FINDINGS: AP supine view at 2029 hours. Stable tortuosity of the thoracic aorta with calcified plaque. Other mediastinal contours are within normal limits. Visualized tracheal air column is within normal limits. Allowing for portable technique the lungs are clear. Osteopenia. Negative visible bowel gas patter. IMPRESSION: No acute cardiopulmonary abnormality. Electronically Signed   By: Odessa FlemingH  Hall M.D.   On: 12/31/2018 21:17   Dg Hip Unilat With Pelvis 2-3 Views Right  Result Date: 12/31/2018 CLINICAL DATA:  85 year old female status post fall with right hip pain. EXAM: DG HIP (WITH OR WITHOUT PELVIS) 2-3V RIGHT COMPARISON:  Right hip series 04/14/2018. FINDINGS: Proximal right femur fracture through the right femoral neck with varus impaction, mild  anterior displacement also. The right femoral head remains normally located. The proximal right femur intertrochanteric segment appears intact. Chronic right inferior pubic ramus fracture. The pelvis appears stable and intact. Grossly intact proximal left femur. Calcified femoral artery atherosclerosis. Negative visible bowel gas pattern. IMPRESSION: Acute right femoral neck fracture with anterior displacement and varus impaction. Electronically Signed   By: Odessa FlemingH  Hall M.D.   On: 12/31/2018 21:18    Procedures Procedures (including critical care time)  Medications Ordered in ED Medications  ondansetron (ZOFRAN) injection 4 mg (has no administration in time range)  fentaNYL (SUBLIMAZE) injection 25-50 mcg (has no administration in time range)  ondansetron (ZOFRAN) injection 4 mg (has no administration in time range)  hydrALAZINE (APRESOLINE) injection 10 mg (has no administration in time range)  0.9 %  sodium chloride infusion (has no administration in time range)  fentaNYL (SUBLIMAZE) injection 50 mcg (50 mcg Intravenous Given 12/31/18 2118)     Initial Impression / Assessment and Plan / ED Course  I have reviewed the triage vital signs and the nursing notes.  Pertinent labs & imaging results that were available during my care of the patient were reviewed by me and considered in my medical decision making (see chart for details).     Patient has a likely fracture of the hip on the right side.  Will get imaging, chest x-ray preop, EKG and labs.  Pain control, the patient has no other significant injuries, this was a mechanical fall, not syncope.  There is no orthopedic coverage at this hospital today, likely transfer  Discussed with Dr. Antionette Charpyd who will admit.  The patient does have a right femoral neck fracture on my evaluation of the x-ray.  Chest x-ray looks okay, blood work is normal, the patient has hypertension  Discussed with Dr. Charlann Boxerlin who [facility].  N.p.o. after midnight  Final Clinical Impressions(s) / ED Diagnoses   Final diagnoses:  Closed fracture of right hip, initial encounter [facility])  Essential hypertension    ED Discharge Orders    None       Eber Hong, MD 12/31/18 2205

## 2018-12-31 NOTE — H&P (Signed)
History and Physical    [name] Europehyllis R [place]gington ZOX:[id number] DOB: January 06, 1932 DOA: 12/31/2018  PCP: Richmond CampbellKaplan, Kristen W., PA-C   Patient coming from: Home   Chief Complaint: Fall with right hip pain   HPI: [name] [name] is a 85 y.o. female with medical history significant for arthritis status post bilateral knee replacement, anxiety, COPD, and hypertension, now presenting to the emergency department with severe right hip pain and deformity after a ground-level mechanical fall at home.  Patient reports that she been in her usual state of health, was having an uneventful day, and was trying to watch TV while she was doing that dishes, tripped over an open dishwasher, and landed onto her right side, experiencing immediate and severe pain at the right hip.  She has been unable to bear weight.  She denies any chest pain or dyspnea with her usual activities that include doing laundry, dishes, and cleaning around her house.  Family reports that she is quite active and never seems to be short of breath and never complains of any chest pain with her activities.  She was seen by cardiology 2 months ago after syncopal episode, had uneventful event monitoring, and there was no evidence for ischemic heart disease.  Reports a mild chronic cough that is unchanged, uses supplemental oxygen at night only, and has not had any recent fevers or chills.   ED Course: Upon arrival to the ED, patient is found to be afebrile, saturating well on room air, and with blood pressure 180/80.  EKG features a sinus rhythm with first-degree AV nodal block and left atrial enlargement.  Chest x-ray is negative for acute cardiopulmonary disease.  Radiographs of the right hip reveals acute right femoral neck fracture with anterior displacement and varus impaction.  Chemistry panel is notable for creatinine 1.00 and CBC is unremarkable.  Patient was treated with Zofran, fentanyl, IV fluids, and type and screen was performed in the ED.  Orthopedic  surgery was consulted by the ED physician and the patient will be admitted for further evaluation and management of right hip fracture.  Review of Systems:  All other systems reviewed and apart from HPI, are negative.  Past Medical History:  Diagnosis Date  . Anxiety   . Arthritis    rheumatoid  . COPD (chronic obstructive pulmonary disease) (HCC)   . Hypertension     Past Surgical History:  Procedure Laterality Date  . APPENDECTOMY    . CATARACT EXTRACTION W/PHACO Right 11/27/2013   Procedure: CATARACT EXTRACTION PHACO AND INTRAOCULAR LENS PLACEMENT (IOC);  Surgeon: Loraine LericheMark T. Nile RiggsShapiro, MD;  Location: [facility];  Service: Ophthalmology;  Laterality: Right;  CDE:7.31  . CATARACT EXTRACTION W/PHACO Left 12/11/2013   Procedure: CATARACT EXTRACTION PHACO AND INTRAOCULAR LENS PLACEMENT (IOC);  Surgeon: Loraine LericheMark T. Nile RiggsShapiro, MD;  Location: [facility];  Service: Ophthalmology;  Laterality: Left;  CDE:7.94  . HUMERUS FRACTURE SURGERY Left    had surgery x3 on it.  . REPLACEMENT TOTAL KNEE BILATERAL       reports that she has quit smoking. Her smoking use included cigarettes. She has a 60.00 pack-year smoking history. She quit smokeless tobacco use about 21 years ago. She reports current alcohol use. She reports that she does not use drugs.  Allergies  Allergen Reactions  . Cortisone Other (See Comments)    Irregular Heart Beat  . Morphine And Related Nausea And Vomiting  . Motrin [Ibuprofen]   . Sulfa Antibiotics Hives  . Vicodin [Hydrocodone-Acetaminophen] Nausea And Vomiting  .  Penicillins Hives    Has patient had a PCN reaction causing immediate rash, facial/tongue/throat swelling, SOB or lightheadedness with hypotension: Yes Has patient had a PCN reaction causing severe rash involving mucus membranes or skin necrosis: No Has patient had a PCN reaction that required hospitalization No Has patient had a PCN reaction occurring within the last 10 years: No If all of the above answers are "NO", then  may proceed with Cephalosporin use.     Family History  Problem Relation Age of Onset  . Hypertension Other      Prior to Admission medications   Medication Sig Start Date End Date Taking? Authorizing Provider  albuterol (PROVENTIL HFA;VENTOLIN HFA) 108 (90 BASE) MCG/ACT inhaler Inhale 2 puffs into the lungs every 6 (six) hours as needed for wheezing or shortness of breath.    [provider]  ALPRAZolam Prudy Feeler(XANAX) 0.5 MG tablet Take 0.5 mg by mouth 3 (three) times daily.    [provider]  amLODipine (NORVASC) 5 MG tablet Take 1 tablet (5 mg total) by mouth daily. 06/29/18 07/29/18  Johnson, Clanford L, MD  budesonide-formoterol (SYMBICORT) 160-4.5 MCG/ACT inhaler Inhale 2 puffs into the lungs 2 (two) times daily. 01/30/18   [provider]  cholecalciferol (VITAMIN D) 1000 UNITS tablet Take 1,000 Units by mouth daily.    [provider]  clotrimazole-betamethasone (LOTRISONE) cream Apply to affected area twice daily 12/27/16   [provider]  fluticasone (FLONASE) 50 MCG/ACT nasal spray Place 2 sprays into both nostrils daily. 10/12/17   [provider]  leflunomide (ARAVA) 20 MG tablet Take 20 mg by mouth daily.    [provider]  metoprolol tartrate (LOPRESSOR) 25 MG tablet Take 1 tablet (25 mg total) by mouth 2 (two) times daily. 06/28/18 07/28/18  Cleora FleetJohnson, Clanford L, MD  Multiple Vitamin (MULTIVITAMIN WITH MINERALS) TABS tablet Take 1 tablet by mouth daily.    [provider]  ramipril (ALTACE) 10 MG capsule Take 20 mg by mouth daily.    [provider]  traMADol (ULTRAM) 50 MG tablet Take 50 mg by mouth 4 (four) times daily as needed for moderate pain.     [provider]    Physical Exam: Vitals:   12/31/18 1946 12/31/18 2100 12/31/18 2130 12/31/18 2200  BP: (!) 181/81 (!) 161/69 (!) 154/70 (!) 174/73  Pulse: 65 70 73 79  Resp: 18 18 12  (!) 21  Temp: 99 F (37.2 C)     TempSrc: Oral       SpO2: 96%       Constitutional: NAD, calm  Eyes: PERTLA, lids and conjunctivae normal ENMT: Mucous membranes are moist. Posterior pharynx clear of any exudate or lesions.   Neck: normal, supple, no masses, no thyromegaly Respiratory: clear to auscultation bilaterally, no wheezing, no crackles. Normal respiratory effort.    Cardiovascular: S1 & S2 heard, regular rate and rhythm. No extremity edema.   Abdomen: No distension, no tenderness, soft. Bowel sounds active.  Musculoskeletal: no clubbing / cyanosis. Right hip tender; RLE shortened and externally rotated; neurovascularly intact distally.   Skin: no significant rashes, lesions, ulcers. Poor turgor. Neurologic: CN 2-12 grossly intact. Sensation intact. Strength 5/5 in all 4 limbs.  Psychiatric: Alert and oriented to person, place, and situation. Pleasant and cooperative.    Labs on Admission: I have personally reviewed following labs and imaging studies  CBC: Recent Labs  Lab 12/31/18 2007  WBC 5.5  NEUTROABS 2.4  HGB 12.3  HCT 39.3  MCV  92.7  PLT 198   Basic Metabolic Panel: Recent Labs  Lab 12/31/18 2007  NA 136  K 4.1  CL 100  CO2 27  GLUCOSE 111*  BUN 15  CREATININE 1.00  CALCIUM 9.1   GFR: CrCl cannot be calculated (Unknown ideal weight.). Liver Function Tests: No results for input(s): AST, ALT, ALKPHOS, BILITOT, PROT, ALBUMIN in the last 168 hours. No results for input(s): LIPASE, AMYLASE in the last 168 hours. No results for input(s): AMMONIA in the last 168 hours. Coagulation Profile: Recent Labs  Lab 12/31/18 2007  INR 0.89   Cardiac Enzymes: No results for input(s): CKTOTAL, CKMB, CKMBINDEX, TROPONINI in the last 168 hours. BNP (last 3 results) No results for input(s): PROBNP in the last 8760 hours. HbA1C: No results for input(s): HGBA1C in the last 72 hours. CBG: No results for input(s): GLUCAP in the last 168 hours. Lipid Profile: No results for input(s): CHOL, HDL, LDLCALC, TRIG,  CHOLHDL, LDLDIRECT in the last 72 hours. Thyroid Function Tests: No results for input(s): TSH, T4TOTAL, FREET4, T3FREE, THYROIDAB in the last 72 hours. Anemia Panel: No results for input(s): VITAMINB12, FOLATE, FERRITIN, TIBC, IRON, RETICCTPCT in the last 72 hours. Urine analysis:    Component Value Date/Time   COLORURINE COLORLESS (A) 06/27/2018 0415   APPEARANCEUR CLEAR 06/27/2018 0415   LABSPEC 1.006 06/27/2018 0415   PHURINE 9.0 (H) 06/27/2018 0415   GLUCOSEU NEGATIVE 06/27/2018 0415   HGBUR NEGATIVE 06/27/2018 0415   BILIRUBINUR NEGATIVE 06/27/2018 0415   KETONESUR 5 (A) 06/27/2018 0415   PROTEINUR NEGATIVE 06/27/2018 0415   NITRITE NEGATIVE 06/27/2018 0415   LEUKOCYTESUR NEGATIVE 06/27/2018 0415   Sepsis Labs: @LABRCNTIP (procalcitonin:4,lacticidven:4) )No results found for this or any previous visit (from the past 240 hour(s)).   Radiological Exams on Admission: Dg Chest 1 View  Result Date: 12/31/2018 CLINICAL DATA:  85 year old female status post fall with proximal right femur fracture. EXAM: CHEST  1 VIEW COMPARISON:  Chest radiographs 06/27/2018 and earlier. FINDINGS: AP supine view at 2029 hours. Stable tortuosity of the thoracic aorta with calcified plaque. Other mediastinal contours are within normal limits. Visualized tracheal air column is within normal limits. Allowing for portable technique the lungs are clear. Osteopenia. Negative visible bowel gas patter. IMPRESSION: No acute cardiopulmonary abnormality. Electronically Signed   By: Odessa Fleming M.D.   On: 12/31/2018 21:17   Dg Hip Unilat With Pelvis 2-3 Views Right  Result Date: 12/31/2018 CLINICAL DATA:  85 year old female status post fall with right hip pain. EXAM: DG HIP (WITH OR WITHOUT PELVIS) 2-3V RIGHT COMPARISON:  Right hip series 04/14/2018. FINDINGS: Proximal right femur fracture through the right femoral neck with varus impaction, mild anterior displacement also. The right femoral head remains normally located.  The proximal right femur intertrochanteric segment appears intact. Chronic right inferior pubic ramus fracture. The pelvis appears stable and intact. Grossly intact proximal left femur. Calcified femoral artery atherosclerosis. Negative visible bowel gas pattern. IMPRESSION: Acute right femoral neck fracture with anterior displacement and varus impaction. Electronically Signed   By: Odessa Fleming M.D.   On: 12/31/2018 21:18    EKG: Independently reviewed. Sinus rhythm, 1st degree AV block, left atrial enlargement.   Assessment/Plan   1. Right hip fracture  - Presents with severe right pain and deformity after a mechanical fall at home  - Radiographs with acute right femoral neck fracture with anterior displacement and varus impaction - Orthopedic surgery is consulting and much appreciated  - Based on the available data, Ms. Gehret  presents an estimated 0.9% risk of perioperative MI or cardiac arrest per Nolon Nations al  - Continue pain-control, neurovascular checks, NPO after midnight, gentle IVF hydration    2. Hypertension  - SBP 180 in ED with pain likely contributing  - Continue Lopressor, hold ACE in anticipation of surgery, use hydralazine IVP's as needed    3. COPD  - Stable   - Continue ICS/LABA and as-needed albuterol     4. Anxiety  - Continue Xanax     DVT prophylaxis: SCD's  Code Status: Full  Family Communication: Husband and daughter updated at bedside  Consults called: Orthopedic surgery  Admission status: Inpatient     Briscoe Deutscher, MD [facility] Pager [phone]  If 7PM-7AM, please contact night-coverage [email or web address] Password [facility]  12/31/2018, 10:07 PM

## 2018-12-31 NOTE — ED Triage Notes (Signed)
Pt brought in via [facility]. Pt C/O right hip pain after "triping over her dishwasher and falling." There is shortening of the right leg.

## 2018-12-31 NOTE — ED Notes (Signed)
Purewick applied to patient. 

## 2019-01-01 LAB — TYPE AND SCREEN
ABO/RH(D): O NEG
Antibody Screen: NEGATIVE

## 2019-01-01 LAB — SURGICAL PCR SCREEN
MRSA, PCR: NEGATIVE
Staphylococcus aureus: NEGATIVE

## 2019-01-01 LAB — ABO/RH: ABO/RH(D): O NEG

## 2019-01-01 MED ORDER — SODIUM CHLORIDE 0.9 % IV SOLN
INTRAVENOUS | Status: DC
  Administered 2019-01-02: 07:00:00 via INTRAVENOUS

## 2019-01-01 MED ORDER — AMLODIPINE BESYLATE 5 MG PO TABS
5.0000 mg | ORAL_TABLET | Freq: Once | ORAL | Status: AC
  Administered 2019-01-01: 5 mg via ORAL
  Filled 2019-01-01: qty 1

## 2019-01-01 MED ORDER — AMLODIPINE BESYLATE 10 MG PO TABS
10.0000 mg | ORAL_TABLET | Freq: Every day | ORAL | Status: DC
  Administered 2019-01-02 – 2019-01-08 (×7): 10 mg via ORAL
  Filled 2019-01-01 (×7): qty 1

## 2019-01-01 MED ORDER — POVIDONE-IODINE 10 % EX SWAB: 2.0000 "application " | Freq: Once | CUTANEOUS | Status: DC

## 2019-01-01 MED ORDER — METHOCARBAMOL 500 MG PO TABS
500.0000 mg | ORAL_TABLET | Freq: Three times a day (TID) | ORAL | Status: DC | PRN
  Administered 2019-01-01: 500 mg via ORAL
  Filled 2019-01-01: qty 1

## 2019-01-01 MED ORDER — OXYCODONE HCL 5 MG PO TABS
5.0000 mg | ORAL_TABLET | ORAL | Status: DC | PRN
  Administered 2019-01-01: 10 mg via ORAL
  Administered 2019-01-01 – 2019-01-04 (×6): 5 mg via ORAL
  Filled 2019-01-01 (×3): qty 1
  Filled 2019-01-01: qty 2
  Filled 2019-01-01 (×3): qty 1

## 2019-01-01 MED ORDER — CEFAZOLIN SODIUM-DEXTROSE 2-4 GM/100ML-% IV SOLN: 2.0000 g | INTRAVENOUS | Status: DC

## 2019-01-01 MED ORDER — METHOCARBAMOL 1000 MG/10ML IJ SOLN
500.0000 mg | Freq: Three times a day (TID) | INTRAVENOUS | Status: DC | PRN
  Filled 2019-01-01: qty 5

## 2019-01-01 NOTE — Progress Notes (Signed)
Nutrition Brief Note  RD consulted for patient with hip fracture.  Weights are stable. Will monitor for any needs following surgery.  Wt Readings from Last 15 Encounters:  01/01/19 48.2 kg  11/03/18 47.2 kg  07/05/18 45.8 kg  06/27/18 45.8 kg  05/05/18 45.8 kg  04/19/17 48.5 kg  01/27/17 49.9 kg  01/11/17 49.4 kg    Body mass index is 20.75 kg/m. Patient meets criteria for normal based on current BMI.   Current diet order is regular, will be NPO 2/18 for surgery. Labs and medications reviewed.   No nutrition interventions warranted at this time. If nutrition issues arise, please consult RD.   Tilda Franco, MS, RD, LDN [facility] Inpatient Clinical Dietitian Pager: [phone] After Hours Pager: [phone]

## 2019-01-01 NOTE — Consult Note (Signed)
Reason for Consult:  Right femoral neck fracture Referring Physician: Medicine  [name] [name] is an 85 y.o. female.  HPI: [name] [name] is a 85 y.o. female who presented to the ER with severe right hip pain and deformity after a ground-level mechanical fall at home.  Patient reports that she been in her usual state of health, was having an uneventful day, and was trying to watch TV while she was doing that dishes, tripped over an open dishwasher, and landed onto her right side, experiencing immediate and severe pain at the right hip.  She has been unable to bear weight since the incident.   Family reports that she is quite active.  I discussed with her the risks, benefits and expectations of surgical procedure.  Risks including but not limited to the risk of anesthesia, blood clots, nerve damage, blood vessel damage, failure of the prosthesis, infection and up to including death.  Patient states she understands and wishes to proceed with surgery.  We will plan for a right hip hemiarthroplasty tomorrow 01/02/2019.  Patient be made n.p.o. after midnight and knows that she will most likely not proceed with surgery until after lunch.   She plans on going home once discharged from the hospital.  Past Medical History:  Diagnosis Date  . Anxiety   . Arthritis    rheumatoid  . COPD (chronic obstructive pulmonary disease) (HCC)   . Hypertension     Past Surgical History:  Procedure Laterality Date  . APPENDECTOMY    . CATARACT EXTRACTION W/PHACO Right 11/27/2013   Procedure: CATARACT EXTRACTION PHACO AND INTRAOCULAR LENS PLACEMENT (IOC);  Surgeon: Loraine Leriche T. Nile Riggs, MD;  Location: [facility];  Service: Ophthalmology;  Laterality: Right;  CDE:7.31  . CATARACT EXTRACTION W/PHACO Left 12/11/2013   Procedure: CATARACT EXTRACTION PHACO AND INTRAOCULAR LENS PLACEMENT (IOC);  Surgeon: Loraine Leriche T. Nile Riggs, MD;  Location: [facility];  Service: Ophthalmology;  Laterality: Left;  CDE:7.94  . HUMERUS FRACTURE SURGERY Left     had surgery x3 on it.  . REPLACEMENT TOTAL KNEE BILATERAL      Family History  Problem Relation Age of Onset  . Hypertension Other     Social History:  reports that she has quit smoking. Her smoking use included cigarettes. She has a 60.00 pack-year smoking history. She quit smokeless tobacco use about 21 years ago. She reports current alcohol use. She reports that she does not use drugs.  Allergies:  Allergies  Allergen Reactions  . Cortisone Other (See Comments)    Irregular Heart Beat  . Morphine And Related Nausea And Vomiting  . Motrin [Ibuprofen]   . Sulfa Antibiotics Hives  . Vicodin [Hydrocodone-Acetaminophen] Nausea And Vomiting  . Penicillins Hives    Has patient had a PCN reaction causing immediate rash, facial/tongue/throat swelling, SOB or lightheadedness with hypotension: Yes Has patient had a PCN reaction causing severe rash involving mucus membranes or skin necrosis: No Has patient had a PCN reaction that required hospitalization No Has patient had a PCN reaction occurring within the last 10 years: No If all of the above answers are "NO", then may proceed with Cephalosporin use.     Medications: I have reviewed the patient's current medications.  Results for orders placed or performed during the hospital encounter of 12/31/18 (from the past 48 hour(s))  Basic metabolic panel     Status: Abnormal   Collection Time: 12/31/18  8:07 PM  Result Value Ref Range   Sodium 136 135 - 145 mmol/L  Potassium 4.1 3.5 - 5.1 mmol/L   Chloride 100 98 - 111 mmol/L   CO2 27 22 - 32 mmol/L   Glucose, Bld 111 (H) 70 - 99 mg/dL   BUN 15 8 - 23 mg/dL   Creatinine, Ser 1.611.00 0.44 - 1.00 mg/dL   Calcium 9.1 8.9 - 09.610.3 mg/dL   GFR calc non Af Amer 52 (L) >60 mL/min   GFR calc Af Amer 60 (L) >60 mL/min   Anion gap 9 5 - 15    Comment: Performed at [facility], [address] [place], [place] [address]  CBC WITH DIFFERENTIAL     Status: None   Collection Time: 12/31/18  8:07 PM   Result Value Ref Range   WBC 5.5 4.0 - 10.5 K/uL   RBC 4.24 3.87 - 5.11 MIL/uL   Hemoglobin 12.3 12.0 - 15.0 g/dL   HCT 98.139.3 19.136.0 - 47.846.0 %   MCV 92.7 80.0 - 100.0 fL   MCH 29.0 26.0 - 34.0 pg   MCHC 31.3 30.0 - 36.0 g/dL   RDW 29.514.1 62.111.5 - 30.815.5 %   Platelets 198 150 - 400 K/uL   nRBC 0.0 0.0 - 0.2 %   Neutrophils Relative % 44 %   Neutro Abs 2.4 1.7 - 7.7 K/uL   Lymphocytes Relative 36 %   Lymphs Abs 2.0 0.7 - 4.0 K/uL   Monocytes Relative 14 %   Monocytes Absolute 0.8 0.1 - 1.0 K/uL   Eosinophils Relative 5 %   Eosinophils Absolute 0.3 0.0 - 0.5 K/uL   Basophils Relative 1 %   Basophils Absolute 0.0 0.0 - 0.1 K/uL   Immature Granulocytes 0 %   Abs Immature Granulocytes 0.02 0.00 - 0.07 K/uL    Comment: Performed at [facility], [address] [place], [place] [address]  Protime-INR     Status: None   Collection Time: 12/31/18  8:07 PM  Result Value Ref Range   Prothrombin Time 12.0 11.4 - 15.2 seconds   INR 0.89     Comment: Performed at [facility], [address] [place], [place] [address]  Type and screen [facility]     Status: None   Collection Time: 12/31/18  8:07 PM  Result Value Ref Range   ABO/RH(D) O NEG    Antibody Screen NEG    Sample Expiration      01/03/2019 Performed at [facility], [address] [place], [place] [address]   Surgical pcr screen     Status: None   Collection Time: 01/01/19  3:01 AM  Result Value Ref Range   MRSA, PCR NEGATIVE NEGATIVE   Staphylococcus aureus NEGATIVE NEGATIVE    Comment: (NOTE) The Xpert SA Assay (FDA approved for NASAL specimens in patients 83 years of age and older), is one component of a comprehensive surveillance program. It is not intended to diagnose infection nor to guide or monitor treatment. Performed at [facility], [address] [place], [place] [address]     Dg Chest 1 View  Result Date: 12/31/2018 CLINICAL DATA:  85 year old female status post fall with proximal  right femur fracture. EXAM: CHEST  1 VIEW COMPARISON:  Chest radiographs 06/27/2018 and earlier. FINDINGS: AP supine view at 2029 hours. Stable tortuosity of the thoracic aorta with calcified plaque. Other mediastinal contours are within normal limits. Visualized tracheal air column is within normal limits. Allowing for portable technique the lungs are clear. Osteopenia. Negative visible bowel gas patter. IMPRESSION: No acute cardiopulmonary  abnormality. Electronically Signed   By: Odessa Fleming M.D.   On: 12/31/2018 21:17   Dg Hip Unilat With Pelvis 2-3 Views Right  Result Date: 12/31/2018 CLINICAL DATA:  85 year old female status post fall with right hip pain. EXAM: DG HIP (WITH OR WITHOUT PELVIS) 2-3V RIGHT COMPARISON:  Right hip series 04/14/2018. FINDINGS: Proximal right femur fracture through the right femoral neck with varus impaction, mild anterior displacement also. The right femoral head remains normally located. The proximal right femur intertrochanteric segment appears intact. Chronic right inferior pubic ramus fracture. The pelvis appears stable and intact. Grossly intact proximal left femur. Calcified femoral artery atherosclerosis. Negative visible bowel gas pattern. IMPRESSION: Acute right femoral neck fracture with anterior displacement and varus impaction. Electronically Signed   By: Odessa Fleming M.D.   On: 12/31/2018 21:18    Review of Systems  Constitutional: Negative.   HENT: Negative.   Eyes: Negative.   Respiratory: Positive for cough.   Cardiovascular: Negative.   Gastrointestinal: Negative.   Genitourinary: Negative.   Musculoskeletal: Positive for joint pain.  Skin: Negative.   Neurological: Negative.   Endo/Heme/Allergies: Negative.   Psychiatric/Behavioral: The patient is nervous/anxious.    Blood pressure (!) 188/78, pulse 81, temperature 98.8 F (37.1 C), temperature source Oral, resp. rate 16, weight 48.2 kg, SpO2 98 %. Physical Exam  Constitutional: She is oriented to  person, place, and time. She appears well-developed.  HENT:  Head: Normocephalic.  Eyes: Pupils are equal, round, and reactive to light.  Neck: Neck supple. No JVD present. No tracheal deviation present. No thyromegaly present.  Cardiovascular: Normal rate, regular rhythm and intact distal pulses.  Respiratory: Effort normal and breath sounds normal. No respiratory distress. She has no wheezes.  GI: Soft. There is no abdominal tenderness. There is no guarding.  Musculoskeletal:     Right hip: She exhibits decreased range of motion, decreased strength, tenderness, bony tenderness, swelling and deformity.  Lymphadenopathy:    She has no cervical adenopathy.  Neurological: She is alert and oriented to person, place, and time.  Skin: Skin is warm and dry.  Psychiatric: She has a normal mood and affect.    Assessment/Plan: Right femoral neck fracture  Plan on a right hip hemiarthroplasty tomorrow, 01/02/19, per Dr. Charlann Boxer Consent and NPO orders placed OR called and case scheduled Will order a muscle relaxer as she is having muscle spasms in the right leg Evaluate meds for oral analgesic medication   Genelle Gather [facility] 01/01/2019, 11:30 AM

## 2019-01-01 NOTE — Progress Notes (Signed)
PROGRESS NOTE    [name] [name]   ASU:[id number]  DOB: 08/17/1932  DOA: 12/31/2018 PCP: Richmond Campbell., PA-C   Brief Narrative:  [name] [name]  is a 85 y.o. female with medical history significant for arthritis status post bilateral knee replacement, anxiety, COPD, and hypertension, now presenting to the emergency department with severe right hip pain and deformity after a ground-level mechanical fall at home.    Subjective: She has no specific complaints. She is wearing O2 and when asked about this, she tells me she was ordered to wear this only at night as her pulse ox drops when she sleeps. No formal sleep study has been done.     Assessment & Plan:   Principal Problem:   Closed right hip fracture, initial encounter  - management per ortho- plan for OR tomorrow  Active Problems:   Essential hypertension - cont Lopressor, Amlodipine- will need to increase Amlodipine from 5- 10 mg for now as SBP is staying > 170. I will give her an extra dose of Amlodipine 5 mg now.  - cont PRN Hydralazine as well.     COPD (chronic obstructive pulmonary disease) (H -stable- cont Dulera and PRN Albuterol  Hypoxia at night? - will check overnight pulse oximetry- if she truly does drop at night, she will need a formal sleep study    Anxiety - cont Xanax   Time spent in minutes: 40 DVT prophylaxis: SCDs Code Status: Full code Family Communication: husband Disposition Plan: OR tomorrow Consultants:   ortho Procedures:   none Antimicrobials:  Anti-infectives (From admission, onward)   Start     Dose/Rate Route Frequency Ordered Stop   01/01/19 1200  ceFAZolin (ANCEF) IVPB 2g/100 mL premix  Status:  Discontinued     2 g 200 mL/hr over 30 Minutes Intravenous On call to O.R. 01/01/19 1153 01/01/19 1203       Objective: Vitals:   01/01/19 1034 01/01/19 1035 01/01/19 1303 01/01/19 1312  BP: (!) 188/78 (!) 188/78  (!) 171/71  Pulse:  81  69  Resp:    16  Temp:    99 F  (37.2 C)  TempSrc:    Oral  SpO2:  98% 93% 90%  Weight:        Intake/Output Summary (Last 24 hours) at 01/01/2019 1333 Last data filed at 01/01/2019 1305 Gross per 24 hour  Intake 612.42 ml  Output 950 ml  Net -337.58 ml   Filed Weights   01/01/19 0800  Weight: 48.2 kg    Examination: General exam: Appears comfortable  HEENT: PERRLA, oral mucosa moist, no sclera icterus or thrush Respiratory system: Clear to auscultation. Respiratory effort normal. Cardiovascular system: S1 & S2 heard, RRR.   Gastrointestinal system: Abdomen soft, non-tender, nondistended. Normal bowel sounds. Central nervous system: Alert and oriented. No focal neurological deficits. Extremities: No cyanosis, clubbing or edema Skin: No rashes or ulcers Psychiatry:  Mood & affect appropriate.     Data Reviewed: I have personally reviewed following labs and imaging studies  CBC: Recent Labs  Lab 12/31/18 2007  WBC 5.5  NEUTROABS 2.4  HGB 12.3  HCT 39.3  MCV 92.7  PLT 198   Basic Metabolic Panel: Recent Labs  Lab 12/31/18 2007  NA 136  K 4.1  CL 100  CO2 27  GLUCOSE 111*  BUN 15  CREATININE 1.00  CALCIUM 9.1   GFR: Estimated Creatinine Clearance: 30.1 mL/min (by C-G formula based on SCr of 1 mg/dL). Liver Function Tests:  No results for input(s): AST, ALT, ALKPHOS, BILITOT, PROT, ALBUMIN in the last 168 hours. No results for input(s): LIPASE, AMYLASE in the last 168 hours. No results for input(s): AMMONIA in the last 168 hours. Coagulation Profile: Recent Labs  Lab 12/31/18 2007  INR 0.89   Cardiac Enzymes: No results for input(s): CKTOTAL, CKMB, CKMBINDEX, TROPONINI in the last 168 hours. BNP (last 3 results) No results for input(s): PROBNP in the last 8760 hours. HbA1C: No results for input(s): HGBA1C in the last 72 hours. CBG: No results for input(s): GLUCAP in the last 168 hours. Lipid Profile: No results for input(s): CHOL, HDL, LDLCALC, TRIG, CHOLHDL, LDLDIRECT in the  last 72 hours. Thyroid Function Tests: No results for input(s): TSH, T4TOTAL, FREET4, T3FREE, THYROIDAB in the last 72 hours. Anemia Panel: No results for input(s): VITAMINB12, FOLATE, FERRITIN, TIBC, IRON, RETICCTPCT in the last 72 hours. Urine analysis:    Component Value Date/Time   COLORURINE COLORLESS (A) 06/27/2018 0415   APPEARANCEUR CLEAR 06/27/2018 0415   LABSPEC 1.006 06/27/2018 0415   PHURINE 9.0 (H) 06/27/2018 0415   GLUCOSEU NEGATIVE 06/27/2018 0415   HGBUR NEGATIVE 06/27/2018 0415   BILIRUBINUR NEGATIVE 06/27/2018 0415   KETONESUR 5 (A) 06/27/2018 0415   PROTEINUR NEGATIVE 06/27/2018 0415   NITRITE NEGATIVE 06/27/2018 0415   LEUKOCYTESUR NEGATIVE 06/27/2018 0415   Sepsis Labs: @LABRCNTIP (procalcitonin:4,lacticidven:4) ) Recent Results (from the past 240 hour(s))  Surgical pcr screen     Status: None   Collection Time: 01/01/19  3:01 AM  Result Value Ref Range Status   MRSA, PCR NEGATIVE NEGATIVE Final   Staphylococcus aureus NEGATIVE NEGATIVE Final    Comment: (NOTE) The Xpert SA Assay (FDA approved for NASAL specimens in patients 83 years of age and older), is one component of a comprehensive surveillance program. It is not intended to diagnose infection nor to guide or monitor treatment. Performed at [facility], [address] [place], [place] [id number]          Radiology Studies: Dg Chest 1 View  Result Date: 12/31/2018 CLINICAL DATA:  85 year old female status post fall with proximal right femur fracture. EXAM: CHEST  1 VIEW COMPARISON:  Chest radiographs 06/27/2018 and earlier. FINDINGS: AP supine view at 2029 hours. Stable tortuosity of the thoracic aorta with calcified plaque. Other mediastinal contours are within normal limits. Visualized tracheal air column is within normal limits. Allowing for portable technique the lungs are clear. Osteopenia. Negative visible bowel gas patter. IMPRESSION: No acute cardiopulmonary  abnormality. Electronically Signed   By: Odessa FlemingH  Hall M.D.   On: 12/31/2018 21:17   Dg Hip Unilat With Pelvis 2-3 Views Right  Result Date: 12/31/2018 CLINICAL DATA:  85 year old female status post fall with right hip pain. EXAM: DG HIP (WITH OR WITHOUT PELVIS) 2-3V RIGHT COMPARISON:  Right hip series 04/14/2018. FINDINGS: Proximal right femur fracture through the right femoral neck with varus impaction, mild anterior displacement also. The right femoral head remains normally located. The proximal right femur intertrochanteric segment appears intact. Chronic right inferior pubic ramus fracture. The pelvis appears stable and intact. Grossly intact proximal left femur. Calcified femoral artery atherosclerosis. Negative visible bowel gas pattern. IMPRESSION: Acute right femoral neck fracture with anterior displacement and varus impaction. Electronically Signed   By: Odessa FlemingH  Hall M.D.   On: 12/31/2018 21:18      Scheduled Meds: . ALPRAZolam  0.5 mg Oral TID  . amLODipine  5 mg Oral Daily  . metoprolol tartrate  25 mg Oral  BID  . mometasone-formoterol  2 puff Inhalation BID  . ondansetron (ZOFRAN) IV  4 mg Intravenous Once  . povidone-iodine  2 application Topical Once   Continuous Infusions: . methocarbamol (ROBAXIN) IV       LOS: 1 day      Calvert CantorSaima Maisyn Nouri, MD [facility] Pager: [email or web address] Password [email or web address] 01/01/2019, 1:33 PM

## 2019-01-01 NOTE — Plan of Care (Signed)

## 2019-01-02 ENCOUNTER — Inpatient Hospital Stay (HOSPITAL_COMMUNITY): Payer: Medicare HMO

## 2019-01-02 ENCOUNTER — Inpatient Hospital Stay (HOSPITAL_COMMUNITY): Payer: Medicare HMO | Admitting: Certified Registered"

## 2019-01-02 ENCOUNTER — Encounter (HOSPITAL_COMMUNITY)
Admission: EM | Disposition: A | Discharge status: Home Health | Payer: Self-pay | Source: Home / Self Care | Attending: Student

## 2019-01-02 HISTORY — PX: HIP ARTHROPLASTY: SHX981

## 2019-01-02 SURGERY — HEMIARTHROPLASTY, HIP, DIRECT ANTERIOR APPROACH, FOR FRACTURE
Anesthesia: Monitor Anesthesia Care | Site: Hip | Laterality: Right

## 2019-01-02 MED ORDER — ASPIRIN EC 325 MG PO TBEC
325.0000 mg | DELAYED_RELEASE_TABLET | Freq: Two times a day (BID) | ORAL | Status: DC
  Administered 2019-01-03 – 2019-01-08 (×10): 325 mg via ORAL
  Filled 2019-01-02 (×11): qty 1

## 2019-01-02 MED ORDER — PROPOFOL 10 MG/ML IV BOLUS
INTRAVENOUS | Status: AC
  Filled 2019-01-02: qty 40

## 2019-01-02 MED ORDER — VANCOMYCIN HCL IN DEXTROSE 1-5 GM/200ML-% IV SOLN
1000.0000 mg | Freq: Two times a day (BID) | INTRAVENOUS | Status: AC
  Administered 2019-01-03: 1000 mg via INTRAVENOUS
  Filled 2019-01-02: qty 200

## 2019-01-02 MED ORDER — ONDANSETRON HCL 4 MG/2ML IJ SOLN
INTRAMUSCULAR | Status: DC | PRN
  Administered 2019-01-02: 4 mg via INTRAVENOUS

## 2019-01-02 MED ORDER — STERILE WATER FOR IRRIGATION IR SOLN
Status: DC | PRN
  Administered 2019-01-02: 2000 mL

## 2019-01-02 MED ORDER — GENTAMICIN SULFATE 40 MG/ML IJ SOLN
5.0000 mg/kg | Freq: Once | INTRAVENOUS | Status: AC
  Administered 2019-01-02: 230 mg via INTRAVENOUS
  Filled 2019-01-02: qty 5.75

## 2019-01-02 MED ORDER — PROPOFOL 500 MG/50ML IV EMUL
INTRAVENOUS | Status: DC | PRN
  Administered 2019-01-02: 25 ug/kg/min via INTRAVENOUS

## 2019-01-02 MED ORDER — METHOCARBAMOL 500 MG PO TABS: 500.0000 mg | ORAL_TABLET | Freq: Four times a day (QID) | ORAL | 0 refills | Status: DC | PRN

## 2019-01-02 MED ORDER — OXYCODONE HCL 5 MG PO TABS: 5.0000 mg | ORAL_TABLET | ORAL | 0 refills | Status: DC | PRN

## 2019-01-02 MED ORDER — PROPOFOL 10 MG/ML IV BOLUS
INTRAVENOUS | Status: DC | PRN
  Administered 2019-01-02 (×4): 10 mg via INTRAVENOUS

## 2019-01-02 MED ORDER — ONDANSETRON HCL 4 MG/2ML IJ SOLN: 4.0000 mg | Freq: Four times a day (QID) | INTRAMUSCULAR | Status: DC | PRN

## 2019-01-02 MED ORDER — FENTANYL CITRATE (PF) 100 MCG/2ML IJ SOLN
INTRAMUSCULAR | Status: AC
  Filled 2019-01-02: qty 2

## 2019-01-02 MED ORDER — BUPIVACAINE IN DEXTROSE 0.75-8.25 % IT SOLN
INTRATHECAL | Status: DC | PRN
  Administered 2019-01-02: 1.6 mL via INTRATHECAL

## 2019-01-02 MED ORDER — FENTANYL CITRATE (PF) 100 MCG/2ML IJ SOLN: 25.0000 ug | INTRAMUSCULAR | Status: DC | PRN

## 2019-01-02 MED ORDER — MENTHOL 3 MG MT LOZG: 1.0000 | LOZENGE | OROMUCOSAL | Status: DC | PRN

## 2019-01-02 MED ORDER — METOCLOPRAMIDE HCL 5 MG PO TABS: 5.0000 mg | ORAL_TABLET | Freq: Three times a day (TID) | ORAL | Status: DC | PRN

## 2019-01-02 MED ORDER — FENTANYL CITRATE (PF) 100 MCG/2ML IJ SOLN
50.0000 ug | Freq: Once | INTRAMUSCULAR | Status: AC
  Administered 2019-01-02: 50 ug via INTRAVENOUS

## 2019-01-02 MED ORDER — ACETAMINOPHEN 500 MG PO TABS: 1000.0000 mg | ORAL_TABLET | Freq: Three times a day (TID) | ORAL | 0 refills | Status: DC

## 2019-01-02 MED ORDER — LIDOCAINE 2% (20 MG/ML) 5 ML SYRINGE
INTRAMUSCULAR | Status: DC | PRN
  Administered 2019-01-02: 40 mg via INTRAVENOUS

## 2019-01-02 MED ORDER — MIDAZOLAM HCL 2 MG/2ML IJ SOLN
INTRAMUSCULAR | Status: AC
  Filled 2019-01-02: qty 2

## 2019-01-02 MED ORDER — DOCUSATE SODIUM 100 MG PO CAPS
100.0000 mg | ORAL_CAPSULE | Freq: Two times a day (BID) | ORAL | Status: DC
  Administered 2019-01-02 – 2019-01-08 (×7): 100 mg via ORAL
  Filled 2019-01-02 (×9): qty 1

## 2019-01-02 MED ORDER — FENTANYL CITRATE (PF) 100 MCG/2ML IJ SOLN
INTRAMUSCULAR | Status: DC | PRN
  Administered 2019-01-02: 50 ug via INTRAVENOUS

## 2019-01-02 MED ORDER — PHENYLEPHRINE 40 MCG/ML (10ML) SYRINGE FOR IV PUSH (FOR BLOOD PRESSURE SUPPORT)
PREFILLED_SYRINGE | INTRAVENOUS | Status: DC | PRN
  Administered 2019-01-02: 160 ug via INTRAVENOUS
  Administered 2019-01-02: 200 ug via INTRAVENOUS
  Administered 2019-01-02: 80 ug via INTRAVENOUS

## 2019-01-02 MED ORDER — SODIUM CHLORIDE 0.9 % IV SOLN
INTRAVENOUS | Status: DC | PRN
  Administered 2019-01-02: 25 ug/min via INTRAVENOUS

## 2019-01-02 MED ORDER — 0.9 % SODIUM CHLORIDE (POUR BTL) OPTIME
TOPICAL | Status: DC | PRN
  Administered 2019-01-02: 1000 mL

## 2019-01-02 MED ORDER — MIDAZOLAM HCL 2 MG/2ML IJ SOLN
INTRAMUSCULAR | Status: DC | PRN
  Administered 2019-01-02: 1 mg via INTRAVENOUS

## 2019-01-02 MED ORDER — METOCLOPRAMIDE HCL 5 MG/ML IJ SOLN: 5.0000 mg | Freq: Three times a day (TID) | INTRAMUSCULAR | Status: DC | PRN

## 2019-01-02 MED ORDER — FERROUS SULFATE 325 (65 FE) MG PO TABS
325.0000 mg | ORAL_TABLET | Freq: Three times a day (TID) | ORAL | Status: DC
  Administered 2019-01-03: 325 mg via ORAL
  Filled 2019-01-02: qty 1

## 2019-01-02 MED ORDER — VANCOMYCIN HCL IN DEXTROSE 1-5 GM/200ML-% IV SOLN
1000.0000 mg | Freq: Once | INTRAVENOUS | Status: AC
  Administered 2019-01-02: 1000 mg via INTRAVENOUS
  Filled 2019-01-02: qty 200

## 2019-01-02 MED ORDER — LACTATED RINGERS IV SOLN
INTRAVENOUS | Status: DC
  Administered 2019-01-02: 14:00:00 via INTRAVENOUS

## 2019-01-02 MED ORDER — PHENOL 1.4 % MT LIQD: 1.0000 | OROMUCOSAL | Status: DC | PRN

## 2019-01-02 MED ORDER — ASPIRIN EC 325 MG PO TBEC: 325.0000 mg | DELAYED_RELEASE_TABLET | Freq: Two times a day (BID) | ORAL | 0 refills | Status: DC

## 2019-01-02 MED ORDER — LEFLUNOMIDE 20 MG PO TABS: 20.0000 mg | ORAL_TABLET | Freq: Every day | ORAL | Status: DC

## 2019-01-02 MED ORDER — ONDANSETRON HCL 4 MG PO TABS: 4.0000 mg | ORAL_TABLET | Freq: Four times a day (QID) | ORAL | Status: DC | PRN

## 2019-01-02 SURGICAL SUPPLY — 48 items
ADH SKN CLS APL DERMABOND .7 (GAUZE/BANDAGES/DRESSINGS) ×1
BAG SPEC THK2 15X12 ZIP CLS (MISCELLANEOUS) ×1
BAG ZIPLOCK 12X15 (MISCELLANEOUS) ×3 IMPLANT
BLADE SAW SGTL 11.0X1.19X90.0M (BLADE) IMPLANT
BLADE SAW SGTL 18X1.27X75 (BLADE) ×2 IMPLANT
BLADE SAW SGTL 18X1.27X75MM (BLADE) ×1
COVER SURGICAL LIGHT HANDLE (MISCELLANEOUS) ×3 IMPLANT
COVER WAND RF STERILE (DRAPES) IMPLANT
DERMABOND ADVANCED (GAUZE/BANDAGES/DRESSINGS) ×2
DERMABOND ADVANCED .7 DNX12 (GAUZE/BANDAGES/DRESSINGS) ×1 IMPLANT
DRAPE ORTHO SPLIT 77X108 STRL (DRAPES) ×6
DRAPE POUCH INSTRU U-SHP 10X18 (DRAPES) ×3 IMPLANT
DRAPE SURG 17X11 SM STRL (DRAPES) ×3 IMPLANT
DRAPE SURG ORHT 6 SPLT 77X108 (DRAPES) ×2 IMPLANT
DRAPE U-SHAPE 47X51 STRL (DRAPES) ×3 IMPLANT
DRESSING AQUACEL AG SP 3.5X10 (GAUZE/BANDAGES/DRESSINGS) IMPLANT
DRSG AQUACEL AG ADV 3.5X10 (GAUZE/BANDAGES/DRESSINGS) ×3 IMPLANT
DRSG AQUACEL AG SP 3.5X10 (GAUZE/BANDAGES/DRESSINGS) ×3
DRSG TEGADERM 4X4.75 (GAUZE/BANDAGES/DRESSINGS) ×3 IMPLANT
DURAPREP 26ML APPLICATOR (WOUND CARE) ×5 IMPLANT
ELECT BLADE TIP CTD 4 INCH (ELECTRODE) ×3 IMPLANT
ELECT REM PT RETURN 15FT ADLT (MISCELLANEOUS) ×3 IMPLANT
GLOVE BIOGEL PI IND STRL 7.5 (GLOVE) ×1 IMPLANT
GLOVE BIOGEL PI IND STRL 8.5 (GLOVE) ×1 IMPLANT
GLOVE BIOGEL PI INDICATOR 7.5 (GLOVE) ×2
GLOVE BIOGEL PI INDICATOR 8.5 (GLOVE) ×2
GLOVE ECLIPSE 8.0 STRL XLNG CF (GLOVE) ×6 IMPLANT
GLOVE ORTHO TXT STRL SZ7.5 (GLOVE) ×6 IMPLANT
GOWN STRL REUS W/TWL 2XL LVL3 (GOWN DISPOSABLE) ×3 IMPLANT
GOWN STRL REUS W/TWL LRG LVL3 (GOWN DISPOSABLE) ×3 IMPLANT
HEAD FEM UNIPOLAR 46 OD STRL (Hips) ×2 IMPLANT
KIT BASIN OR (CUSTOM PROCEDURE TRAY) ×3 IMPLANT
MANIFOLD NEPTUNE II (INSTRUMENTS) ×3 IMPLANT
PACK TOTAL JOINT (CUSTOM PROCEDURE TRAY) ×3 IMPLANT
PROTECTOR NERVE ULNAR (MISCELLANEOUS) ×3 IMPLANT
SPACER FEM TAPERED +0 12/14 (Hips) ×2 IMPLANT
STEM TRI LOC BPS SZ3 W GRIPTON IMPLANT
SUT ETHIBOND 2 (SUTURE) IMPLANT
SUT MNCRL AB 4-0 PS2 18 (SUTURE) ×3 IMPLANT
SUT STRATAFIX 0 PDS 27 VIOLET (SUTURE) ×3
SUT VIC AB 1 CT1 36 (SUTURE) ×3 IMPLANT
SUT VIC AB 2-0 CT1 27 (SUTURE) ×6
SUT VIC AB 2-0 CT1 TAPERPNT 27 (SUTURE) ×2 IMPLANT
SUTURE STRATFX 0 PDS 27 VIOLET (SUTURE) ×1 IMPLANT
TOWEL OR 17X26 10 PK STRL BLUE (TOWEL DISPOSABLE) ×6 IMPLANT
TOWEL OR NON WOVEN STRL DISP B (DISPOSABLE) ×3 IMPLANT
TRAY FOLEY CATH 14FRSI W/METER (CATHETERS) ×2 IMPLANT
TRI LOC BPS SZ 3 W GRIPTON ×3 IMPLANT

## 2019-01-02 NOTE — Op Note (Signed)
NAME:  [name] [name]                ACCOUNT NO.:  [id number]   MEDICAL RECORD NO.: [id number]   LOCATION:  1435                         [facility]   DATE OF BIRTH:  2032-07-29  PHYSICIAN:  Madlyn FrankelMatthew D. Charlann Boxerlin, M.D.     DATE OF PROCEDURE:  01/02/19                               OPERATIVE REPORT     PREOPERATIVE DIAGNOSIS:  Right displaced femoral neck fracture.   POSTOPERATIVE DIAGNOSIS:  Right displaced femoral neck fracture.   PROCEDURE:  Right hip hemiarthroplasty utilizing DePuy component, size 3 Hi Tri-Lock stem with a 45 mm unipolar ball with a +0 adapter.   SURGEON:  Madlyn FrankelMatthew D. Charlann Boxerlin, MD   ASSISTANT:  Lanney GinsMatthew Babish, PA-C.   ANESTHESIA:  General.   SPECIMENS:  None.   DRAINS:  None.   BLOOD LOSS:  About 100 cc.   COMPLICATIONS:  None.   INDICATION OF PROCEDURE:  [name] is a pleasnt 85 year old female who lives with her husband independently.  She unfortunately had a fall at her house when she tripped over the dishwasher door when it was open.  She was admitted to the hospital after radiographs revealed a femoral neck fracture.  She was seen and evaluated and was scheduled for surgery for fixation.  The necessity of surgical repair was discussed with she and her family.  Consent was obtained after reviewing risks of infection, DVT, component failure, and need for revision surgery.   PROCEDURE IN DETAIL:  The patient was brought to the operative theater. Once adequate anesthesia, preoperative antibiotics, 1 g of Vancomycin, and weight dosed Gentamycin administered, the patient was positioned into the left lateral decubitus position with the right side up.  The right lower extremity was then prepped and draped in sterile fashion.  A time-out was performed identifying the patient, planned procedure, and extremity.   A lateral incision was made off the proximal trochanter. Sharp dissection was carried down to the iliotibial band and gluteal fascia. The gluteal fascia  was then incised for posterior approach.  The short external rotators were taken down separate from the posterior capsule. An L capsulotomy was made preserving the posterior leaflet for later anatomic repair. Fracture site was identified and after removing comminuted segments of the posterior femoral neck, the femoral head was removed without difficulty and measured on the back table  using the sizing rings and determined to be 45 mm in diameter.   The proximal femur was then exposed.  Retractors placed.  I then drilled, opened the proximal femur.  Then I hand reamed once and  Irrigated the canal to try to prevent fat emboli.  I began broaching the femur with a starter broach up to a size 3 broach with good medial and lateral metaphyseal fit without evidence of any torsion or movement.  A trial reduction was carried out with a high offset neck and a +0 adapter with a 45mm ball.  The hip reduced nicely.  The leg lengths appeared to be equal compared to the down leg.   The hip went through a range of motion without evidence of any subluxation or impingement.   Given these findings, the trial components removed.  The final 3 Hi  Tri-Lock stem was opened.  After irrigating the canal, the final stem was impacted and sat at the level where the broach was. Based on this and the trial reduction, a +0 adapter was opened and impacted in the 45 mm unipolar ball onto a clean and dry trunnion.  The hip had been irrigated throughout the case and again at this point.  I re- Approximated the posterior capsule to the superior leaflet using a  #1 Vicryl.  The remainder of the wound was closed with #1 Vicryl in the iliotibial band and gluteal fascia, a  2-0 Vicryl in the sub-Q tissue and a running 4-0 Monocryl in the skin.  The hip was cleaned, dried, and dressed sterilely using Dermabond and Aquacel dressing.  She was then brought to recovery room, extubated in stable condition, tolerating the procedure  well.  [name], PA-C was present and utilized as Geophysicist/field seismologist for the entire case from  Preoperative positioning to management of the contralateral extremity and retractors to  General facilitation of the procedure.  He was also involved with primary wound closure.         [name] Charlann Boxer, M.D.

## 2019-01-02 NOTE — Progress Notes (Signed)
Patient's sats 94% while awake on room. Sats checked again while `q 1asleep on room air mid 80's. Unable to hold sats >90 on 2 liters nasal canula while asleep. Sats 95-99% on 3 liters O2 while asleep.

## 2019-01-02 NOTE — Anesthesia Preprocedure Evaluation (Addendum)
Anesthesia Evaluation  Patient identified by MRN, date of birth, ID band Patient awake    Reviewed: Allergy & Precautions, NPO status , Patient's Chart, lab work & pertinent test results, reviewed documented beta blocker date and time   Airway Mallampati: II  TM Distance: >3 FB Neck ROM: Full    Dental   Pulmonary COPD, former smoker,    breath sounds clear to auscultation       Cardiovascular hypertension, Pt. on medications and Pt. on home beta blockers  Rhythm:Regular Rate:Normal     Neuro/Psych  Headaches,    GI/Hepatic negative GI ROS, Neg liver ROS,   Endo/Other  negative endocrine ROS  Renal/GU negative Renal ROS     Musculoskeletal  (+) Arthritis ,   Abdominal   Peds  Hematology negative hematology ROS (+)   Anesthesia Other Findings   Reproductive/Obstetrics                             Lab Results  Component Value Date   WBC 5.5 12/31/2018   HGB 12.3 12/31/2018   HCT 39.3 12/31/2018   MCV 92.7 12/31/2018   PLT 198 12/31/2018   Lab Results  Component Value Date   CREATININE 1.00 12/31/2018   BUN 15 12/31/2018   NA 136 12/31/2018   K 4.1 12/31/2018   CL 100 12/31/2018   CO2 27 12/31/2018    Anesthesia Physical Anesthesia Plan  ASA: III  Anesthesia Plan: MAC and Spinal   Post-op Pain Management:    Induction: Intravenous  PONV Risk Score and Plan: 2 and Ondansetron, Propofol infusion and Treatment may vary due to age or medical condition  Airway Management Planned: Natural Airway and Simple Face Mask  Additional Equipment:   Intra-op Plan:   Post-operative Plan:   Informed Consent: I have reviewed the patients History and Physical, chart, labs and discussed the procedure including the risks, benefits and alternatives for the proposed anesthesia with the patient or authorized representative who has indicated his/her understanding and acceptance.        Plan Discussed with: CRNA  Anesthesia Plan Comments:        Anesthesia Quick Evaluation

## 2019-01-02 NOTE — Anesthesia Procedure Notes (Signed)
Date/Time: 01/02/2019 1:52 PM Performed by: Minerva Ends, CRNA Pre-anesthesia Checklist: Patient identified, Emergency Drugs available, Suction available and Patient being monitored Patient Re-evaluated:Patient Re-evaluated prior to induction Oxygen Delivery Method: Simple face mask Placement Confirmation: breath sounds checked- equal and bilateral and positive ETCO2 Dental Injury: Teeth and Oropharynx as per pre-operative assessment  Comments: Sedation for spinal

## 2019-01-02 NOTE — Anesthesia Procedure Notes (Signed)
Spinal  Patient location during procedure: OR Start time: 01/02/2019 2:00 PM End time: 01/02/2019 2:05 PM Staffing Anesthesiologist: Marcene Duos, MD Performed: anesthesiologist  Preanesthetic Checklist Completed: patient identified, site marked, surgical consent, pre-op evaluation, timeout performed, IV checked, risks and benefits discussed and monitors and equipment checked Spinal Block Patient position: sitting Prep: DuraPrep Patient monitoring: heart rate, cardiac monitor, continuous pulse ox and blood pressure Approach: midline Location: L4-5 Injection technique: single-shot Needle Needle type: Pencan  Needle gauge: 24 G Needle length: 9 cm Assessment Sensory level: T4

## 2019-01-02 NOTE — H&P (View-Only) (Signed)
Patient ID: [name] [name], female   DOB: 09/27/1932, 86 y.o.   MRN: [id number]   Resting this am No events since seen last evening  AFVSS Labs reviewed  Right displaced femoral neck fracture  NPO Consent ordered To OR today for right hip hemiarthroplasty 

## 2019-01-02 NOTE — Progress Notes (Signed)
PROGRESS NOTE    [name] [name]   FAO:[id number]  DOB: [birth year]  DOA: 12/31/2018 PCP: Richmond CampbellKaplan, Kristen W., PA-C   Brief Narrative:  [name] [name]  is a 85 y.o. female with medical history significant for arthritis status post bilateral knee replacement, anxiety, COPD, and hypertension, now presenting to the emergency department with severe right hip pain and deformity after a ground-level mechanical fall at home.    Subjective: On and off pain in right hip otherwise no complaints. Will got to OR this afternoon.    Assessment & Plan:   Principal Problem:   Closed right hip fracture, initial encounter  - management per ortho- plan for OR today- cont maintenance IVF until she is able to eat and drink post op  Active Problems:   Essential hypertension - cont Lopressor, Amlodipine-   increased Amlodipine from 5 to 10 mg for now as SBP was staying > 170.  - cont PRN Hydralazine as well.     COPD (chronic obstructive pulmonary disease) (H -stable- cont Dulera and PRN Albuterol  Hypoxia at night? - she states that he primary provider has prescribed O2 at night because her O2 levels drop- she has never had a sleep study - will check overnight pulse oximetry- if she truly does drop at night, she will need a formal sleep study    Anxiety - cont Xanax   Time spent in minutes: 30 DVT prophylaxis: SCDs Code Status: Full code Family Communication: husband Disposition Plan: OR today- f/u on overnight pulse oximetry Consultants:   ortho Procedures:   none Antimicrobials:  Anti-infectives (From admission, onward)   Start     Dose/Rate Route Frequency Ordered Stop   01/01/19 1200  ceFAZolin (ANCEF) IVPB 2g/100 mL premix  Status:  Discontinued     2 g 200 mL/hr over 30 Minutes Intravenous On call to O.R. 01/01/19 1153 01/01/19 1203       Objective: Vitals:   01/02/19 0550 01/02/19 0552 01/02/19 0820 01/02/19 0940  BP: (!) 183/158 (!) 156/106  (!) 161/71  Pulse: 84 (!)  50  81  Resp:      Temp: 99.4 F (37.4 C)   98.7 F (37.1 C)  TempSrc: Oral   Oral  SpO2: (!) 88% (!) 88% 95% 97%  Weight:        Intake/Output Summary (Last 24 hours) at 01/02/2019 1058 Last data filed at 01/02/2019 0550 Gross per 24 hour  Intake 946.11 ml  Output 700 ml  Net 246.11 ml   Filed Weights   01/01/19 0800  Weight: 48.2 kg    Examination: General exam: Appears comfortable  HEENT: PERRLA, oral mucosa moist, no sclera icterus or thrush Respiratory system: Clear to auscultation. Respiratory effort normal. Cardiovascular system: S1 & S2 heard,  No murmurs  Gastrointestinal system: Abdomen soft, non-tender, nondistended. Normal bowel sound. No organomegaly Central nervous system: Alert and oriented. No focal neurological deficits. Extremities: No cyanosis, clubbing or edema Skin: No rashes or ulcers Psychiatry:  Mood & affect appropriate.     Data Reviewed: I have personally reviewed following labs and imaging studies  CBC: Recent Labs  Lab 12/31/18 2007  WBC 5.5  NEUTROABS 2.4  HGB 12.3  HCT 39.3  MCV 92.7  PLT 198   Basic Metabolic Panel: Recent Labs  Lab 12/31/18 2007  NA 136  K 4.1  CL 100  CO2 27  GLUCOSE 111*  BUN 15  CREATININE 1.00  CALCIUM 9.1   GFR: Estimated Creatinine Clearance: 30.1  mL/min (by C-G formula based on SCr of 1 mg/dL). Liver Function Tests: No results for input(s): AST, ALT, ALKPHOS, BILITOT, PROT, ALBUMIN in the last 168 hours. No results for input(s): LIPASE, AMYLASE in the last 168 hours. No results for input(s): AMMONIA in the last 168 hours. Coagulation Profile: Recent Labs  Lab 12/31/18 2007  INR 0.89   Cardiac Enzymes: No results for input(s): CKTOTAL, CKMB, CKMBINDEX, TROPONINI in the last 168 hours. BNP (last 3 results) No results for input(s): PROBNP in the last 8760 hours. HbA1C: No results for input(s): HGBA1C in the last 72 hours. CBG: No results for input(s): GLUCAP in the last 168  hours. Lipid Profile: No results for input(s): CHOL, HDL, LDLCALC, TRIG, CHOLHDL, LDLDIRECT in the last 72 hours. Thyroid Function Tests: No results for input(s): TSH, T4TOTAL, FREET4, T3FREE, THYROIDAB in the last 72 hours. Anemia Panel: No results for input(s): VITAMINB12, FOLATE, FERRITIN, TIBC, IRON, RETICCTPCT in the last 72 hours. Urine analysis:    Component Value Date/Time   COLORURINE COLORLESS (A) 06/27/2018 0415   APPEARANCEUR CLEAR 06/27/2018 0415   LABSPEC 1.006 06/27/2018 0415   PHURINE 9.0 (H) 06/27/2018 0415   GLUCOSEU NEGATIVE 06/27/2018 0415   HGBUR NEGATIVE 06/27/2018 0415   BILIRUBINUR NEGATIVE 06/27/2018 0415   KETONESUR 5 (A) 06/27/2018 0415   PROTEINUR NEGATIVE 06/27/2018 0415   NITRITE NEGATIVE 06/27/2018 0415   LEUKOCYTESUR NEGATIVE 06/27/2018 0415   Sepsis Labs: @LABRCNTIP (procalcitonin:4,lacticidven:4) ) Recent Results (from the past 240 hour(s))  Surgical pcr screen     Status: None   Collection Time: 01/01/19  3:01 AM  Result Value Ref Range Status   MRSA, PCR NEGATIVE NEGATIVE Final   Staphylococcus aureus NEGATIVE NEGATIVE Final    Comment: (NOTE) The Xpert SA Assay (FDA approved for NASAL specimens in patients 71 years of age and older), is one component of a comprehensive surveillance program. It is not intended to diagnose infection nor to guide or monitor treatment. Performed at [facility], [address] [place], [place] [address]          Radiology Studies: Dg Chest 1 View  Result Date: 12/31/2018 CLINICAL DATA:  85 year old female status post fall with proximal right femur fracture. EXAM: CHEST  1 VIEW COMPARISON:  Chest radiographs 06/27/2018 and earlier. FINDINGS: AP supine view at 2029 hours. Stable tortuosity of the thoracic aorta with calcified plaque. Other mediastinal contours are within normal limits. Visualized tracheal air column is within normal limits. Allowing for portable technique the lungs are  clear. Osteopenia. Negative visible bowel gas patter. IMPRESSION: No acute cardiopulmonary abnormality. Electronically Signed   By: Odessa Fleming M.D.   On: 12/31/2018 21:17   Dg Hip Unilat With Pelvis 2-3 Views Right  Result Date: 12/31/2018 CLINICAL DATA:  85 year old female status post fall with right hip pain. EXAM: DG HIP (WITH OR WITHOUT PELVIS) 2-3V RIGHT COMPARISON:  Right hip series 04/14/2018. FINDINGS: Proximal right femur fracture through the right femoral neck with varus impaction, mild anterior displacement also. The right femoral head remains normally located. The proximal right femur intertrochanteric segment appears intact. Chronic right inferior pubic ramus fracture. The pelvis appears stable and intact. Grossly intact proximal left femur. Calcified femoral artery atherosclerosis. Negative visible bowel gas pattern. IMPRESSION: Acute right femoral neck fracture with anterior displacement and varus impaction. Electronically Signed   By: Odessa Fleming M.D.   On: 12/31/2018 21:18      Scheduled Meds: . ALPRAZolam  0.5 mg Oral TID  . amLODipine  10 mg Oral Daily  . metoprolol tartrate  25 mg Oral BID  . mometasone-formoterol  2 puff Inhalation BID  . ondansetron (ZOFRAN) IV  4 mg Intravenous Once  . povidone-iodine  2 application Topical Once   Continuous Infusions: . sodium chloride 75 mL/hr at 01/02/19 0657  . methocarbamol (ROBAXIN) IV       LOS: 2 days      Calvert Cantor, MD [facility] Pager: [email or web address] Password [email or web address] 01/02/2019, 10:58 AM

## 2019-01-02 NOTE — Anesthesia Postprocedure Evaluation (Signed)
Anesthesia Post Note  Patient: [name] [name]  Procedure(s) Performed: ARTHROPLASTY BIPOLAR HIP (HEMIARTHROPLASTY) (Right Hip)     Patient location during evaluation: PACU Anesthesia Type: MAC Level of consciousness: awake and alert Pain management: pain level controlled Vital Signs Assessment: post-procedure vital signs reviewed and stable Respiratory status: spontaneous breathing and respiratory function stable Cardiovascular status: blood pressure returned to baseline and stable Postop Assessment: spinal receding Anesthetic complications: no    Last Vitals:  Vitals:   01/02/19 1708 01/02/19 1811  BP: (!) 162/113 (!) 177/71  Pulse: 84 76  Resp: 16 16  Temp: 36.6 C 37 C  SpO2: 94% 97%    Last Pain:  Vitals:   01/02/19 1811  TempSrc: Oral  PainSc:                  Tiajuana Amass

## 2019-01-02 NOTE — Progress Notes (Signed)
Patient ID: [name] [name], female   DOB: 09/05/1932, 85 y.o.   MRN: [id number]   Resting this am No events since seen last evening  AFVSS Labs reviewed  Right displaced femoral neck fracture  NPO Consent ordered To OR today for right hip hemiarthroplasty

## 2019-01-02 NOTE — Progress Notes (Signed)
CSW consult- possible SNF placement   Patient scheduled to have surgery today. CSW will continue to follow this patient.   Vivi Barrack, [place], MSW Clinical Social Worker  [phone] 01/02/2019  9:51 AM

## 2019-01-02 NOTE — Transfer of Care (Signed)
Immediate Anesthesia Transfer of Care Note  Patient: [name] [name]  Procedure(s) Performed: ARTHROPLASTY BIPOLAR HIP (HEMIARTHROPLASTY) (Right Hip)  Patient Location: PACU  Anesthesia Type:Spinal  Level of Consciousness: awake and alert   Airway & Oxygen Therapy: Patient Spontanous Breathing and Patient connected to face mask oxygen  Post-op Assessment: Report given to RN and Post -op Vital signs reviewed and stable  Post vital signs: Reviewed and stable  Last Vitals:  Vitals Value Taken Time  BP 166/108 01/02/2019  3:19 PM  Temp    Pulse    Resp 19 01/02/2019  3:21 PM  SpO2    Vitals shown include unvalidated device data.  Last Pain:  Vitals:   01/02/19 1220  TempSrc: Oral  PainSc:       Patients Stated Pain Goal: 4 (01/01/19 2119)  Complications: required introp Neo gtt-- EBL 100cc- olin aware-slight bruising right jaw from AW support

## 2019-01-02 NOTE — Interval H&P Note (Signed)
History and Physical Interval Note:  01/02/2019 12:27 PM  [name] [name]  has presented today for surgery, with the diagnosis of FRACTURE R HIP  The various methods of treatment have been discussed with the patient and family. After consideration of risks, benefits and other options for treatment, the patient has consented to  Procedure(s): ARTHROPLASTY BIPOLAR HIP (HEMIARTHROPLASTY) (Right) as a surgical intervention .  The patient's history has been reviewed, patient examined, no change in status, stable for surgery.  I have reviewed the patient's chart and labs.  Questions were answered to the patient's satisfaction.     Shelda Pal

## 2019-01-03 ENCOUNTER — Encounter (HOSPITAL_COMMUNITY): Payer: Self-pay | Admitting: Orthopedic Surgery

## 2019-01-03 DIAGNOSIS — Z8739 Personal history of other diseases of the musculoskeletal system and connective tissue: Secondary | ICD-10-CM

## 2019-01-03 DIAGNOSIS — R5082 Postprocedural fever: Secondary | ICD-10-CM | POA: Diagnosis not present

## 2019-01-03 DIAGNOSIS — J449 Chronic obstructive pulmonary disease, unspecified: Secondary | ICD-10-CM

## 2019-01-03 DIAGNOSIS — G4734 Idiopathic sleep related nonobstructive alveolar hypoventilation: Secondary | ICD-10-CM

## 2019-01-03 DIAGNOSIS — S72001A Fracture of unspecified part of neck of right femur, initial encounter for closed fracture: Principal | ICD-10-CM

## 2019-01-03 DIAGNOSIS — I10 Essential (primary) hypertension: Secondary | ICD-10-CM

## 2019-01-03 DIAGNOSIS — E876 Hypokalemia: Secondary | ICD-10-CM | POA: Diagnosis present

## 2019-01-03 DIAGNOSIS — F419 Anxiety disorder, unspecified: Secondary | ICD-10-CM

## 2019-01-03 LAB — CBC
HCT: 30.9 % — ABNORMAL LOW (ref 36.0–46.0)
Hemoglobin: 9.6 g/dL — ABNORMAL LOW (ref 12.0–15.0)
MCH: 30.1 pg (ref 26.0–34.0)
MCHC: 31.1 g/dL (ref 30.0–36.0)
MCV: 96.9 fL (ref 80.0–100.0)
Platelets: 122 10*3/uL — ABNORMAL LOW (ref 150–400)
RBC: 3.19 MIL/uL — ABNORMAL LOW (ref 3.87–5.11)
RDW: 13.7 % (ref 11.5–15.5)
WBC: 5.7 10*3/uL (ref 4.0–10.5)
nRBC: 0 % (ref 0.0–0.2)

## 2019-01-03 LAB — BASIC METABOLIC PANEL
Anion gap: 10 (ref 5–15)
BUN: 13 mg/dL (ref 8–23)
CO2: 21 mmol/L — ABNORMAL LOW (ref 22–32)
CREATININE: 0.82 mg/dL (ref 0.44–1.00)
Calcium: 7.7 mg/dL — ABNORMAL LOW (ref 8.9–10.3)
Chloride: 102 mmol/L (ref 98–111)
GFR calc Af Amer: >60 mL/min (ref >60)
GFR calc non Af Amer: >60 mL/min (ref >60)
Glucose, Bld: 110 mg/dL — ABNORMAL HIGH (ref 70–99)
Potassium: 3.2 mmol/L — ABNORMAL LOW (ref 3.5–5.1)
Sodium: 133 mmol/L — ABNORMAL LOW (ref 135–145)

## 2019-01-03 MED ORDER — POTASSIUM CHLORIDE 20 MEQ PO PACK
40.0000 meq | PACK | ORAL | Status: AC
  Administered 2019-01-03 (×2): 40 meq via ORAL
  Filled 2019-01-03 (×2): qty 2

## 2019-01-03 MED ORDER — ACETAMINOPHEN 325 MG PO TABS
650.0000 mg | ORAL_TABLET | Freq: Four times a day (QID) | ORAL | Status: DC | PRN
  Administered 2019-01-03: 650 mg via ORAL

## 2019-01-03 NOTE — Care Management Note (Signed)
Case Management Note  Patient Details  Name: [name] [name] MRN: [id number] Date of Birth: Dec 24, 1931  Subjective/Objective:                  ARTHROPLASTY BIPOLAR HIP (HEMIARTHROPLASTY) (Right)   Action/Plan: snf at [facility]  Expected Discharge Date:                  Expected Discharge Plan:  Skilled Nursing Facility  In-House Referral:  Clinical Social Work  Discharge planning Services  CM Consult  Post Acute Care Choice:    Choice offered to:     DME Arranged:    DME Agency:     HH Arranged:    HH Agency:     Status of Service:  Completed, signed off  If discussed at [employer], dates discussed:    Additional Comments:  Golda Acre, RN 01/03/2019, 10:25 AM

## 2019-01-03 NOTE — Progress Notes (Signed)
PROGRESS NOTE  [name] [name] ZOX:[id number] DOB: Aug 05, 1932 DOA: 12/31/2018 PCP: Richmond Campbell., PA-C   LOS: 3 days   Brief Narrative / Interim history: 85 year old female with history of arthritis status post bilateral knee replacement, anxiety, COPD and hypertension, now presenting with severe right hip pain and deformity after ground-level mechanical fall at home and found to have closed right hip fracture.  She is status post right hip hemiarthroplasty on 2/18  Subjective: No major events overnight.  Had right hip hemiarthroplasty yesterday.  Denies chest pain or dyspnea.  Denies nausea, vomiting or abdominal pain.  Pain fairly controlled.  Assessment & Plan: Principal Problem:   Closed right hip fracture, initial encounter [facility]) Active Problems:   Essential hypertension   COPD (chronic obstructive pulmonary disease) (HCC)   Anxiety  Mechanical fall with subsequent closed right hip fracture: Status post right hip hemiarthroplasty on 2/18 -Pain management per orthopedic surgery. -Encourage incentive spirometry -Aspirin 325 mg twice daily for VT prophylaxis -Bowel regimen. -Discontinue IV fluids  Essential hypertension: Normotensive. -Continue home amlodipine. -Continue holding home ramipril.  Chronic COPD: Stable.  On Symbicort and albuterol at home. -Scheduled and PRN DuoNeb -Dulera twice daily  Nocturnal hypoxemia: No formal diagnosis of OSA.  Reports nocturnal oxygen prescribed by primary care doctor -Continue nightly oxygen  Rheumatoid arthritis: Stable. -We will resume home Arava.  Anxiety: Stable. -Continue home Xanax.  Postop fever in immunosuppressed patient: spiked fever to 102.8 overnight.  She has no leukocytosis.  Possible atelectasis versus pneumonia. -We will continue monitoring -We will obtain chest x-ray if she spikes again. -Low threshold for initiating antibiotic.  Hypokalemia: Likely due to IV fluid. -Replenish and recheck.  Normocytic  anemia: Hemoglobin trended from 12.3-9.6.  Likely a combination of postop and blood draws. -Continue monitoring  Scheduled Meds: . ALPRAZolam  0.5 mg Oral TID  . amLODipine  10 mg Oral Daily  . aspirin EC  325 mg Oral BID  . docusate sodium  100 mg Oral BID  . ferrous sulfate  325 mg Oral TID PC  . metoprolol tartrate  25 mg Oral BID  . mometasone-formoterol  2 puff Inhalation BID  . potassium chloride  40 mEq Oral Q4H   Continuous Infusions: . sodium chloride 75 mL/hr at 01/02/19 1700  . lactated ringers    . methocarbamol (ROBAXIN) IV     PRN Meds:.acetaminophen, albuterol, bisacodyl, fentaNYL (SUBLIMAZE) injection, hydrALAZINE, menthol-cetylpyridinium **OR** phenol, methocarbamol (ROBAXIN) IV **OR** methocarbamol, metoCLOPramide **OR** metoCLOPramide (REGLAN) injection, ondansetron **OR** ondansetron (ZOFRAN) IV, oxyCODONE, senna-docusate  DVT prophylaxis: High-dose aspirin twice daily Code Status: Full code Family Communication: None at bedside Disposition Plan: Remains inpatient.  Final disposition SNF  Consultants:   Orthopedic surgery  Procedures:   Right hip hemiarthroplasty on 2/18  Antimicrobials:  Perioperative antibiotics.  Objective: Vitals:   01/03/19 0500 01/03/19 0619 01/03/19 1013 01/03/19 1202  BP:  127/61 (!) 129/59   Pulse:  72 63   Resp:  15 16   Temp: (!) 101 F (38.3 C) 98 F (36.7 C) 98.3 F (36.8 C)   TempSrc: Oral Oral Oral   SpO2:  92% 98% 97%  Weight:      Height:        Intake/Output Summary (Last 24 hours) at 01/03/2019 1316 Last data filed at 01/03/2019 0930 Gross per 24 hour  Intake 2927.5 ml  Output 935 ml  Net 1992.5 ml   Filed Weights   01/01/19 0800 01/02/19 1935  Weight: 48.2 kg 48.2 kg  Examination:  GENERAL: Appears well. No acute distress.  EYES - vision grossly intact. Sclera anicteric.  NOSE- no gross deformity or drainage MOUTH - no oral lesions noted THROAT- no swelling or erythema LUNGS:  No IWOB.   Fair air movement bilaterally.  Rhonchi bilaterally.  About 1000 on incentive spirometry. HEART:  RRR. Heart sounds normal. ABD: Bowel sounds present. Soft. Non tender.  MSK/EXT: Moves extremities. No obvious deformity. SKIN: no apparent skin lesion.  NEURO: Awake, alert and oriented appropriately.  No gross deficit.  PSYCH: Calm. Normal affect.    Data Reviewed: I have independently reviewed following labs and imaging studies  CBC: Recent Labs  Lab 12/31/18 2007 01/03/19 0528  WBC 5.5 5.7  NEUTROABS 2.4  --   HGB 12.3 9.6*  HCT 39.3 30.9*  MCV 92.7 96.9  PLT 198 122*   Basic Metabolic Panel: Recent Labs  Lab 12/31/18 2007 01/03/19 0528  NA 136 133*  K 4.1 3.2*  CL 100 102  CO2 27 21*  GLUCOSE 111* 110*  BUN 15 13  CREATININE 1.00 0.82  CALCIUM 9.1 7.7*   GFR: Estimated Creatinine Clearance: 36.7 mL/min (by C-G formula based on SCr of 0.82 mg/dL). Liver Function Tests: No results for input(s): AST, ALT, ALKPHOS, BILITOT, PROT, ALBUMIN in the last 168 hours. No results for input(s): LIPASE, AMYLASE in the last 168 hours. No results for input(s): AMMONIA in the last 168 hours. Coagulation Profile: Recent Labs  Lab 12/31/18 2007  INR 0.89   Cardiac Enzymes: No results for input(s): CKTOTAL, CKMB, CKMBINDEX, TROPONINI in the last 168 hours. BNP (last 3 results) No results for input(s): PROBNP in the last 8760 hours. HbA1C: No results for input(s): HGBA1C in the last 72 hours. CBG: No results for input(s): GLUCAP in the last 168 hours. Lipid Profile: No results for input(s): CHOL, HDL, LDLCALC, TRIG, CHOLHDL, LDLDIRECT in the last 72 hours. Thyroid Function Tests: No results for input(s): TSH, T4TOTAL, FREET4, T3FREE, THYROIDAB in the last 72 hours. Anemia Panel: No results for input(s): VITAMINB12, FOLATE, FERRITIN, TIBC, IRON, RETICCTPCT in the last 72 hours. Urine analysis:    Component Value Date/Time   COLORURINE COLORLESS (A) 06/27/2018 0415    APPEARANCEUR CLEAR 06/27/2018 0415   LABSPEC 1.006 06/27/2018 0415   PHURINE 9.0 (H) 06/27/2018 0415   GLUCOSEU NEGATIVE 06/27/2018 0415   HGBUR NEGATIVE 06/27/2018 0415   BILIRUBINUR NEGATIVE 06/27/2018 0415   KETONESUR 5 (A) 06/27/2018 0415   PROTEINUR NEGATIVE 06/27/2018 0415   NITRITE NEGATIVE 06/27/2018 0415   LEUKOCYTESUR NEGATIVE 06/27/2018 0415   Sepsis Labs: Invalid input(s): PROCALCITONIN, LACTICIDVEN  Recent Results (from the past 240 hour(s))  Surgical pcr screen     Status: None   Collection Time: 01/01/19  3:01 AM  Result Value Ref Range Status   MRSA, PCR NEGATIVE NEGATIVE Final   Staphylococcus aureus NEGATIVE NEGATIVE Final    Comment: (NOTE) The Xpert SA Assay (FDA approved for NASAL specimens in patients 83 years of age and older), is one component of a comprehensive surveillance program. It is not intended to diagnose infection nor to guide or monitor treatment. Performed at [facility], [address] [place], [place] [address]       Radiology Studies: Pelvis Portable  Result Date: 01/02/2019 CLINICAL DATA:  Right hip replacement surgery. EXAM: PORTABLE PELVIS 1-2 VIEWS COMPARISON:  Radiographs 12/31/2018 FINDINGS: Right bipolar hip prosthesis in good position without complicating features. The femoral component is well seated. The femoral head is normally  located. IMPRESSION: Well seated bipolar right hip prosthesis without complicating features. Electronically Signed   By: Rudie Meyer M.D.   On: 01/02/2019 16:05      Jaquarius Seder T. [facility] Pager [phone]  If 7PM-7AM, please contact night-coverage [email or web address] Password [email or web address] 01/03/2019, 1:16 PM

## 2019-01-03 NOTE — Progress Notes (Signed)
Physical Therapy Treatment Patient Details Name: [name] [name] MRN: [id number] DOB: [birth year] Today's Date: 01/03/2019    History of Present Illness Pt s/p fall with R hip fx and s/p R hemi-arthroplasty.  Pt wtih hx of RA, COPD, bil TKR and L humerus fx    PT Comments    Pt continues pleasant and attempting to be cooperative but with increased lethargy this pm and with increased  difficulty following commands.   Follow Up Recommendations  SNF     Equipment Recommendations  None recommended by PT    Recommendations for Other Services OT consult     Precautions / Restrictions Precautions Precautions: Fall Restrictions Weight Bearing Restrictions: No RLE Weight Bearing: Weight bearing as tolerated    Mobility  Bed Mobility Overal bed mobility: Needs Assistance Bed Mobility: Sit to Supine;Rolling Rolling: Max assist;+2 for physical assistance;+2 for safety/equipment   Supine to sit: Mod assist;Max assist;+2 for physical assistance;+2 for safety/equipment Sit to supine: Total assist;+2 for physical assistance;+2 for safety/equipment   General bed mobility comments: cues for sequence but pt able to assist minimally  Transfers Overall transfer level: Needs assistance Equipment used: Rolling walker (2 wheeled) Transfers: Sit to/from [employer] Sit to Stand: Mod assist;Max assist;+2 physical assistance;+2 safety/equipment Stand pivot transfers: Mod assist;+2 physical assistance;From elevated surface;+2 safety/equipment       General transfer comment: cues for LE management and use of UEs to self assist.  Physical assist to bring wt up and fwd and to balance in initial standing.  Pt unable to shuffle feet to stand pvt with RW - returned to sitting, repositioned and stand pvt with assist to bed  Ambulation/Gait             General Gait Details: Stand pvt only   Stairs             Wheelchair Mobility    Modified Rankin (Stroke Patients  Only)       Balance Overall balance assessment: Needs assistance Sitting-balance support: Single extremity supported;Feet supported Sitting balance-Leahy Scale: Poor     Standing balance support: Bilateral upper extremity supported Standing balance-Leahy Scale: Poor                              Cognition Arousal/Alertness: Lethargic;Suspect due to medications Behavior During Therapy: [facility] for tasks assessed/performed Overall Cognitive Status: Within Functional Limits for tasks assessed                                 General Comments: Pt with increased difficulty following commands and answering questions vs am session      Exercises Total Joint Exercises Ankle Circles/Pumps: AROM;Both;10 reps;Supine Heel Slides: AAROM;5 reps;Supine;Right    General Comments        Pertinent Vitals/Pain Pain Assessment: Faces Faces Pain Scale: Hurts little more Pain Location: R hip Pain Descriptors / Indicators: Sore Pain Intervention(s): Limited activity within patient's tolerance;Monitored during session;Premedicated before session;Ice applied    Home Living Family/patient expects to be discharged to:: Private residence Living Arrangements: Spouse/significant other;Children Available Help at Discharge: Family Type of Home: House Home Access: Stairs to enter Entrance Stairs-Rails: None Home Layout: One level Home Equipment: Environmental consultant - 4 wheels      Prior Function Level of Independence: Independent with assistive device(s)      Comments: household and short distanced community ambulator with Rollator   PT  Goals (current goals can now be found in the care plan section) Acute Rehab PT Goals Patient Stated Goal: No goals expressed PT Goal Formulation: With patient Time For Goal Achievement: 01/10/19 Potential to Achieve Goals: Fair Progress towards PT goals: Progressing toward goals    Frequency    Min 3X/week      PT Plan Current plan remains  appropriate    Co-evaluation              AM-PAC PT "6 Clicks" Mobility   Outcome Measure  Help needed turning from your back to your side while in a flat bed without using bedrails?: A Lot Help needed moving from lying on your back to sitting on the side of a flat bed without using bedrails?: A Lot Help needed moving to and from a bed to a chair (including a wheelchair)?: A Lot Help needed standing up from a chair using your arms (e.g., wheelchair or bedside chair)?: A Lot Help needed to walk in hospital room?: Total Help needed climbing 3-5 steps with a railing? : Total 6 Click Score: 10    End of Session Equipment Utilized During Treatment: Gait belt Activity Tolerance: Patient limited by fatigue;Patient limited by lethargy Patient left: in bed;with call bell/phone within reach;with bed alarm set Nurse Communication: Mobility status PT Visit Diagnosis: Difficulty in walking, not elsewhere classified (R26.2)     Time: 3790-2409 PT Time Calculation (min) (ACUTE ONLY): 22 min  Charges:  $Therapeutic Activity: 8-22 mins                     Mauro Kaufmann PT Acute Rehabilitation Services Pager [phone] Office [phone]    Kenya Kook 01/03/2019, 3:19 PM

## 2019-01-03 NOTE — Progress Notes (Signed)
   01/03/19 0256  MEWS Assessment  Is this an acute change? Yes  MEWS guidelines implemented *See Row Information* Yellow  Provider Notification  Date Provider Notified 01/03/19  Time Provider Notified [phone]  Notification Type Call  Response See new orders  Pt has temp of 102.23F, On call MD was paged and orders received. Will monitor closely.

## 2019-01-03 NOTE — [place] FL2 (Signed)
[place] MEDICAID FL2 LEVEL OF CARE SCREENING TOOL     IDENTIFICATION  Patient Name: [name] [name] Birthdate: 10/15/1932 Sex: female Admission Date (Current Location): 12/31/2018  [facility] and [place] Number:  Producer, television/film/video and Address:  [facility],  [address] [place] [address]      Provider Number: [id number]  Attending Physician Name and Address:  Almon Hercules, MD  Relative Name and Phone Number:       Current Level of Care: Hospital Recommended Level of Care: Skilled Nursing Facility Prior Approval Number:    Date Approved/Denied:   PASRR Number: Pending  Discharge Plan: Home    Current Diagnoses: Patient Active Problem List   Diagnosis Date Noted  . Postoperative fever 01/03/2019  . Hypokalemia 01/03/2019  . History of rheumatoid arthritis 01/03/2019  . Closed right hip fracture, initial encounter (HCC) 12/31/2018  . Essential hypertension 06/27/2018  . COPD (chronic obstructive pulmonary disease) (HCC) 06/27/2018  . Anxiety 06/27/2018  . Headache 06/27/2018    Orientation RESPIRATION BLADDER Height & Weight     Self, Place, Situation  Normal Continent Weight: 106 lb 4.2 oz (48.2 kg) Height:  5' (152.4 cm)  BEHAVIORAL SYMPTOMS/MOOD NEUROLOGICAL BOWEL NUTRITION STATUS      Continent Diet(Regular )  AMBULATORY STATUS COMMUNICATION OF NEEDS Skin   Extensive Assist Verbally Surgical wounds                       Personal Care Assistance Level of Assistance  Bathing, Feeding, Dressing Bathing Assistance: Maximum assistance Feeding assistance: Limited assistance Dressing Assistance: Limited assistance     Functional Limitations Info  Sight, Speech, Hearing Sight Info: Adequate Hearing Info: Adequate Speech Info: Adequate    SPECIAL CARE FACTORS FREQUENCY  PT (By licensed PT), OT (By licensed OT)     PT Frequency: 5x/week OT Frequency: 5x/week            Contractures Contractures Info: Not present     Additional Factors Info  Code Status, Allergies, Psychotropic Code Status Info: Fullcode Allergies Info: Allergies: Penicillins, Cortisone, Morphine And Related, Motrin Ibuprofen, Sulfa Antibiotics, Vicodin Hydrocodone-acetaminophen Psychotropic Info: Xanax         Current Medications (01/03/2019):  This is the current hospital active medication list Current Facility-Administered Medications  Medication Dose Route Frequency Provider Last Rate Last Dose  . acetaminophen (TYLENOL) tablet 650 mg  650 mg Oral Q6H PRN Durene Romans, MD   650 mg at 01/03/19 0320  . albuterol (PROVENTIL) (2.5 MG/3ML) 0.083% nebulizer solution 3 mL  3 mL Inhalation Q6H PRN [name], PA-C      . ALPRAZolam Prudy Feeler) tablet 0.5 mg  0.5 mg Oral TID [name], PA-C   0.5 mg at 01/03/19 0834  . amLODipine (NORVASC) tablet 10 mg  10 mg Oral Daily [name], PA-C   10 mg at 01/03/19 0932  . aspirin EC tablet 325 mg  325 mg Oral BID [name], PA-C   325 mg at 01/03/19 3557  . bisacodyl (DULCOLAX) EC tablet 5 mg  5 mg Oral Daily PRN Babish, Matthew, PA-C      . docusate sodium (COLACE) capsule 100 mg  100 mg Oral BID [name], PA-C   100 mg at 01/03/19 0835  . fentaNYL (SUBLIMAZE) injection 25-50 mcg  25-50 mcg Intravenous Q2H PRN [name], PA-C   50 mcg at 01/01/19 1034  . ferrous sulfate tablet 325 mg  325 mg Oral TID PC Babish,  Molli Hazard, PA-C   325 mg at 01/03/19 9417  . hydrALAZINE (APRESOLINE) injection 10 mg  10 mg Intravenous Q4H PRN Babish, Matthew, PA-C      . lactated ringers infusion   Intravenous Continuous Babish, Matthew, PA-C      . menthol-cetylpyridinium (CEPACOL) lozenge 3 mg  1 lozenge Oral PRN [name], PA-C       Or  . phenol (CHLORASEPTIC) mouth spray 1 spray  1 spray Mouth/Throat PRN [name], PA-C      . methocarbamol (ROBAXIN) 500 mg in dextrose 5 % 50 mL IVPB  500 mg Intravenous Q8H PRN [name], PA-C       Or  . methocarbamol  (ROBAXIN) tablet 500 mg  500 mg Oral Q8H PRN [name], PA-C   500 mg at 01/01/19 2108  . metoCLOPramide (REGLAN) tablet 5-10 mg  5-10 mg Oral Q8H PRN [name], PA-C       Or  . metoCLOPramide (REGLAN) injection 5-10 mg  5-10 mg Intravenous Q8H PRN [name], PA-C      . metoprolol tartrate (LOPRESSOR) tablet 25 mg  25 mg Oral BID [name], PA-C   25 mg at 01/03/19 0835  . mometasone-formoterol (DULERA) 200-5 MCG/ACT inhaler 2 puff  2 puff Inhalation BID [name], PA-C   2 puff at 01/03/19 1201  . ondansetron (ZOFRAN) tablet 4 mg  4 mg Oral Q6H PRN [name], PA-C       Or  . ondansetron [facility]) injection 4 mg  4 mg Intravenous Q6H PRN [name], PA-C      . oxyCODONE (Oxy IR/ROXICODONE) immediate release tablet 5-10 mg  5-10 mg Oral Q4H PRN [name], PA-C   5 mg at 01/03/19 0834  . potassium chloride (KLOR-CON) packet 40 mEq  40 mEq Oral Q4H Candelaria Stagers T, MD   40 mEq at 01/03/19 1247  . senna-docusate (Senokot-S) tablet 1 tablet  1 tablet Oral QHS PRN [name], PA-C   1 tablet at 01/01/19 2108     Discharge Medications: Please see discharge summary for a list of discharge medications.  Relevant Imaging Results:  Relevant Lab Results:   Additional Information ssn:[id number]  Clearance Coots, LCSW

## 2019-01-03 NOTE — Progress Notes (Signed)
CSW met with the patient at bedside to discuss rehab placement. Patient requested CSW talk with her daughter [name]. CSW reached out to patient daughter 2x to discuss discharge planning to SNF rehab. CSW waiting for daughter to return call.    Kathrin Greathouse, Marlinda Mike, MSW Clinical Social Worker  [phone] 01/03/2019  10:40 AM

## 2019-01-03 NOTE — Care Management Important Message (Signed)
Important Message  Patient Details  Name: [name] [name] MRN: [id number] Date of Birth: June 09, 1932   Medicare Important Message Given:  Yes    [name] 01/03/2019, 9:56 AMImportant Message  Patient Details  Name: [name] [name] MRN: [id number] Date of Birth: [birth year]   Medicare Important Message Given:  Yes    [name] 01/03/2019, 9:56 AM

## 2019-01-03 NOTE — Plan of Care (Signed)
  Problem: Clinical Measurements: °Goal: Diagnostic test results will improve °Outcome: Progressing °  °Problem: Coping: °Goal: Level of anxiety will decrease °Outcome: Progressing °  °Problem: Pain Managment: °Goal: General experience of comfort will improve °Outcome: Progressing °  °Problem: Skin Integrity: °Goal: Risk for impaired skin integrity will decrease °Outcome: Progressing °  °

## 2019-01-03 NOTE — Evaluation (Addendum)
Physical Therapy Evaluation Patient Details Name: [name] [name] MRN: [id number] DOB: [birth year] Today's Date: 01/03/2019   History of Present Illness  Pt s/p fall with R hip fx and s/p R hemi-arthroplasty.  Pt wtih hx of RA, COPD, bil TKR and L humerus fx  Clinical Impression  Pt s/p R hip fx and presents with functional mobility limitations 2* generalized weakness, decreased strength/ROM R LE, posterior THP and post op pain.  Pt would benefit from follow up rehab at SNF level to maximize IND and safety prior to return home.    Follow Up Recommendations SNF    Equipment Recommendations  None recommended by PT    Recommendations for Other Services OT consult     Precautions / Restrictions Precautions Precautions: Fall Restrictions Weight Bearing Restrictions: No RLE Weight Bearing: Weight bearing as tolerated      Mobility  Bed Mobility Overal bed mobility: Needs Assistance Bed Mobility: Supine to Sit     Supine to sit: Mod assist;Max assist;+2 for physical assistance;+2 for safety/equipment     General bed mobility comments: cues for sequence but extensive use of pad to bring pt to EOB and to sit up  Transfers Overall transfer level: Needs assistance Equipment used: Rolling walker (2 wheeled) Transfers: Sit to/from Stand Sit to Stand: Mod assist;Max assist;+2 physical assistance;+2 safety/equipment Stand pivot transfers: Mod assist;+2 physical assistance;From elevated surface;+2 safety/equipment       General transfer comment: cues for LE management and use of UEs to self assist.  Physical assist to bring wt up and fwd and to balance in initial standing  Ambulation/Gait             General Gait Details: Stand pvt only  Stairs            Wheelchair Mobility    Modified Rankin (Stroke Patients Only)       Balance Overall balance assessment: Needs assistance Sitting-balance support: Single extremity supported;Feet supported Sitting  balance-Leahy Scale: Poor     Standing balance support: Bilateral upper extremity supported Standing balance-Leahy Scale: Poor                               Pertinent Vitals/Pain Pain Assessment: Faces Faces Pain Scale: Hurts little more Pain Location: R hip Pain Descriptors / Indicators: Sore Pain Intervention(s): Limited activity within patient's tolerance;Monitored during session;Premedicated before session    Home Living Family/patient expects to be discharged to:: Private residence Living Arrangements: Spouse/significant other;Children Available Help at Discharge: Family Type of Home: House Home Access: Stairs to enter Entrance Stairs-Rails: None Secretary/administrator of Steps: 2 Home Layout: One level Home Equipment: Environmental consultant - 4 wheels      Prior Function Level of Independence: Independent with assistive device(s)         Comments: household and short distanced community ambulator with Rollator     Hand Dominance        Extremity/Trunk Assessment   Upper Extremity Assessment Upper Extremity Assessment: Generalized weakness    Lower Extremity Assessment Lower Extremity Assessment: Generalized weakness; RLE deficits/detail    Cervical / Trunk Assessment Cervical / Trunk Assessment: Kyphotic  Communication   Communication: No difficulties  Cognition Arousal/Alertness: Lethargic;Suspect due to medications Behavior During Therapy: [facility] for tasks assessed/performed Overall Cognitive Status: Within Functional Limits for tasks assessed  General Comments: Pt following commands and answering questions but falling asleep if not kept engaged      General Comments      Exercises Total Joint Exercises Ankle Circles/Pumps: AROM;Both;10 reps;Supine Heel Slides: AAROM;Right;5 reps;Supine   Assessment/Plan    PT Assessment Patient needs continued PT services  PT Problem List Decreased  strength;Decreased range of motion;Decreased activity tolerance;Decreased mobility;Pain;Decreased knowledge of use of DME;Decreased balance       PT Treatment Interventions DME instruction;Gait training;Functional mobility training;Therapeutic activities;Therapeutic exercise;Patient/family education    PT Goals (Current goals can be found in the Care Plan section)  Acute Rehab PT Goals Patient Stated Goal: No goals expressed PT Goal Formulation: With patient Time For Goal Achievement: 01/10/19 Potential to Achieve Goals: Fair    Frequency Min 3X/week   Barriers to discharge        Co-evaluation               AM-PAC PT "6 Clicks" Mobility  Outcome Measure Help needed turning from your back to your side while in a flat bed without using bedrails?: A Lot Help needed moving from lying on your back to sitting on the side of a flat bed without using bedrails?: A Lot Help needed moving to and from a bed to a chair (including a wheelchair)?: A Lot Help needed standing up from a chair using your arms (e.g., wheelchair or bedside chair)?: A Lot Help needed to walk in hospital room?: Total Help needed climbing 3-5 steps with a railing? : Total 6 Click Score: 10    End of Session Equipment Utilized During Treatment: Gait belt Activity Tolerance: Patient limited by fatigue;Patient limited by lethargy Patient left: in chair;with call bell/phone within reach;with chair alarm set Nurse Communication: Mobility status PT Visit Diagnosis: Difficulty in walking, not elsewhere classified (R26.2)    Time: 6948-5462 PT Time Calculation (min) (ACUTE ONLY): 28 min   Charges:   PT Evaluation $PT Eval Low Complexity: 1 Low PT Treatments $Therapeutic Activity: 8-22 mins        Mauro Kaufmann PT Acute Rehabilitation Services Pager [phone] Office [phone]   Wister Hoefle 01/03/2019, 12:43 PM

## 2019-01-03 NOTE — Progress Notes (Addendum)
     Subjective: 1 Day Post-Op Procedure(s) (LRB): ARTHROPLASTY BIPOLAR HIP (HEMIARTHROPLASTY) (Right)   Patient reports pain as moderate, controlled with medications.  No reported events throughout the night. States that once she is discharged she would like to go home, has a nurse daughter a that lives close that will help.     Objective:   VITALS:   Vitals:   01/03/19 0500 01/03/19 0619  BP:  127/61  Pulse:  72  Resp:  15  Temp: (!) 101 F (38.3 C) 98 F (36.7 C)  SpO2:  92%    Dorsiflexion/Plantar flexion intact Incision: dressing C/D/I No cellulitis present Compartment soft  LABS Recent Labs    12/31/18 2007 01/03/19 0528  HGB 12.3 9.6*  HCT 39.3 30.9*  WBC 5.5 5.7  PLT 198 122*    Recent Labs    12/31/18 2007 01/03/19 0528  NA 136 133*  K 4.1 3.2*  BUN 15 13  CREATININE 1.00 0.82  GLUCOSE 111* 110*     Assessment/Plan: 1 Day Post-Op Procedure(s) (LRB): ARTHROPLASTY BIPOLAR HIP (HEMIARTHROPLASTY) (Right) Up with therapy Discharge home eventually when ready   Ortho recommendations:  ASA 325 mg bid for 4 weeks for anticoagulation, unless other medically indicated.  APAP or tramadol pain management (Rx written).  Robaxin for muscle spasms (Rx written).  MiraLax and Colace for constipation  Iron 325 mg tid for 2-3 weeks   WBAT on the right leg.  Dressing to remain in place until follow in clinic in 2 weeks.  Dressing is waterproof and may shower with it in place.  Follow up in 2 weeks at [facility]. Follow up with OLIN,Rylann Munford D in 2 weeks.  Contact information:  [facility]  [address] [place] [address] [phone]        Anastasio Auerbach. Jayko Voorhees   PAC  01/03/2019, 9:25 AM

## 2019-01-04 DIAGNOSIS — Z8739 Personal history of other diseases of the musculoskeletal system and connective tissue: Secondary | ICD-10-CM

## 2019-01-04 LAB — CBC
HCT: 30.4 % — ABNORMAL LOW (ref 36.0–46.0)
Hemoglobin: 9.3 g/dL — ABNORMAL LOW (ref 12.0–15.0)
MCH: 29.6 pg (ref 26.0–34.0)
MCHC: 30.6 g/dL (ref 30.0–36.0)
MCV: 96.8 fL (ref 80.0–100.0)
Platelets: 121 10*3/uL — ABNORMAL LOW (ref 150–400)
RBC: 3.14 MIL/uL — ABNORMAL LOW (ref 3.87–5.11)
RDW: 13.8 % (ref 11.5–15.5)
WBC: 5.7 10*3/uL (ref 4.0–10.5)
nRBC: 0 % (ref 0.0–0.2)

## 2019-01-04 LAB — BASIC METABOLIC PANEL
Anion gap: 6 (ref 5–15)
BUN: 16 mg/dL (ref 8–23)
CALCIUM: 8.2 mg/dL — AB (ref 8.9–10.3)
CO2: 23 mmol/L (ref 22–32)
Chloride: 107 mmol/L (ref 98–111)
Creatinine, Ser: 0.78 mg/dL (ref 0.44–1.00)
GFR calc Af Amer: >60 mL/min (ref >60)
GFR calc non Af Amer: >60 mL/min (ref >60)
Glucose, Bld: 122 mg/dL — ABNORMAL HIGH (ref 70–99)
Potassium: 4.3 mmol/L (ref 3.5–5.1)
Sodium: 136 mmol/L (ref 135–145)

## 2019-01-04 MED ORDER — FERROUS SULFATE 220 (44 FE) MG/5ML PO ELIX
330.0000 mg | ORAL_SOLUTION | Freq: Three times a day (TID) | ORAL | Status: DC
  Filled 2019-01-04: qty 7.5

## 2019-01-04 MED ORDER — TRAMADOL HCL 50 MG PO TABS
50.0000 mg | ORAL_TABLET | Freq: Four times a day (QID) | ORAL | Status: DC | PRN
  Administered 2019-01-06 – 2019-01-08 (×4): 50 mg via ORAL
  Filled 2019-01-04 (×4): qty 1

## 2019-01-04 MED ORDER — ACETAMINOPHEN 500 MG PO TABS
1000.0000 mg | ORAL_TABLET | Freq: Three times a day (TID) | ORAL | Status: DC
  Administered 2019-01-04 – 2019-01-07 (×6): 1000 mg via ORAL
  Filled 2019-01-04 (×8): qty 2

## 2019-01-04 MED ORDER — FERROUS SULFATE 300 (60 FE) MG/5ML PO SYRP
300.0000 mg | ORAL_SOLUTION | Freq: Three times a day (TID) | ORAL | Status: DC
  Administered 2019-01-04 – 2019-01-08 (×8): 300 mg via ORAL
  Filled 2019-01-04 (×15): qty 5

## 2019-01-04 NOTE — Progress Notes (Signed)
Physical Therapy Treatment Patient Details Name: [name] [name] MRN: [id number] DOB: 1932-08-05 Today's Date: 01/04/2019    History of Present Illness Pt s/p fall with R hip fx and s/p R hemi-arthroplasty.  Pt wtih hx of RA, COPD, bil TKR and L humerus fx    PT Comments    POD # 2 Pt more awake and alert, following all commands and aware she fell and broke her hip.  Pt progressing slowly and still requires + 2 assist.  Assisted OOB to attempt amb.  General Gait Details: + 2 side by side assist a limited distance due to weakness and difficulty weight shifting onto R LE.  Daughter following with recliner.  Pt able to amb 4 feet. Positioned in recliner and applied ICE.  Pt c/o feeling "COLD" so covered with 2 warm blankets.    Follow Up Recommendations  SNF     Equipment Recommendations  None recommended by PT    Recommendations for Other Services       Precautions / Restrictions Precautions Precautions: Fall Precaution Comments: home O2 at night per family Restrictions Weight Bearing Restrictions: No RLE Weight Bearing: Weight bearing as tolerated    Mobility  Bed Mobility Overal bed mobility: Needs Assistance Bed Mobility: Supine to Sit     Supine to sit: Max assist;+2 for physical assistance;+2 for safety/equipment     General bed mobility comments: required much assist and use of bed pad to complete pivot/scooting   Transfers Overall transfer level: Needs assistance Equipment used: Rolling walker (2 wheeled) Transfers: Sit to/from [employer] Sit to Stand: Max assist;+2 physical assistance;+2 safety/equipment Stand pivot transfers: Max assist;+2 physical assistance;+2 safety/equipment       General transfer comment: 50% VC's on proper hand placement and upright posture.  Assist to control stand to sit.   Ambulation/Gait Ambulation/Gait assistance: Max assist;+2 physical assistance;+2 safety/equipment Gait Distance (Feet): 4 Feet Assistive  device: Rolling walker (2 wheeled) Gait Pattern/deviations: Step-to pattern;Decreased step length - right;Decreased step length - left;Trunk flexed;Decreased stance time - right;Decreased weight shift to right Gait velocity: decreased    General Gait Details: + 2 side by side assist a limited distance due to weakness and difficulty weight shifting onto R LE.  Daughter following with recliner.     Stairs             Wheelchair Mobility    Modified Rankin (Stroke Patients Only)       Balance                                            Cognition Arousal/Alertness: Awake/alert Behavior During Therapy: WFL for tasks assessed/performed Overall Cognitive Status: Within Functional Limits for tasks assessed                                 General Comments: awake and aware she fell, unclear on surgery and THP so redirected.  Following all commands.       Exercises      General Comments        Pertinent Vitals/Pain Pain Assessment: Faces Faces Pain Scale: Hurts a little bit Pain Location: R hip Pain Descriptors / Indicators: Sore;Grimacing;Discomfort Pain Intervention(s): Monitored during session;Premedicated before session;Repositioned;Ice applied    Home Living  Prior Function            PT Goals (current goals can now be found in the care plan section) Progress towards PT goals: Progressing toward goals    Frequency    Min 3X/week      PT Plan Current plan remains appropriate    Co-evaluation              AM-PAC PT "6 Clicks" Mobility   Outcome Measure  Help needed turning from your back to your side while in a flat bed without using bedrails?: A Lot Help needed moving from lying on your back to sitting on the side of a flat bed without using bedrails?: A Lot Help needed moving to and from a bed to a chair (including a wheelchair)?: A Lot Help needed standing up from a chair using  your arms (e.g., wheelchair or bedside chair)?: A Lot Help needed to walk in hospital room?: Total Help needed climbing 3-5 steps with a railing? : Total 6 Click Score: 10    End of Session Equipment Utilized During Treatment: Gait belt Activity Tolerance: Patient limited by fatigue Patient left: in chair;with call bell/phone within reach;with chair alarm set Nurse Communication: Mobility status PT Visit Diagnosis: Difficulty in walking, not elsewhere classified (R26.2)     Time: 1110-1135 PT Time Calculation (min) (ACUTE ONLY): 25 min  Charges:  $Gait Training: 8-22 mins $Therapeutic Activity: 8-22 mins                     Felecia Shelling  PTA Acute  Rehabilitation Services Pager      [phone] Office      [phone]

## 2019-01-04 NOTE — Progress Notes (Signed)
PROGRESS NOTE  [name] [name] TDS:[id number] DOB: November 12, 1932 DOA: 12/31/2018 PCP: Richmond Campbell., PA-C   LOS: 4 days   Brief Narrative / Interim history: 85 year old female with history of arthritis status post bilateral knee replacement, anxiety, COPD and hypertension, now presenting with severe right hip pain and deformity after ground-level mechanical fall at home and found to have closed right hip fracture.  She is status post right hip hemiarthroplasty on 2/18  Subjective: No major events overnight of this morning.  Some concern about oversedation yesterday.  She is smaller today.  Pain well controlled.  Denies chest pain, dyspnea, abdominal pain, nausea vomiting.  Family have not decided about final disposition.  Assessment & Plan: Principal Problem:   Closed right hip fracture, initial encounter [facility]) Active Problems:   Essential hypertension   COPD (chronic obstructive pulmonary disease) (HCC)   Anxiety   Postoperative fever   Hypokalemia   History of rheumatoid arthritis  Mechanical fall with subsequent closed right hip fracture: status post right hip hemiarthroplasty on 2/18 -Pain management per orthopedic surgery. -Encourage incentive spirometry -Aspirin 325 mg twice daily for VT prophylaxis -Bowel regimen. -Discontinue Foley catheter. -OOB with physical therapy  Essential hypertension: Normotensive. -Continue home amlodipine. -Continue holding home ramipril.  Chronic COPD: Stable.  On Symbicort and albuterol at home. -Scheduled and PRN DuoNeb -Dulera twice daily  Nocturnal hypoxemia: No formal diagnosis of OSA.  On 2 L at night. -Continue nightly oxygen  Rheumatoid arthritis: Stable. -We will resume home Arava.  Anxiety: Stable. -Continue home Xanax.  Postop fever in immunosuppressed patient: Likely atelectasis. Last fever on 2/19 at 2 AM.  No leukocytosis.  -We will continue monitoring -We will obtain chest x-ray if she spikes again. -Low threshold  for initiating antibiotic.  Hypokalemia: Likely due to IV fluid.  Resolved.  Normocytic anemia: Stable after initial drop.  Likely a combination of postop and blood draws. -Continue monitoring  Thrombocytopenia: Platelet dropped from 1 98-122.  Now stable at 121.  Not on heparin or Lovenox. -Closely monitor  Scheduled Meds: . acetaminophen  1,000 mg Oral Q8H  . ALPRAZolam  0.5 mg Oral TID  . amLODipine  10 mg Oral Daily  . aspirin EC  325 mg Oral BID  . docusate sodium  100 mg Oral BID  . ferrous sulfate  300 mg Oral TID WC  . metoprolol tartrate  25 mg Oral BID  . mometasone-formoterol  2 puff Inhalation BID   Continuous Infusions: . lactated ringers    . methocarbamol (ROBAXIN) IV     PRN Meds:.acetaminophen, albuterol, bisacodyl, fentaNYL (SUBLIMAZE) injection, hydrALAZINE, menthol-cetylpyridinium **OR** phenol, methocarbamol (ROBAXIN) IV **OR** methocarbamol, metoCLOPramide **OR** metoCLOPramide (REGLAN) injection, ondansetron **OR** ondansetron (ZOFRAN) IV, senna-docusate, traMADol  DVT prophylaxis: High-dose aspirin twice daily Code Status: Full code Family Communication: Daughter at bedside. Disposition Plan: Remains inpatient.  Final disposition home or SNF.  Family discussing  Consultants:   Orthopedic surgery  Procedures:   Right hip hemiarthroplasty on 2/18  Antimicrobials:  Perioperative antibiotics.  Objective: Vitals:   01/03/19 2239 01/03/19 2308 01/04/19 0650 01/04/19 1027  BP:  110/62 128/69   Pulse:  76 85   Resp:  20 18   Temp:  100 F (37.8 C) 98.8 F (37.1 C)   TempSrc:      SpO2: 95% 98% 100% 98%  Weight:      Height:        Intake/Output Summary (Last 24 hours) at 01/04/2019 1221 Last data filed at 01/04/2019 2620 Gross  per 24 hour  Intake 900 ml  Output 900 ml  Net 0 ml   Filed Weights   01/01/19 0800 01/02/19 1935  Weight: 48.2 kg 48.2 kg    Examination: GENERAL: Appears well. No acute distress.  HEENT: MMM.  Vision and  Hearing grossly intact.  NECK: Supple.  No JVD.  LUNGS:  No IWOB.  Fair air movement bilaterally.  Mild rhonchi. HEART:  RRR. Heart sounds normal.  ABD: Bowel sounds present. Soft. Non tender.  EXT:   no edema bilaterally.  Significant muscle mass wasting in lower extremities. SKIN: no apparent skin lesion.  NEURO: Awake, alert and oriented appropriately.  No gross deficit.  PSYCH: Calm. Normal affect.  Data Reviewed: I have independently reviewed following labs and imaging studies  CBC: Recent Labs  Lab 12/31/18 2007 01/03/19 0528 01/04/19 0531  WBC 5.5 5.7 5.7  NEUTROABS 2.4  --   --   HGB 12.3 9.6* 9.3*  HCT 39.3 30.9* 30.4*  MCV 92.7 96.9 96.8  PLT 198 122* 121*   Basic Metabolic Panel: Recent Labs  Lab 12/31/18 2007 01/03/19 0528 01/04/19 0531  NA 136 133* 136  K 4.1 3.2* 4.3  CL 100 102 107  CO2 27 21* 23  GLUCOSE 111* 110* 122*  BUN 15 13 16   CREATININE 1.00 0.82 0.78  CALCIUM 9.1 7.7* 8.2*   GFR: Estimated Creatinine Clearance: 37.6 mL/min (by C-G formula based on SCr of 0.78 mg/dL). Liver Function Tests: No results for input(s): AST, ALT, ALKPHOS, BILITOT, PROT, ALBUMIN in the last 168 hours. No results for input(s): LIPASE, AMYLASE in the last 168 hours. No results for input(s): AMMONIA in the last 168 hours. Coagulation Profile: Recent Labs  Lab 12/31/18 2007  INR 0.89   Cardiac Enzymes: No results for input(s): CKTOTAL, CKMB, CKMBINDEX, TROPONINI in the last 168 hours. BNP (last 3 results) No results for input(s): PROBNP in the last 8760 hours. HbA1C: No results for input(s): HGBA1C in the last 72 hours. CBG: No results for input(s): GLUCAP in the last 168 hours. Lipid Profile: No results for input(s): CHOL, HDL, LDLCALC, TRIG, CHOLHDL, LDLDIRECT in the last 72 hours. Thyroid Function Tests: No results for input(s): TSH, T4TOTAL, FREET4, T3FREE, THYROIDAB in the last 72 hours. Anemia Panel: No results for input(s): VITAMINB12, FOLATE,  FERRITIN, TIBC, IRON, RETICCTPCT in the last 72 hours. Urine analysis:    Component Value Date/Time   COLORURINE COLORLESS (A) 06/27/2018 0415   APPEARANCEUR CLEAR 06/27/2018 0415   LABSPEC 1.006 06/27/2018 0415   PHURINE 9.0 (H) 06/27/2018 0415   GLUCOSEU NEGATIVE 06/27/2018 0415   HGBUR NEGATIVE 06/27/2018 0415   BILIRUBINUR NEGATIVE 06/27/2018 0415   KETONESUR 5 (A) 06/27/2018 0415   PROTEINUR NEGATIVE 06/27/2018 0415   NITRITE NEGATIVE 06/27/2018 0415   LEUKOCYTESUR NEGATIVE 06/27/2018 0415   Sepsis Labs: Invalid input(s): PROCALCITONIN, LACTICIDVEN  Recent Results (from the past 240 hour(s))  Surgical pcr screen     Status: None   Collection Time: 01/01/19  3:01 AM  Result Value Ref Range Status   MRSA, PCR NEGATIVE NEGATIVE Final   Staphylococcus aureus NEGATIVE NEGATIVE Final    Comment: (NOTE) The Xpert SA Assay (FDA approved for NASAL specimens in patients 83 years of age and older), is one component of a comprehensive surveillance program. It is not intended to diagnose infection nor to guide or monitor treatment. Performed at [facility], [address] [place], [place] [address]       Radiology Studies:  No results found.    Jimmie Rueter T. [facility] Pager [phone]  If 7PM-7AM, please contact night-coverage [email or web address] Password [email or web address] 01/04/2019, 12:21 PM

## 2019-01-04 NOTE — Progress Notes (Signed)
Patient ID: [name] [name], female   DOB: [birth year], 85 y.o.   MRN: [id number] Subjective: 2 Days Post-Op Procedure(s) (LRB): ARTHROPLASTY BIPOLAR HIP (HEMIARTHROPLASTY) (Right)    Patient reports pain as mild. Daughter ([facility] RN) at bedside reporting very limited activity yesterday related to over sedation, confusion related to medication.  "More alert today"  Objective:   VITALS:   Vitals:   01/03/19 2308 01/04/19 0650  BP: 110/62 128/69  Pulse: 76 85  Resp: 20 18  Temp: 100 F (37.8 C) 98.8 F (37.1 C)  SpO2: 98% 100%    Neurovascular intact Incision: dressing C/D/I  LABS Recent Labs    01/03/19 0528 01/04/19 0531  HGB 9.6* 9.3*  HCT 30.9* 30.4*  WBC 5.7 5.7  PLT 122* 121*    Recent Labs    01/03/19 0528 01/04/19 0531  NA 133* 136  K 3.2* 4.3  BUN 13 16  CREATININE 0.82 0.78  GLUCOSE 110* 122*    No results for input(s): LABPT, INR in the last 72 hours.   Assessment/Plan: 2 Days Post-Op Procedure(s) (LRB): ARTHROPLASTY BIPOLAR HIP (HEMIARTHROPLASTY) (Right)   Advance diet Up with therapy   At this point I think she has not had fair assessment of her capabilities due to meds Have d/c'd oxycodone, prescribed tramadol and tylenol for pain Family would like to have her go home if we can improve on her capabilities today Anticipate 2-3 days of in hospital therapy to assist with safe home discharge

## 2019-01-04 NOTE — Clinical Social Work Note (Signed)
Clinical Social Work Assessment  Patient Details  Name: [name] [name] MRN: [id number] Date of Birth: Apr 05, 1932  Date of referral:  01/04/19               Reason for consult:  Discharge Planning, Facility Placement                Permission sought to share information with:  Family Supports Permission granted to share information::  Yes, Verbal Permission Granted  Name::     [name]   Agency::  SNF  Relationship::  Daughter   Contact Information:  [phone]  Housing/Transportation Living arrangements for the past 2 months:  Skilled Nursing Facility Source of Information:  Adult Children Patient Interpreter Needed:  None Criminal Activity/Legal Involvement Pertinent to Current Situation/Hospitalization:  No - Comment as needed Significant Relationships:  Adult Children, Spouse Lives with:  Spouse, Adult Children Do you feel safe going back to the place where you live?  Yes Need for family participation in patient care:  Yes (Comment)  Care giving concerns:   Fall with right hip pain.  PT recommends SNF  Social Worker assessment / plan: CSW discussed discharge planning with the patient daughter Arline Asp. Arline Asp reports she is agreeable to SNF placement, preferable [facility] if space is available. Patient daughter reports prior to the patient falling at home she was fairly active and independent. Patient live with her spouse.  CSW reached out to [facility] at [facility] and confirm bed.   CSW submitted for insurance authorization. authorization expires on 2/23.  FL2 complete.  Patient will need PASRR  Plan: SNF placement   Employment status:  Retired Database administrator PT Recommendations:  Skilled Nursing Facility Information / Referral to community resources:  Skilled Nursing Facility  Patient/Family's Response to care: Agreeable and Responding well to care.   Patient/Family's Understanding of and Emotional Response to Diagnosis, Current  Treatment, and Prognosis: Patient daughter is very involved in the patient care and has a good understanding of her diagnosis and currently treatment.   Emotional Assessment Appearance:  Appears stated age Attitude/Demeanor/Rapport:    Affect (typically observed):  Accepting Orientation:  Oriented to Self, Oriented to Place, Oriented to Situation Alcohol / Substance use:  Not Applicable Psych involvement (Current and /or in the community):  No (Comment)  Discharge Needs  Concerns to be addressed:  Discharge Planning Concerns Readmission within the last 30 days:  No Current discharge risk:  Dependent with Mobility Barriers to Discharge:  Awaiting State Approval ([place]), Continued Medical Work up   [employer], LCSW 01/04/2019, 9:53 AM

## 2019-01-05 LAB — CBC
HCT: 28.2 % — ABNORMAL LOW (ref 36.0–46.0)
Hemoglobin: 8.5 g/dL — ABNORMAL LOW (ref 12.0–15.0)
MCH: 29.5 pg (ref 26.0–34.0)
MCHC: 30.1 g/dL (ref 30.0–36.0)
MCV: 97.9 fL (ref 80.0–100.0)
Platelets: 120 10*3/uL — ABNORMAL LOW (ref 150–400)
RBC: 2.88 MIL/uL — ABNORMAL LOW (ref 3.87–5.11)
RDW: 13.9 % (ref 11.5–15.5)
WBC: 5.4 10*3/uL (ref 4.0–10.5)
nRBC: 0 % (ref 0.0–0.2)

## 2019-01-05 LAB — BASIC METABOLIC PANEL
Anion gap: 8 (ref 5–15)
BUN: 16 mg/dL (ref 8–23)
CALCIUM: 8.1 mg/dL — AB (ref 8.9–10.3)
CO2: 24 mmol/L (ref 22–32)
Chloride: 105 mmol/L (ref 98–111)
Creatinine, Ser: 0.76 mg/dL (ref 0.44–1.00)
GFR calc Af Amer: >60 mL/min (ref >60)
GFR calc non Af Amer: >60 mL/min (ref >60)
Glucose, Bld: 93 mg/dL (ref 70–99)
Potassium: 3.9 mmol/L (ref 3.5–5.1)
Sodium: 137 mmol/L (ref 135–145)

## 2019-01-05 MED ORDER — METHOCARBAMOL 500 MG PO TABS: 500.0000 mg | ORAL_TABLET | Freq: Four times a day (QID) | ORAL | 0 refills | Status: DC | PRN

## 2019-01-05 MED ORDER — ENSURE ENLIVE PO LIQD
237.0000 mL | Freq: Two times a day (BID) | ORAL | Status: DC
  Administered 2019-01-05 – 2019-01-08 (×5): 237 mL via ORAL

## 2019-01-05 MED ORDER — TRAMADOL HCL 50 MG PO TABS: 50.0000 mg | ORAL_TABLET | Freq: Four times a day (QID) | ORAL | 0 refills | Status: DC | PRN

## 2019-01-05 MED ORDER — ASPIRIN EC 325 MG PO TBEC: 325.0000 mg | DELAYED_RELEASE_TABLET | Freq: Two times a day (BID) | ORAL | 0 refills | Status: DC

## 2019-01-05 NOTE — Progress Notes (Addendum)
PROGRESS NOTE  [name] [name] R Aliano ONG:[id number] DOB: 04/05/1932 DOA: 12/31/2018 PCP: Richmond CampbellKaplan, Kristen W., PA-C   LOS: 5 days   Brief Narrative / Interim history: 85 year old female with history of arthritis status post bilateral knee replacement, anxiety, COPD and hypertension, now presenting with severe right hip pain and deformity after ground-level mechanical fall at home and found to have closed right hip fracture.  She is status post right hip hemiarthroplasty on 2/18  Subjective: No major events overnight of this morning.  Some concern about oversedation yesterday.  She is smaller today.  Pain well controlled.  Denies chest pain, dyspnea, abdominal pain, nausea vomiting.  Family have not decided about final disposition.  Assessment & Plan: Principal Problem:   Closed right hip fracture, initial encounter [facility](HCC) Active Problems:   Essential hypertension   COPD (chronic obstructive pulmonary disease) (HCC)   Anxiety   Postoperative fever   Hypokalemia   History of rheumatoid arthritis  Mechanical fall with subsequent closed right hip fracture: status post right hip hemiarthroplasty on 2/18 -Pain management per orthopedic surgery. -Encourage incentive spirometry -Aspirin 325 mg twice daily for VT prophylaxis -Bowel regimen. -OOB with physical therapy -Final disposition SNF  Essential hypertension: Normotensive. -Continue home amlodipine. -Continue holding home ramipril.  Chronic COPD: Stable.  On Symbicort and albuterol at home. -Scheduled and PRN DuoNeb -Dulera twice daily  Nocturnal hypoxemia: No formal diagnosis of OSA.  On 2 L at night. -Continue nightly oxygen  Rheumatoid arthritis: Stable. -Not on medication.  Off Arava for long time  Anxiety: Stable. -Continue home Xanax.  Postop fever in immunosuppressed patient: Likely atelectasis. Last fever on 2/19 at 5 AM.  No leukocytosis.   Hypokalemia: Likely due to IV fluid.  Resolved.  Normocytic anemia: Likely a  combination of postop and blood draws.  No history of CAD -Continue monitoring  Thrombocytopenia: Platelets stable after initial drop from 190s to 120s.  Not on heparin or Lovenox, unlikely HIT. -Closely monitor   Mild to moderate malnutrition: BMI 20.7.  Significant muscle mass wasting. -Recheck weight -Consult to dietitian.   Scheduled Meds: . acetaminophen  1,000 mg Oral Q8H  . ALPRAZolam  0.5 mg Oral TID  . amLODipine  10 mg Oral Daily  . aspirin EC  325 mg Oral BID  . docusate sodium  100 mg Oral BID  . ferrous sulfate  300 mg Oral TID WC  . metoprolol tartrate  25 mg Oral BID  . mometasone-formoterol  2 puff Inhalation BID   Continuous Infusions: . methocarbamol (ROBAXIN) IV     PRN Meds:.acetaminophen, albuterol, bisacodyl, fentaNYL (SUBLIMAZE) injection, hydrALAZINE, menthol-cetylpyridinium **OR** phenol, methocarbamol (ROBAXIN) IV **OR** methocarbamol, metoCLOPramide **OR** metoCLOPramide (REGLAN) injection, ondansetron **OR** ondansetron (ZOFRAN) IV, senna-docusate, traMADol  DVT prophylaxis: High-dose aspirin twice daily Code Status: Full code Family Communication: Daughter at bedside. Disposition Plan: Remains inpatient pending insurance authorization for SNF.   Consultants:   Orthopedic surgery  Procedures:   Right hip hemiarthroplasty on 2/18  Antimicrobials:  Perioperative antibiotics.  Objective: Vitals:   01/04/19 1954 01/04/19 2028 01/05/19 0457 01/05/19 0929  BP: 122/62  (!) 148/86   Pulse: 72  77   Resp: 16  17   Temp: 98.3 F (36.8 C)  98 F (36.7 C)   TempSrc: Oral  Oral   SpO2: 91% 91% 98% 98%  Weight:      Height:        Intake/Output Summary (Last 24 hours) at 01/05/2019 1243 Last data filed at 01/05/2019 1000 Gross per 24  hour  Intake 780 ml  Output 1075 ml  Net -295 ml   Filed Weights   01/01/19 0800 01/02/19 1935  Weight: 48.2 kg 48.2 kg    Examination:  GENERAL: Appears well. No acute distress.  HEENT: MMM.  Vision and  Hearing grossly intact.  NECK: Supple.  No JVD.  LUNGS:  No IWOB. Good air movement.  Mild rhonchi bilaterally. HEART:  RRR. Heart sounds normal.  ABD: Bowel sounds present. Soft. Non tender.  EXT:   no edema bilaterally.  Significant muscle mass wasting. SKIN: no apparent skin lesion.  NEURO: Awake, alert and oriented appropriately.  No gross deficit.  PSYCH: Calm. Normal affect. Data Reviewed: I have independently reviewed following labs and imaging studies  CBC: Recent Labs  Lab 12/31/18 2007 01/03/19 0528 01/04/19 0531 01/05/19 0528  WBC 5.5 5.7 5.7 5.4  NEUTROABS 2.4  --   --   --   HGB 12.3 9.6* 9.3* 8.5*  HCT 39.3 30.9* 30.4* 28.2*  MCV 92.7 96.9 96.8 97.9  PLT 198 122* 121* 120*   Basic Metabolic Panel: Recent Labs  Lab 12/31/18 2007 01/03/19 0528 01/04/19 0531 01/05/19 0528  NA 136 133* 136 137  K 4.1 3.2* 4.3 3.9  CL 100 102 107 105  CO2 27 21* 23 24  GLUCOSE 111* 110* 122* 93  BUN 15 13 16 16   CREATININE 1.00 0.82 0.78 0.76  CALCIUM 9.1 7.7* 8.2* 8.1*   GFR: Estimated Creatinine Clearance: 37.6 mL/min (by C-G formula based on SCr of 0.76 mg/dL). Liver Function Tests: No results for input(s): AST, ALT, ALKPHOS, BILITOT, PROT, ALBUMIN in the last 168 hours. No results for input(s): LIPASE, AMYLASE in the last 168 hours. No results for input(s): AMMONIA in the last 168 hours. Coagulation Profile: Recent Labs  Lab 12/31/18 2007  INR 0.89   Cardiac Enzymes: No results for input(s): CKTOTAL, CKMB, CKMBINDEX, TROPONINI in the last 168 hours. BNP (last 3 results) No results for input(s): PROBNP in the last 8760 hours. HbA1C: No results for input(s): HGBA1C in the last 72 hours. CBG: No results for input(s): GLUCAP in the last 168 hours. Lipid Profile: No results for input(s): CHOL, HDL, LDLCALC, TRIG, CHOLHDL, LDLDIRECT in the last 72 hours. Thyroid Function Tests: No results for input(s): TSH, T4TOTAL, FREET4, T3FREE, THYROIDAB in the last 72  hours. Anemia Panel: No results for input(s): VITAMINB12, FOLATE, FERRITIN, TIBC, IRON, RETICCTPCT in the last 72 hours. Urine analysis:    Component Value Date/Time   COLORURINE COLORLESS (A) 06/27/2018 0415   APPEARANCEUR CLEAR 06/27/2018 0415   LABSPEC 1.006 06/27/2018 0415   PHURINE 9.0 (H) 06/27/2018 0415   GLUCOSEU NEGATIVE 06/27/2018 0415   HGBUR NEGATIVE 06/27/2018 0415   BILIRUBINUR NEGATIVE 06/27/2018 0415   KETONESUR 5 (A) 06/27/2018 0415   PROTEINUR NEGATIVE 06/27/2018 0415   NITRITE NEGATIVE 06/27/2018 0415   LEUKOCYTESUR NEGATIVE 06/27/2018 0415   Sepsis Labs: Invalid input(s): PROCALCITONIN, LACTICIDVEN  Recent Results (from the past 240 hour(s))  Surgical pcr screen     Status: None   Collection Time: 01/01/19  3:01 AM  Result Value Ref Range Status   MRSA, PCR NEGATIVE NEGATIVE Final   Staphylococcus aureus NEGATIVE NEGATIVE Final    Comment: (NOTE) The Xpert SA Assay (FDA approved for NASAL specimens in patients 41 years of age and older), is one component of a comprehensive surveillance program. It is not intended to diagnose infection nor to guide or monitor treatment. Performed at [facility], [address] [place], [place] [address]       Radiology Studies: No results found.    Joyce Leckey [facility] Pager [phone]  If 7PM-7AM, please contact night-coverage [email or web address] Password [facility] 01/05/2019, 12:43 PM

## 2019-01-05 NOTE — Progress Notes (Signed)
Patient ID: [name] [name], female   DOB: 1932/03/23, 85 y.o.   MRN: [id number] Subjective: 3 Days Post-Op Procedure(s) (LRB): ARTHROPLASTY BIPOLAR HIP (HEMIARTHROPLASTY) (Right)    Patient reports pain as mild to moderate.  No events but still limited mobility with assistance with PT  Objective:   VITALS:   Vitals:   01/04/19 2028 01/05/19 0457  BP:  (!) 148/86  Pulse:  77  Resp:  17  Temp:  98 F (36.7 C)  SpO2: 91% 98%    Neurovascular intact Incision: dressing C/D/I  LABS Recent Labs    01/03/19 0528 01/04/19 0531 01/05/19 0528  HGB 9.6* 9.3* 8.5*  HCT 30.9* 30.4* 28.2*  WBC 5.7 5.7 5.4  PLT 122* 121* 120*    Recent Labs    01/03/19 0528 01/04/19 0531 01/05/19 0528  NA 133* 136 137  K 3.2* 4.3 3.9  BUN 13 16 16   CREATININE 0.82 0.78 0.76  GLUCOSE 110* 122* 93    No results for input(s): LABPT, INR in the last 72 hours.   Assessment/Plan: 3 Days Post-Op Procedure(s) (LRB): ARTHROPLASTY BIPOLAR HIP (HEMIARTHROPLASTY) (Right)   Up with therapy  At this point not safe for home D/C  Will need ST SNF [facility] RLE RTC in 2 weeks

## 2019-01-05 NOTE — Progress Notes (Signed)
Physical Therapy Treatment Patient Details Name: [name] [name] MRN: [id number] DOB: 1932/07/19 Today's Date: 01/05/2019    History of Present Illness Pt s/p fall with R hip fx and s/p R hemi-arthroplasty.  Pt wtih hx of RA, COPD, bil TKR and L humerus fx    PT Comments    POD # 3 Pt awake and alert.  Family stated "they took her off the Oxy".  [facility] General transfer comment: 50% VC's on proper hand placement and upright posture.  Assist to control stand to sit. Also avoid hip flex > 90 degrees.  Assisted with amb.  General Gait Details: very unsteady gait with poor forward flex posture and decreased WBing tolerance R LE.  HIGH FALL RISK. Pt will need ST Rehab at SNF prior to returning to home.   Follow Up Recommendations  SNF     Equipment Recommendations       Recommendations for Other Services       Precautions / Restrictions Precautions Precautions: Fall;Posterior Hip Precaution Comments: home O2 at night per family Restrictions Weight Bearing Restrictions: No RLE Weight Bearing: Weight bearing as tolerated    Mobility  Bed Mobility Overal bed mobility: Needs Assistance Bed Mobility: Supine to Sit     Supine to sit: Max assist     General bed mobility comments: required much assist and use of bed pad to complete pivot/scooting   Transfers Overall transfer level: Needs assistance Equipment used: Rolling walker (2 wheeled) Transfers: Sit to/from [employer]Stand;Stand Pivot Transfers Sit to Stand: Max assist;+2 physical assistance;+2 safety/equipment Stand pivot transfers: Max assist;+2 physical assistance;+2 safety/equipment       General transfer comment: 50% VC's on proper hand placement and upright posture.  Assist to control stand to sit. Also avoid hip flex > 90 degrees   Ambulation/Gait Ambulation/Gait assistance: Mod assist;+2 safety/equipment Gait Distance (Feet): 22 Feet Assistive device: Rolling walker (2 wheeled) Gait Pattern/deviations:  Step-to pattern;Decreased step length - right;Decreased step length - left;Trunk flexed;Decreased stance time - right;Decreased weight shift to right Gait velocity: decreased    General Gait Details: very unsteady gait with poor forward flex posture and decreased WBing tolerance R LE.  HIGH FALL RISK.   Stairs             Wheelchair Mobility    Modified Rankin (Stroke Patients Only)       Balance                                            Cognition Arousal/Alertness: Awake/alert Behavior During Therapy: WFL for tasks assessed/performed Overall Cognitive Status: Within Functional Limits for tasks assessed                                 General Comments: appears at prior level stated family      Exercises      General Comments        Pertinent Vitals/Pain Pain Assessment: Faces Faces Pain Scale: Hurts a little bit Pain Location: R hip Pain Descriptors / Indicators: Aching;Discomfort Pain Intervention(s): Monitored during session;Repositioned    Home Living                      Prior Function            PT Goals (current goals  can now be found in the care plan section) Progress towards PT goals: Progressing toward goals    Frequency    Min 3X/week      PT Plan Current plan remains appropriate    Co-evaluation              AM-PAC PT "6 Clicks" Mobility   Outcome Measure  Help needed turning from your back to your side while in a flat bed without using bedrails?: A Lot Help needed moving from lying on your back to sitting on the side of a flat bed without using bedrails?: A Lot Help needed moving to and from a bed to a chair (including a wheelchair)?: A Lot Help needed standing up from a chair using your arms (e.g., wheelchair or bedside chair)?: A Lot Help needed to walk in hospital room?: Total Help needed climbing 3-5 steps with a railing? : Total 6 Click Score: 10    End of Session  Equipment Utilized During Treatment: Gait belt Activity Tolerance: Patient limited by fatigue Patient left: in chair;with call bell/phone within reach;with chair alarm set Nurse Communication: Mobility status PT Visit Diagnosis: Difficulty in walking, not elsewhere classified (R26.2)     Time: 1412-1440 PT Time Calculation (min) (ACUTE ONLY): 28 min  Charges:  $Gait Training: 8-22 mins $Neuromuscular Re-education: 8-22 mins                     Felecia Shelling  PTA Acute  Rehabilitation Services Pager      [phone] Office      [phone]

## 2019-01-05 NOTE — Progress Notes (Signed)
Initial Nutrition Assessment  DOCUMENTATION CODES:   Not applicable  INTERVENTION:   Ensure Enlive BID (each provides 350 kcal, 20 g protein)   NUTRITION DIAGNOSIS:   Increased nutrient needs related to hip fracture as evidenced by estimated needs.  GOAL:   Patient will meet greater than or equal to 90% of their needs  MONITOR:   PO intake, Supplement acceptance, Weight trends, Labs  REASON FOR ASSESSMENT:   Consult Assessment of nutrition requirement/status  ASSESSMENT:   85 yo female, admitted for surgery s/p hip fracture. PMH significant for arthritis s/p bilateral knee replacement, anxiety, COPD, HTN.   Labs: Hgb 8.5 Meds: Colace, ferrous sulfate  Pt alert and resting in bed at time of visit. Family present.  Pt reports poor appetite, but family reports pt PO intake has been normal. Endorses some difficulty chewing - will modify diet to Soft. Wt stable - UBW 100-101 lb. Denies nausea, vomiting, diarrhea, or constipation. Agreeable to chocolate Ensure BID.  NUTRITION - FOCUSED PHYSICAL EXAM: Deferred - pt eating at time of visit. Appears adequately nourished at this time.  Diet Order:   2/16: NPO 2/17: NPO, regular diet 2/18: NPO, regular diet Diet Order            DIET SOFT Room service appropriate? Yes; Fluid consistency: Thin  Diet effective now             Variable intake, 25-100% x 4 meals recorded since 2/18  EDUCATION NEEDS:  No education needs have been identified at this time  Skin:  Skin Assessment: Skin Integrity Issues: Skin Integrity Issues:: Incisions, Other (Comment) Incisions: surgical - hip Other: ecchymosis - arm, jaw, knee  Last BM:  2/21  Height:  Ht Readings from Last 1 Encounters:  01/02/19 5' (1.524 m)    Weight:  Wt Readings from Last 10 Encounters:  01/02/19 48.2 kg  11/03/18 47.2 kg  07/05/18 45.8 kg  06/27/18 45.8 kg  05/05/18 45.8 kg  04/19/17 48.5 kg  01/27/17 49.9 kg  01/11/17 49.4 kg  Wt up since June  2019  Ideal Body Weight:  45.5 kg  BMI:  Body mass index is 20.75 kg/m. normal  Estimated Nutritional Needs: calculated based on 2/18 wt (~48 kg)  Kcal:  1440-1680 (30-35 kcal/kg)  Protein:  58-72 gm (1.2-1.5 g/kg)  Fluid:  1 mL/kcal or per MD  Jolaine Artist, MS, RDN, LDN Pager: [phone] Available Mondays and Fridays, 9am-2pm

## 2019-01-06 LAB — BASIC METABOLIC PANEL
ANION GAP: 7 (ref 5–15)
BUN: 15 mg/dL (ref 8–23)
CO2: 27 mmol/L (ref 22–32)
Calcium: 8.2 mg/dL — ABNORMAL LOW (ref 8.9–10.3)
Chloride: 105 mmol/L (ref 98–111)
Creatinine, Ser: 0.74 mg/dL (ref 0.44–1.00)
GFR calc Af Amer: >60 mL/min (ref >60)
GFR calc non Af Amer: >60 mL/min (ref >60)
Glucose, Bld: 94 mg/dL (ref 70–99)
Potassium: 3.8 mmol/L (ref 3.5–5.1)
Sodium: 139 mmol/L (ref 135–145)

## 2019-01-06 LAB — CBC
HCT: 28.1 % — ABNORMAL LOW (ref 36.0–46.0)
Hemoglobin: 8.9 g/dL — ABNORMAL LOW (ref 12.0–15.0)
MCH: 29.9 pg (ref 26.0–34.0)
MCHC: 31.7 g/dL (ref 30.0–36.0)
MCV: 94.3 fL (ref 80.0–100.0)
NRBC: 0 % (ref 0.0–0.2)
Platelets: 156 10*3/uL (ref 150–400)
RBC: 2.98 MIL/uL — ABNORMAL LOW (ref 3.87–5.11)
RDW: 13.8 % (ref 11.5–15.5)
WBC: 4.7 10*3/uL (ref 4.0–10.5)

## 2019-01-06 LAB — URINALYSIS, ROUTINE W REFLEX MICROSCOPIC
Bilirubin Urine: NEGATIVE
Glucose, UA: NEGATIVE mg/dL
Hgb urine dipstick: NEGATIVE
Ketones, ur: NEGATIVE mg/dL
LEUKOCYTE UA: NEGATIVE
NITRITE: NEGATIVE
Protein, ur: NEGATIVE mg/dL
Specific Gravity, Urine: 1.012 (ref 1.005–1.030)
pH: 7 (ref 5.0–8.0)

## 2019-01-06 MED ORDER — ALPRAZOLAM 0.5 MG PO TABS
0.5000 mg | ORAL_TABLET | Freq: Two times a day (BID) | ORAL | Status: DC
  Administered 2019-01-07 – 2019-01-08 (×3): 0.5 mg via ORAL
  Filled 2019-01-06 (×3): qty 1

## 2019-01-06 NOTE — Evaluation (Signed)
Clinical/Bedside Swallow Evaluation Patient Details  Name: [name] [name] MRN: [id number] Date of Birth: 1932/08/29  Today's Date: 01/06/2019 Time: SLP Start Time (ACUTE ONLY): 1300 SLP Stop Time (ACUTE ONLY): 1325 SLP Time Calculation (min) (ACUTE ONLY): 25 min  Past Medical History:  Past Medical History:  Diagnosis Date  . Anxiety   . Arthritis    rheumatoid  . COPD (chronic obstructive pulmonary disease) (HCC)   . Hypertension    Past Surgical History:  Past Surgical History:  Procedure Laterality Date  . APPENDECTOMY    . CATARACT EXTRACTION W/PHACO Right 11/27/2013   Procedure: CATARACT EXTRACTION PHACO AND INTRAOCULAR LENS PLACEMENT (IOC);  Surgeon: Loraine LericheMark T. Nile RiggsShapiro, MD;  Location: [facility];  Service: Ophthalmology;  Laterality: Right;  CDE:7.31  . CATARACT EXTRACTION W/PHACO Left 12/11/2013   Procedure: CATARACT EXTRACTION PHACO AND INTRAOCULAR LENS PLACEMENT (IOC);  Surgeon: Loraine LericheMark T. Nile RiggsShapiro, MD;  Location: [facility];  Service: Ophthalmology;  Laterality: Left;  CDE:7.94  . HIP ARTHROPLASTY Right 01/02/2019   Procedure: ARTHROPLASTY BIPOLAR HIP (HEMIARTHROPLASTY);  Surgeon: Durene Romanslin, Matthew, MD;  Location: [facility];  Service: Orthopedics;  Laterality: Right;  . HUMERUS FRACTURE SURGERY Left    had surgery x3 on it.  . REPLACEMENT TOTAL KNEE BILATERAL     HPI:  Patient is an 85 y.o. female with PMH: arthritis s/p bilateral knee replacement, anxiety, COPD, HTN presenting with severe right hip pain and deformity after ground level mechanical fall at home and found to have a closed right hip fracture. Patient exhibited difficulty with swallowing pills and per son, has h/o difficulty with pills.   Assessment / Plan / Recommendation Clinical Impression  Patient presents with a mild oropharyngeal dysphagia with impact from dry mouth (likely secondary to COPD and oxygen use via nasal cannula, patient reports she has oxygen at night) and ill-fitting lower dentures secondary to patient with  absence of lower gums. Patient did not exhibit any overt s/s of aspiration with puree, regular, thin liquids but did exhibit delayed oral transit and decreased mastication of regular solids.  SLP Visit Diagnosis: Dysphagia, unspecified (R13.10)    Aspiration Risk  Mild aspiration risk    Diet Recommendation Dysphagia 3 (Mech soft);Thin liquid   Liquid Administration via: Straw;Cup Medication Administration: Other (Comment)(drink water prior to pills, try bites of gelatin prior to pills) Supervision: Patient able to self feed;Intermittent supervision to cue for compensatory strategies Compensations: Slow rate;Minimize environmental distractions;Small sips/bites Postural Changes: Seated upright at 90 degrees    Other  Recommendations Oral Care Recommendations: Oral care BID   Follow up Recommendations None      Frequency and Duration min 1 x/week  1 week       Prognosis Prognosis for Safe Diet Advancement: Good      Swallow Study   General Date of Onset: 01/06/19 HPI: Patient is an 85 y.o. female with PMH: arthritis s/p bilateral knee replacement, anxiety, COPD, HTN presenting with severe right hip pain and deformity after ground level mechanical fall at home and found to have a closed right hip fracture. Patient exhibited difficulty with swallowing pills and per son, has h/o difficulty with pills. Type of Study: Bedside Swallow Evaluation Previous Swallow Assessment: N/A Diet Prior to this Study: Dysphagia 3 (soft);Thin liquids Temperature Spikes Noted: No Respiratory Status: Nasal cannula History of Recent Intubation: No Behavior/Cognition: Alert;Cooperative;Pleasant mood Oral Cavity Assessment: Within Functional Limits Oral Care Completed by SLP: Yes Oral Cavity - Dentition: Edentulous;Dentures, bottom;Dentures, top(patient with no lower gums resulting in very ill-fitting  lower dentures. Upper dentures did not appear to be loose.) Vision: Functional for  self-feeding Self-Feeding Abilities: Able to feed self Patient Positioning: Upright in bed Baseline Vocal Quality: Normal Volitional Cough: Weak Volitional Swallow: Able to elicit    Oral/Motor/Sensory Function Overall Oral Motor/Sensory Function: Within functional limits   Ice Chips Ice chips: Not tested   Thin Liquid Thin Liquid: Within functional limits Presentation: Cup;Straw Other Comments: No overt s/s of aspiration or penetration observed.     Nectar Thick Nectar Thick Liquid: Not tested   Honey Thick Honey Thick Liquid: Not tested   Puree Puree: Within functional limits Presentation: Self Fed;Spoon   Solid     Solid: Impaired Oral Phase Impairments: Impaired mastication Oral Phase Functional Implications: Prolonged oral transit Other Comments: Impaired mastication appeared resultant of ill-fitting lower dentures. Patient stated that prior to hospitalization, she could not eat meats and everything was soft, tender.     Angela Nevin, MA, CCC-SLP 01/06/19 2:38 PM

## 2019-01-06 NOTE — Progress Notes (Signed)
PROGRESS NOTE  LATERA WHILLOCK TZG:[id number] DOB: [birth year] DOA: 12/31/2018 PCP: Richmond Campbell., PA-C   LOS: 6 days   Brief Narrative / Interim history: 85 year old female with history of arthritis status post bilateral knee replacement, anxiety, COPD and hypertension, now presenting with severe right hip pain and deformity after ground-level mechanical fall at home and found to have closed right hip fracture.  She is status post right hip hemiarthroplasty on 2/18.   Subjective: Patient's daughter in the room.  Concerned about lack of sleep and confusion overnight.  Daughter also reports frequent urination.  She did not complain pain.  Coughing more than usual. RN concerned about dysphagia.  No history of dysphagia per daughter.  However, she has trouble fitting her dentures this morning.  Patient denies pain, faculty breathing, abdominal pain or urinary symptoms.  She is concerned about her husband at home.   Assessment & Plan: Principal Problem:   Closed right hip fracture, initial encounter [facility]) Active Problems:   Essential hypertension   COPD (chronic obstructive pulmonary disease) (HCC)   Anxiety   Postoperative fever   Hypokalemia   History of rheumatoid arthritis  Mechanical fall with subsequent closed right hip fracture: status post right hip hemiarthroplasty on 2/18 -Pain management per orthopedic surgery. -Encourage incentive spirometry -Aspirin 325 mg twice daily for VT prophylaxis -Bowel regimen. -OOB with physical therapy -Final disposition SNF  Delirium:  -Urinalysis urine culture to exclude UTI given increased frequency of urination. -Delirium precaution-has not been taking pain medications.  However, she is on home Xanax 0.5 mg twice daily which could contribute.  Essential hypertension: normotensive. -Continue home amlodipine. -Continue holding home ramipril.  Chronic COPD: Stable.  On Symbicort and albuterol at home. -Scheduled and PRN DuoNeb -Dulera  twice daily  Nocturnal hypoxemia: No formal diagnosis of OSA.  On 2 L at night. -Continue nightly oxygen  Rheumatoid arthritis: Stable. -Not on medication.  Off Arava for long time  Anxiety: Stable. -Continue home Xanax.  Postop fever in immunosuppressed patient: Likely atelectasis.  Resolved.  Last fever on 2/19 at 5 AM.  No leukocytosis.   Hypokalemia: Likely due to IV fluid.  Resolved.  Normocytic anemia: Stable.  Likely a combination of postop and blood draws.  No history of CAD -Continue monitoring  Thrombocytopenia: Resolving.  No risk for HIT -Closely monitor   Mild to moderate malnutrition: BMI 20.7.  Significant muscle mass wasting. -Recheck weight -Consult to dietitian.   Scheduled Meds: . acetaminophen  1,000 mg Oral Q8H  . ALPRAZolam  0.5 mg Oral TID  . amLODipine  10 mg Oral Daily  . aspirin EC  325 mg Oral BID  . docusate sodium  100 mg Oral BID  . feeding supplement (ENSURE ENLIVE)  237 mL Oral BID BM  . ferrous sulfate  300 mg Oral TID WC  . metoprolol tartrate  25 mg Oral BID  . mometasone-formoterol  2 puff Inhalation BID   Continuous Infusions: . methocarbamol (ROBAXIN) IV     PRN Meds:.acetaminophen, albuterol, bisacodyl, fentaNYL (SUBLIMAZE) injection, hydrALAZINE, menthol-cetylpyridinium **OR** phenol, methocarbamol (ROBAXIN) IV **OR** methocarbamol, metoCLOPramide **OR** metoCLOPramide (REGLAN) injection, ondansetron **OR** ondansetron (ZOFRAN) IV, senna-docusate, traMADol  DVT prophylaxis: High-dose aspirin twice daily Code Status: Full code Family Communication: Daughter at bedside. Disposition Plan: Remains inpatient pending insurance authorization for SNF.   Consultants:   Orthopedic surgery  Procedures:   Right hip hemiarthroplasty on 2/18  Antimicrobials:  Perioperative antibiotics.  Objective: Vitals:   01/05/19 2157 01/06/19 0504 01/06/19 0903 01/06/19  0918  BP: (!) 143/71 (!) 144/73  (!) 145/61  Pulse: 84 79  81  Resp: 17    16  Temp: 98.5 F (36.9 C) 98.9 F (37.2 C)    TempSrc: Oral Oral    SpO2: 100% 99% 96% 98%  Weight:      Height:        Intake/Output Summary (Last 24 hours) at 01/06/2019 1058 Last data filed at 01/06/2019 1023 Gross per 24 hour  Intake 600 ml  Output 800 ml  Net -200 ml   Filed Weights   01/01/19 0800 01/02/19 1935  Weight: 48.2 kg 48.2 kg    Examination:  GENERAL: Appears well. No acute distress.  HEENT: MMM.  Vision and Hearing grossly intact.  NECK: Supple.  No JVD.  LUNGS:  No IWOB. Good air movement.  Some rhonchi bilaterally. HEART:  RRR. Heart sounds normal.  ABD: Bowel sounds present. Soft. Non tender.  EXT:   no edema bilaterally.  Significant muscle mass wasting. SKIN: no apparent skin lesion.  NEURO: Awake, alert and oriented appropriately.  No gross deficit.  PSYCH: Calm. Normal affect.  Data Reviewed: I have independently reviewed following labs and imaging studies  CBC: Recent Labs  Lab 12/31/18 2007 01/03/19 0528 01/04/19 0531 01/05/19 0528 01/06/19 0353  WBC 5.5 5.7 5.7 5.4 4.7  NEUTROABS 2.4  --   --   --   --   HGB 12.3 9.6* 9.3* 8.5* 8.9*  HCT 39.3 30.9* 30.4* 28.2* 28.1*  MCV 92.7 96.9 96.8 97.9 94.3  PLT 198 122* 121* 120* 156   Basic Metabolic Panel: Recent Labs  Lab 12/31/18 2007 01/03/19 0528 01/04/19 0531 01/05/19 0528 01/06/19 0353  NA 136 133* 136 137 139  K 4.1 3.2* 4.3 3.9 3.8  CL 100 102 107 105 105  CO2 27 21* 23 24 27   GLUCOSE 111* 110* 122* 93 94  BUN 15 13 16 16 15   CREATININE 1.00 0.82 0.78 0.76 0.74  CALCIUM 9.1 7.7* 8.2* 8.1* 8.2*   GFR: Estimated Creatinine Clearance: 37.6 mL/min (by C-G formula based on SCr of 0.74 mg/dL). Liver Function Tests: No results for input(s): AST, ALT, ALKPHOS, BILITOT, PROT, ALBUMIN in the last 168 hours. No results for input(s): LIPASE, AMYLASE in the last 168 hours. No results for input(s): AMMONIA in the last 168 hours. Coagulation Profile: Recent Labs  Lab  12/31/18 2007  INR 0.89   Cardiac Enzymes: No results for input(s): CKTOTAL, CKMB, CKMBINDEX, TROPONINI in the last 168 hours. BNP (last 3 results) No results for input(s): PROBNP in the last 8760 hours. HbA1C: No results for input(s): HGBA1C in the last 72 hours. CBG: No results for input(s): GLUCAP in the last 168 hours. Lipid Profile: No results for input(s): CHOL, HDL, LDLCALC, TRIG, CHOLHDL, LDLDIRECT in the last 72 hours. Thyroid Function Tests: No results for input(s): TSH, T4TOTAL, FREET4, T3FREE, THYROIDAB in the last 72 hours. Anemia Panel: No results for input(s): VITAMINB12, FOLATE, FERRITIN, TIBC, IRON, RETICCTPCT in the last 72 hours. Urine analysis:    Component Value Date/Time   COLORURINE COLORLESS (A) 06/27/2018 0415   APPEARANCEUR CLEAR 06/27/2018 0415   LABSPEC 1.006 06/27/2018 0415   PHURINE 9.0 (H) 06/27/2018 0415   GLUCOSEU NEGATIVE 06/27/2018 0415   HGBUR NEGATIVE 06/27/2018 0415   BILIRUBINUR NEGATIVE 06/27/2018 0415   KETONESUR 5 (A) 06/27/2018 0415   PROTEINUR NEGATIVE 06/27/2018 0415   NITRITE NEGATIVE 06/27/2018 0415   LEUKOCYTESUR NEGATIVE 06/27/2018 0415   Sepsis Labs: Invalid  input(s): PROCALCITONIN, LACTICIDVEN  Recent Results (from the past 240 hour(s))  Surgical pcr screen     Status: None   Collection Time: 01/01/19  3:01 AM  Result Value Ref Range Status   MRSA, PCR NEGATIVE NEGATIVE Final   Staphylococcus aureus NEGATIVE NEGATIVE Final    Comment: (NOTE) The Xpert SA Assay (FDA approved for NASAL specimens in patients 53 years of age and older), is one component of a comprehensive surveillance program. It is not intended to diagnose infection nor to guide or monitor treatment. Performed at [facility], [address], [place], [place] [address]       Radiology Studies: No results found.   T. [facility] Pager [phone]  If 7PM-7AM, please contact  night-coverage [email or web address] Password [facility] 01/06/2019, 10:58 AM

## 2019-01-07 LAB — BASIC METABOLIC PANEL
Anion gap: 8 (ref 5–15)
BUN: 15 mg/dL (ref 8–23)
CO2: 28 mmol/L (ref 22–32)
CREATININE: 0.66 mg/dL (ref 0.44–1.00)
Calcium: 8.5 mg/dL — ABNORMAL LOW (ref 8.9–10.3)
Chloride: 102 mmol/L (ref 98–111)
GFR calc Af Amer: >60 mL/min (ref >60)
GFR calc non Af Amer: >60 mL/min (ref >60)
Glucose, Bld: 98 mg/dL (ref 70–99)
Potassium: 3.7 mmol/L (ref 3.5–5.1)
Sodium: 138 mmol/L (ref 135–145)

## 2019-01-07 LAB — CBC
HCT: 31.6 % — ABNORMAL LOW (ref 36.0–46.0)
Hemoglobin: 9.6 g/dL — ABNORMAL LOW (ref 12.0–15.0)
MCH: 29 pg (ref 26.0–34.0)
MCHC: 30.4 g/dL (ref 30.0–36.0)
MCV: 95.5 fL (ref 80.0–100.0)
PLATELETS: 179 10*3/uL (ref 150–400)
RBC: 3.31 MIL/uL — ABNORMAL LOW (ref 3.87–5.11)
RDW: 13.7 % (ref 11.5–15.5)
WBC: 4.6 10*3/uL (ref 4.0–10.5)
nRBC: 0 % (ref 0.0–0.2)

## 2019-01-07 NOTE — Progress Notes (Signed)
PROGRESS NOTE  Dan Europehyllis R Savas WUJ:[id number] DOB: 1932/02/20 DOA: 12/31/2018 PCP: Richmond CampbellKaplan, Kristen W., PA-C   LOS: 7 days   Brief Narrative / Interim history: 85 year old female with history of arthritis status post bilateral knee replacement, anxiety, COPD and hypertension, now presenting with severe right hip pain and deformity after ground-level mechanical fall at home and found to have closed right hip fracture.  She is status post right hip hemiarthroplasty on 2/18.   Subjective: No major events overnight of this morning.  Complains of some pain in her hip.  Denies chest pain, dyspnea, nausea, vomiting, abdominal pain or urinary symptoms.  Assessment & Plan: Principal Problem:   Closed right hip fracture, initial encounter [facility](HCC) Active Problems:   Essential hypertension   COPD (chronic obstructive pulmonary disease) (HCC)   Anxiety   Postoperative fever   Hypokalemia   History of rheumatoid arthritis  Mechanical fall with subsequent closed right hip fracture: status post right hip hemiarthroplasty on 2/18 -Pain management per orthopedic surgery. -Encourage incentive spirometry -Aspirin 325 mg twice daily for VT prophylaxis -Bowel regimen. -OOB with physical therapy -Final disposition SNF  Delirium: Resolved.  urinalysis negative.  Urine culture pending.  Denies UTI symptoms today.  Has not been taking pain medications. -Delirium precaution -Change Xanax to as needed.  Essential hypertension: Fairly controlled. -Continue home amlodipine. -Continue holding home ramipril.  Chronic COPD: Stable.  On Symbicort and albuterol at home. -Scheduled and PRN DuoNeb -Dulera twice daily  Nocturnal hypoxemia: No formal diagnosis of OSA.  On 2 L at night. -Continue nightly oxygen  Rheumatoid arthritis: Stable. -Not on medication.  Off Arava for long time  Anxiety: Stable. -Change Xanax to as needed.  Postop fever in immunosuppressed patient: Likely atelectasis.  Resolved.   Last fever on 2/19 at 5 AM.  No leukocytosis.   Hypokalemia: Likely due to IV fluid.  Resolved.  Normocytic anemia: Stable.  Likely a combination of postop and blood draws.  No history of CAD -Continue monitoring  Thrombocytopenia: Resolving.  No risk for HIT -Closely monitor   Mild to moderate malnutrition: BMI 20.7.  Significant muscle mass wasting. -Recheck weight -Consult to dietitian.   Scheduled Meds: . acetaminophen  1,000 mg Oral Q8H  . ALPRAZolam  0.5 mg Oral BID  . amLODipine  10 mg Oral Daily  . aspirin EC  325 mg Oral BID  . docusate sodium  100 mg Oral BID  . feeding supplement (ENSURE ENLIVE)  237 mL Oral BID BM  . ferrous sulfate  300 mg Oral TID WC  . metoprolol tartrate  25 mg Oral BID  . mometasone-formoterol  2 puff Inhalation BID   Continuous Infusions:  PRN Meds:.acetaminophen, albuterol, bisacodyl, hydrALAZINE, menthol-cetylpyridinium **OR** phenol, ondansetron **OR** ondansetron (ZOFRAN) IV, senna-docusate, traMADol  DVT prophylaxis: High-dose aspirin twice daily Code Status: Full code Family Communication: Daughter at bedside. Disposition Plan: Remains inpatient pending insurance authorization for SNF.   Consultants:   Orthopedic surgery  Procedures:   Right hip hemiarthroplasty on 2/18  Antimicrobials:  Perioperative antibiotics.  Objective: Vitals:   01/06/19 2014 01/06/19 2125 01/07/19 0600 01/07/19 0609  BP: (!) 163/72  (!) 163/73 132/72  Pulse: 86  85 88  Resp: 18   15  Temp: 99.1 F (37.3 C)  98.5 F (36.9 C) 98.7 F (37.1 C)  TempSrc: Oral  Oral Oral  SpO2: 100% 92% 96% 93%  Weight:      Height:        Intake/Output Summary (Last 24 hours) at  01/07/2019 0951 Last data filed at 01/07/2019 0900 Gross per 24 hour  Intake 1070 ml  Output 300 ml  Net 770 ml   Filed Weights   01/01/19 0800 01/02/19 1935  Weight: 48.2 kg 48.2 kg    Examination:  GENERAL: Appears well. No acute distress.  HEENT: MMM.  Vision and Hearing  grossly intact.  NECK: Supple.  LUNGS:  No IWOB. Good air movement. CTAB.  HEART:  RRR. Heart sounds normal.  ABD: Bowel sounds present. Soft. Non tender.  EXT:  no edema bilaterally.  Significant muscle mass wasting. SKIN: no apparent skin lesion.  NEURO: Awake, alert and oriented appropriately.  No gross deficit.  PSYCH: Calm. Normal affect.  Data Reviewed: I have independently reviewed following labs and imaging studies  CBC: Recent Labs  Lab 12/31/18 2007 01/03/19 0528 01/04/19 0531 01/05/19 0528 01/06/19 0353 01/07/19 0420  WBC 5.5 5.7 5.7 5.4 4.7 4.6  NEUTROABS 2.4  --   --   --   --   --   HGB 12.3 9.6* 9.3* 8.5* 8.9* 9.6*  HCT 39.3 30.9* 30.4* 28.2* 28.1* 31.6*  MCV 92.7 96.9 96.8 97.9 94.3 95.5  PLT 198 122* 121* 120* 156 179   Basic Metabolic Panel: Recent Labs  Lab 01/03/19 0528 01/04/19 0531 01/05/19 0528 01/06/19 0353 01/07/19 0420  NA 133* 136 137 139 138  K 3.2* 4.3 3.9 3.8 3.7  CL 102 107 105 105 102  CO2 21* 23 24 27 28   GLUCOSE 110* 122* 93 94 98  BUN 13 16 16 15 15   CREATININE 0.82 0.78 0.76 0.74 0.66  CALCIUM 7.7* 8.2* 8.1* 8.2* 8.5*   GFR: Estimated Creatinine Clearance: 37.6 mL/min (by C-G formula based on SCr of 0.66 mg/dL). Liver Function Tests: No results for input(s): AST, ALT, ALKPHOS, BILITOT, PROT, ALBUMIN in the last 168 hours. No results for input(s): LIPASE, AMYLASE in the last 168 hours. No results for input(s): AMMONIA in the last 168 hours. Coagulation Profile: Recent Labs  Lab 12/31/18 2007  INR 0.89   Cardiac Enzymes: No results for input(s): CKTOTAL, CKMB, CKMBINDEX, TROPONINI in the last 168 hours. BNP (last 3 results) No results for input(s): PROBNP in the last 8760 hours. HbA1C: No results for input(s): HGBA1C in the last 72 hours. CBG: No results for input(s): GLUCAP in the last 168 hours. Lipid Profile: No results for input(s): CHOL, HDL, LDLCALC, TRIG, CHOLHDL, LDLDIRECT in the last 72 hours. Thyroid  Function Tests: No results for input(s): TSH, T4TOTAL, FREET4, T3FREE, THYROIDAB in the last 72 hours. Anemia Panel: No results for input(s): VITAMINB12, FOLATE, FERRITIN, TIBC, IRON, RETICCTPCT in the last 72 hours. Urine analysis:    Component Value Date/Time   COLORURINE YELLOW 01/06/2019 1248   APPEARANCEUR CLEAR 01/06/2019 1248   LABSPEC 1.012 01/06/2019 1248   PHURINE 7.0 01/06/2019 1248   GLUCOSEU NEGATIVE 01/06/2019 1248   HGBUR NEGATIVE 01/06/2019 1248   BILIRUBINUR NEGATIVE 01/06/2019 1248   KETONESUR NEGATIVE 01/06/2019 1248   PROTEINUR NEGATIVE 01/06/2019 1248   NITRITE NEGATIVE 01/06/2019 1248   LEUKOCYTESUR NEGATIVE 01/06/2019 1248   Sepsis Labs: Invalid input(s): PROCALCITONIN, LACTICIDVEN  Recent Results (from the past 240 hour(s))  Surgical pcr screen     Status: None   Collection Time: 01/01/19  3:01 AM  Result Value Ref Range Status   MRSA, PCR NEGATIVE NEGATIVE Final   Staphylococcus aureus NEGATIVE NEGATIVE Final    Comment: (NOTE) The Xpert SA Assay (FDA approved for NASAL specimens in patients 22  years of age and older), is one component of a comprehensive surveillance program. It is not intended to diagnose infection nor to guide or monitor treatment. Performed at [facility], [address] [place], [place] [address]       Radiology Studies: No results found.  Yu Peggs T. [facility] Pager [phone]  If 7PM-7AM, please contact night-coverage [email or web address] Password [facility] 01/07/2019, 9:51 AM

## 2019-01-08 LAB — CBC
HCT: 29.4 % — ABNORMAL LOW (ref 36.0–46.0)
Hemoglobin: 9.3 g/dL — ABNORMAL LOW (ref 12.0–15.0)
MCH: 29.7 pg (ref 26.0–34.0)
MCHC: 31.6 g/dL (ref 30.0–36.0)
MCV: 93.9 fL (ref 80.0–100.0)
PLATELETS: 205 10*3/uL (ref 150–400)
RBC: 3.13 MIL/uL — AB (ref 3.87–5.11)
RDW: 13.8 % (ref 11.5–15.5)
WBC: 5.5 10*3/uL (ref 4.0–10.5)
nRBC: 0 % (ref 0.0–0.2)

## 2019-01-08 LAB — BASIC METABOLIC PANEL
Anion gap: 8 (ref 5–15)
BUN: 14 mg/dL (ref 8–23)
CO2: 28 mmol/L (ref 22–32)
Calcium: 8.3 mg/dL — ABNORMAL LOW (ref 8.9–10.3)
Chloride: 102 mmol/L (ref 98–111)
Creatinine, Ser: 0.76 mg/dL (ref 0.44–1.00)
GFR calc Af Amer: >60 mL/min (ref >60)
GFR calc non Af Amer: >60 mL/min (ref >60)
Glucose, Bld: 103 mg/dL — ABNORMAL HIGH (ref 70–99)
Potassium: 3.3 mmol/L — ABNORMAL LOW (ref 3.5–5.1)
Sodium: 138 mmol/L (ref 135–145)

## 2019-01-08 LAB — URINE CULTURE: Culture: NO GROWTH

## 2019-01-08 MED ORDER — METHOCARBAMOL 500 MG PO TABS: 500.0000 mg | ORAL_TABLET | Freq: Four times a day (QID) | ORAL | 0 refills | Status: DC | PRN

## 2019-01-08 MED ORDER — POTASSIUM CHLORIDE 20 MEQ PO PACK
40.0000 meq | PACK | Freq: Once | ORAL | Status: AC
  Administered 2019-01-08: 40 meq via ORAL
  Filled 2019-01-08: qty 2

## 2019-01-08 MED ORDER — SENNOSIDES-DOCUSATE SODIUM 8.6-50 MG PO TABS: 1.0000 | ORAL_TABLET | Freq: Every evening | ORAL | 0 refills | Status: AC | PRN

## 2019-01-08 MED ORDER — ENSURE ENLIVE PO LIQD: 237.0000 mL | Freq: Two times a day (BID) | ORAL | 0 refills | Status: AC

## 2019-01-08 MED ORDER — ASPIRIN EC 325 MG PO TBEC: 325.0000 mg | DELAYED_RELEASE_TABLET | Freq: Two times a day (BID) | ORAL | 0 refills | Status: AC

## 2019-01-08 MED ORDER — FERROUS SULFATE 300 (60 FE) MG/5ML PO SYRP: 300.0000 mg | ORAL_SOLUTION | Freq: Three times a day (TID) | ORAL | 3 refills | Status: DC

## 2019-01-08 MED ORDER — TRAMADOL HCL 50 MG PO TABS: 50.0000 mg | ORAL_TABLET | Freq: Four times a day (QID) | ORAL | 0 refills | Status: DC | PRN

## 2019-01-08 NOTE — Care Management Important Message (Signed)
Important Message  Patient Details  Name: [name] [name] MRN: [id number] Date of Birth: 1932-01-26   Medicare Important Message Given:  Yes    [name] 01/08/2019, 12:56 PMImportant Message  Patient Details  Name: [name] [name] MRN: [id number] Date of Birth: 1932-01-16   Medicare Important Message Given:  Yes    [name] 01/08/2019, 12:56 PM

## 2019-01-08 NOTE — Progress Notes (Signed)
Patient family prefers to take patient home as she is progressing well with physical therapy.  CSW informed SNF patient will discharge home at this time.  RNCM has arranged home [facility].   Vivi Barrack, [place], MSW Clinical Social Worker  [phone] 01/08/2019  2:04 PM

## 2019-01-08 NOTE — Discharge Summary (Signed)
Physician Discharge Summary  [name] [name] ZOX:[id number] DOB: 29-Mar-1932 DOA: 12/31/2018  PCP: Richmond CampbellKaplan, Kristen W., PA-C  Admit date: 12/31/2018 Discharge date: 01/08/2019  Admitted From: Home Disposition: Home  Recommendations for Outpatient Follow-up:  1. Follow up with orthopedic surgery in 2 weeks. 2. Please obtain BMP/CBC follow-up. 3. Please follow up on the following pending results: None  Home Health: Physical therapy Equipment/Devices: Rolling walker and bedside commode  Discharge Condition: Stable CODE STATUS: Full code  Hospital Course: 85 year old female with history of arthritis status post bilateral knee replacement, anxiety, COPD and hypertension, now presenting with severe right hip pain and deformity after ground-level mechanical fall at home and found to have closed right hip fracture.  She is status post right hip hemiarthroplasty on 2/18.  Postop course complicated by brief delirium that has resolved.  Evaluated by physical therapy who recommended SNF.  However, given improvement in her mobility and overall condition throughout her stay, family decided to take patient home.  One of her daughter is a Engineer, civil (consulting)nurse.  Home physical therapy ordered.  DME  including rolling walker and bedside commode ordered.  Discussed family's decision with Dr. Nilsa Nuttinglin's PA who is in agreement.  She would be on aspirin 325 mg twice daily for VTE prophylaxis.  Given tramadol and methocarbamol for pain.   See individual problem list below for more.  Discharge Diagnoses:  Principal Problem:   Closed right hip fracture, initial encounter [facility](HCC) Active Problems:   Essential hypertension   COPD (chronic obstructive pulmonary disease) (HCC)   Anxiety   Postoperative fever   Hypokalemia   History of rheumatoid arthritis  Mechanical fall with subsequent closed right hip fracture: status post right hip hemiarthroplasty on 2/18 -Pain management per orthopedic surgery. -Aspirin 325 mg twice daily  for VT prophylaxis -Weightbearing as tolerated. -Bowel regimen. -Follow-up with orthopedic surgery in 2 weeks. -Ongoing physical therapy at home.  Delirium: Resolved.    Likely iatrogenic.  Urinalysis and urine culture negative.  Denies UTI symptoms today.  Has not been taking pain medications.  Lung exam not impressive.  No fever or leukocytosis. -Delirium precaution  Hypokalemia: 3.3. -Replenished prior to discharge.  Essential hypertension: Fairly controlled. -Discharged on home medications.  Chronic COPD: Stable.  On Symbicort and albuterol at home. -Discharged on home medications.  Nocturnal hypoxemia: No formal diagnosis of OSA.  On 2 L at night. -Continue nightly oxygen  Rheumatoid arthritis: Stable. -Discharge on home medications.  Resume Arava 2 weeks after surgery.  Anxiety: Stable. -Change Xanax to as needed.  Postop fever in immunosuppressed patient: Likely atelectasis.  Resolved.  Last fever on 2/19 at 5 AM.  No leukocytosis.   Hypokalemia: Likely due to IV fluid.  Resolved.  Normocytic anemia: Stable.  Likely a combination of postop and blood draws.  No history of CAD -Discharged on ferrous sulfate. -Check CBC at follow-up  Thrombocytopenia: Resolving.  No risk for HIT -Closely monitor   Mild to moderate malnutrition: BMI 20.7.  Significant muscle mass wasting. -Discharged on Ensure.  Discharge Instructions  Discharge Instructions    Diet - low sodium heart healthy   Complete by:  As directed    Discharge instructions   Complete by:  As directed    It has been a pleasure taking care of you! -Weightbearing as tolerated. -Use walker all the time -Follow-up with orthopedic surgeon in 2 weeks.  Once you are discharged, your primary care physician will handle any further medical issues. Please note that NO REFILLS for any  discharge medications will be authorized once you are discharged, as it is imperative that you return to your primary care  physician (or establish a relationship with a primary care physician if you do not have one) for your aftercare needs so that they can reassess your need for medications and monitor your lab values. Take care,   Increase activity slowly   Complete by:  As directed    Weight bearing as tolerated   Complete by:  As directed    Laterality:  right   Extremity:  Lower     Allergies as of 01/08/2019      Reactions   Penicillins Swelling   Has patient had a PCN reaction causing immediate rash, facial/tongue/throat swelling, SOB or lightheadedness with hypotension: Yes Has patient had a PCN reaction causing severe rash involving mucus membranes or skin necrosis: No Has patient had a PCN reaction that required hospitalization No Has patient had a PCN reaction occurring within the last 10 years: No If all of the above answers are "NO", then may proceed with Cephalosporin use.   Cortisone Other (See Comments)   Irregular Heart Beat   Morphine And Related Nausea And Vomiting   Motrin [ibuprofen]    Sulfa Antibiotics Hives   Vicodin [hydrocodone-acetaminophen] Nausea And Vomiting      Medication List    TAKE these medications   acetaminophen 500 MG tablet Commonly known as:  TYLENOL Take 2 tablets (1,000 mg total) by mouth every 8 (eight) hours.   albuterol 108 (90 Base) MCG/ACT inhaler Commonly known as:  PROVENTIL HFA;VENTOLIN HFA Inhale 2 puffs into the lungs every 6 (six) hours as needed for wheezing or shortness of breath.   ALPRAZolam 0.5 MG tablet Commonly known as:  XANAX Take 0.5 mg by mouth 2 (two) times daily.   amLODipine 5 MG tablet Commonly known as:  NORVASC Take 1 tablet (5 mg total) by mouth daily.   aspirin EC 325 MG tablet Take 1 tablet (325 mg total) by mouth 2 (two) times daily for 30 days. Take for 4 weeks.   cholecalciferol 1000 units tablet Commonly known as:  VITAMIN D Take 1,000 Units by mouth daily.   feeding supplement (ENSURE ENLIVE) Liqd Take 237  mLs by mouth 2 (two) times daily between meals.   ferrous sulfate 300 (60 Fe) MG/5ML syrup Take 5 mLs (300 mg total) by mouth 3 (three) times daily with meals.   fluticasone 50 MCG/ACT nasal spray Commonly known as:  FLONASE Place 2 sprays into both nostrils at bedtime.   hydroxypropyl methylcellulose / hypromellose 2.5 % ophthalmic solution Commonly known as:  ISOPTO TEARS / GONIOVISC Place 1 drop into both eyes 3 (three) times daily as needed for dry eyes.   leflunomide 20 MG tablet Commonly known as:  ARAVA Take 1 tablet (20 mg total) by mouth daily. Resume 2 weeks after surgery. What changed:  additional instructions   methocarbamol 500 MG tablet Commonly known as:  ROBAXIN Take 1 tablet (500 mg total) by mouth every 6 (six) hours as needed for muscle spasms.   metoprolol tartrate 25 MG tablet Commonly known as:  LOPRESSOR Take 1 tablet (25 mg total) by mouth 2 (two) times daily.   multivitamin with minerals Tabs tablet Take 1 tablet by mouth daily.   ramipril 10 MG capsule Commonly known as:  ALTACE Take 20 mg by mouth daily.   senna-docusate 8.6-50 MG tablet Commonly known as:  Senokot-S Take 1 tablet by mouth at bedtime as needed  for mild constipation.   SYMBICORT 160-4.5 MCG/ACT inhaler Generic drug:  budesonide-formoterol Inhale 2 puffs into the lungs 2 (two) times daily.   traMADol 50 MG tablet Commonly known as:  ULTRAM Take 1-2 tablets (50-100 mg total) by mouth every 6 (six) hours as needed for moderate pain or severe pain. What changed:    how much to take  when to take this  reasons to take this            Durable Medical Equipment  (From admission, onward)         Start     Ordered   01/08/19 1042  For home use only DME 3 n 1  Once     01/08/19 1041   01/08/19 1041  For home use only DME Walker rolling  Once    Question:  Patient needs a walker to treat with the following condition  Answer:  Gait instability   01/08/19 1041             Discharge Care Instructions  (From admission, onward)         Start     Ordered   01/02/19 0000  Weight bearing as tolerated    Question Answer Comment  Laterality right   Extremity Lower      01/02/19 1321          Contact information for follow-up providers    Durene Romanslin, Matthew, MD. Schedule an appointment as soon as possible for a visit in 2 weeks.   Specialty:  Orthopedic Surgery Contact information: [address] [place] [address] [phone]            Contact information for after-discharge care    Destination    [facility] Preferred SNF .   Service:  Skilled Nursing Contact information: [address] [id number] [phone]                  Consultations:  Orthopedic surgery  Procedures/Studies:  2D Echo: None obtained this admission.  Dg Chest 1 View  Result Date: 12/31/2018 CLINICAL DATA:  85 year old female status post fall with proximal right femur fracture. EXAM: CHEST  1 VIEW COMPARISON:  Chest radiographs 06/27/2018 and earlier. FINDINGS: AP supine view at 2029 hours. Stable tortuosity of the thoracic aorta with calcified plaque. Other mediastinal contours are within normal limits. Visualized tracheal air column is within normal limits. Allowing for portable technique the lungs are clear. Osteopenia. Negative visible bowel gas patter. IMPRESSION: No acute cardiopulmonary abnormality. Electronically Signed   By: Odessa FlemingH  Hall M.D.   On: 12/31/2018 21:17   Pelvis Portable  Result Date: 01/02/2019 CLINICAL DATA:  Right hip replacement surgery. EXAM: PORTABLE PELVIS 1-2 VIEWS COMPARISON:  Radiographs 12/31/2018 FINDINGS: Right bipolar hip prosthesis in good position without complicating features. The femoral component is well seated. The femoral head is normally located. IMPRESSION: Well seated bipolar right hip prosthesis without complicating features. Electronically Signed   By: Rudie MeyerP.   Gallerani M.D.   On: 01/02/2019 16:05   Dg Hip Unilat With Pelvis 2-3 Views Right  Result Date: 12/31/2018 CLINICAL DATA:  85 year old female status post fall with right hip pain. EXAM: DG HIP (WITH OR WITHOUT PELVIS) 2-3V RIGHT COMPARISON:  Right hip series 04/14/2018. FINDINGS: Proximal right femur fracture through the right femoral neck with varus impaction, mild anterior displacement also. The right femoral head remains normally located. The proximal right femur intertrochanteric segment appears intact. Chronic right inferior pubic ramus  fracture. The pelvis appears stable and intact. Grossly intact proximal left femur. Calcified femoral artery atherosclerosis. Negative visible bowel gas pattern. IMPRESSION: Acute right femoral neck fracture with anterior displacement and varus impaction. Electronically Signed   By: Odessa Fleming M.D.   On: 12/31/2018 21:18     Subjective: No major events overnight of this morning.  Two daughters at bedside.  One of her daughters is a former Designer, multimedia.  Both patient and family feels she would do better if she goes home.  They states that they have noticed a lot of improvement during her stay here. They states that they are comfortable taking care of her at home.  Discharge Exam: Vitals:   01/08/19 0438 01/08/19 1050  BP: (!) 151/76 (!) 145/71  Pulse: 80 86  Resp:  14  Temp: 98.8 F (37.1 C) 99 F (37.2 C)  SpO2: 93% 96%    GENERAL: Appears well. No acute distress.  HEENT: MMM.  Vision and Hearing grossly intact.  NECK: Supple.  No JVD.  LUNGS:  No IWOB. Good air movement. CTAB.  HEART:  RRR. Heart sounds normal. ABD: Bowel sounds present. Soft. Non tender.  EXT:   no edema bilaterally.  SKIN: no apparent skin lesion.  NEURO: Awake, alert and oriented appropriately.  No gross deficit.  PSYCH: Calm. Normal affect.  The results of significant diagnostics from this hospitalization (including imaging, microbiology, ancillary and laboratory) are listed below  for reference.     Microbiology: Recent Results (from the past 240 hour(s))  Surgical pcr screen     Status: None   Collection Time: 01/01/19  3:01 AM  Result Value Ref Range Status   MRSA, PCR NEGATIVE NEGATIVE Final   Staphylococcus aureus NEGATIVE NEGATIVE Final    Comment: (NOTE) The Xpert SA Assay (FDA approved for NASAL specimens in patients 7 years of age and older), is one component of a comprehensive surveillance program. It is not intended to diagnose infection nor to guide or monitor treatment. Performed at [facility], [address], [place], [place] [address]   Culture, Urine     Status: None   Collection Time: 01/06/19 12:49 PM  Result Value Ref Range Status   Specimen Description   Final    URINE, CLEAN CATCH Performed at [facility], [address], [place], [place] [address]    Special Requests   Final    NONE Performed at [facility], [address], [place], [place] [address]    Culture   Final    NO GROWTH Performed at [facility], [address] [place], [place] [address]    Report Status 01/08/2019 FINAL  Final     Labs: BNP (last 3 results) No results for input(s): BNP in the last 8760 hours. Basic Metabolic Panel: Recent Labs  Lab 01/04/19 0531 01/05/19 0528 01/06/19 0353 01/07/19 0420 01/08/19 0459  NA 136 137 139 138 138  K 4.3 3.9 3.8 3.7 3.3*  CL 107 105 105 102 102  CO2 GLUCOSE 122* 93 94 98 103*  BUN CREATININE 0.78 0.76 0.74 0.66 0.76  CALCIUM 8.2* 8.1* 8.2* 8.5* 8.3*   Liver Function Tests: No results for input(s): AST, ALT, ALKPHOS, BILITOT, PROT, ALBUMIN in the last 168 hours. No results for input(s): LIPASE, AMYLASE in the last 168 hours. No results for input(s): AMMONIA in the last 168 hours. CBC: Recent Labs  Lab 01/04/19 0531 01/05/19 0528 01/06/19 0353 01/07/19 0420 01/08/19 0459  WBC 5.7 5.4 4.7 4.6 5.5  HGB  9.3* 8.5* 8.9* 9.6* 9.3*  HCT 30.4* 28.2* 28.1* 31.6* 29.4*  MCV 96.8 97.9 94.3 95.5 93.9  PLT 121* 120* 156 179 205   Cardiac Enzymes: No results for input(s): CKTOTAL, CKMB, CKMBINDEX, TROPONINI in the last 168 hours. BNP: Invalid input(s): POCBNP CBG: No results for input(s): GLUCAP in the last 168 hours. D-Dimer No results for input(s): DDIMER in the last 72 hours. Hgb A1c No results for input(s): HGBA1C in the last 72 hours. Lipid Profile No results for input(s): CHOL, HDL, LDLCALC, TRIG, CHOLHDL, LDLDIRECT in the last 72 hours. Thyroid function studies No results for input(s): TSH, T4TOTAL, T3FREE, THYROIDAB in the last 72 hours.  Invalid input(s): FREET3 Anemia work up No results for input(s): VITAMINB12, FOLATE, FERRITIN, TIBC, IRON, RETICCTPCT in the last 72 hours. Urinalysis    Component Value Date/Time   COLORURINE YELLOW 01/06/2019 1248   APPEARANCEUR CLEAR 01/06/2019 1248   LABSPEC 1.012 01/06/2019 1248   PHURINE 7.0 01/06/2019 1248   GLUCOSEU NEGATIVE 01/06/2019 1248   HGBUR NEGATIVE 01/06/2019 1248   BILIRUBINUR NEGATIVE 01/06/2019 1248   KETONESUR NEGATIVE 01/06/2019 1248   PROTEINUR NEGATIVE 01/06/2019 1248   NITRITE NEGATIVE 01/06/2019 1248   LEUKOCYTESUR NEGATIVE 01/06/2019 1248   Sepsis Labs Invalid input(s): PROCALCITONIN,  WBC,  LACTICIDVEN   Time coordinating discharge: 35 minutes  SIGNED:  Almon Hercules, MD  [facility] 01/08/2019, 12:00 PM Pager [phone]  If 7PM-7AM, please contact night-coverage [email or web address] Password [email or web address]

## 2019-01-08 NOTE — Care Management Note (Signed)
Case Management Note  Patient Details  Name: [name] [name] MRN: [id number] Date of Birth: Jun 05, 1932  Subjective/Objective:                  DISCHArged to home  Action/Plan: [facility] dme advanced 3 in 1  Expected Discharge Date:  01/08/19               Expected Discharge Plan:  Home w Home Health Services  In-House Referral:  Clinical Social Work  Discharge planning Services  CM Consult  Post Acute Care Choice:  Durable Medical Equipment, Home Health Choice offered to:  Patient  DME Arranged:  3-N-1 DME Agency:  [facility]  HH Arranged:  PT HH Agency:  [facility] (now [facility]  Status of Service:  Completed, signed off  If discussed at [employer] of Stay Meetings, dates discussed:    Additional Comments:  Golda Acre, RN 01/08/2019, 11:06 AM

## 2019-01-08 NOTE — Progress Notes (Signed)
Physical Therapy Treatment Patient Details Name: [name] [name] MRN: [id number] DOB: 1932-03-03 Today's Date: 01/08/2019    History of Present Illness Pt s/p fall with R hip fx and s/p R hemi-arthroplasty.  Pt wtih hx of RA, COPD, bil TKR and L humerus fx    PT Comments    POD # 6  Assisted OOB to amb to bathroom then in hallway.  General Gait Details: unsteady gait with poor forward flex posture and decreased WBing tolerance R LE.  HIGH FALL RISK. Advised daughter to NOT allwo pt to amb by herself.  Requires "hands on" assist esp with turns and around obsticles. Educated on THP esp with car transfer to avoid hip flex > 90 degrees.  Educated on HEP and given handout on "Surgical Hip' exercises.  Educated on sleeping positions and use of pillows/towel roll to adhere to THP.  Addressed all mobility questions, discussed appropriate activity, educated on use of ICE.  Pt ready for D/C to home.   Follow Up Recommendations  SNF(lpt rec)     Equipment Recommendations  None recommended by PT    Recommendations for Other Services       Precautions / Restrictions Precautions Precautions: Fall;Posterior Hip Precaution Comments: recalls 2/3 THP Restrictions Weight Bearing Restrictions: No RLE Weight Bearing: Weight bearing as tolerated    Mobility  Bed Mobility Overal bed mobility: Needs Assistance Bed Mobility: Supine to Sit     Supine to sit: Min assist     General bed mobility comments: increased time and assist to guide R LE and assist to complete scooting to EOB   Transfers Overall transfer level: Needs assistance Equipment used: Rolling walker (2 wheeled) Transfers: Sit to/from [employer] CorporationStand;Stand Pivot Transfers Sit to Stand: Min assist Stand pivot transfers: Min assist       General transfer comment: 50% VC's on proper hand placement and upright posture.  Assist to control stand to sit. Also avoid hip flex > 90 degrees   Ambulation/Gait Ambulation/Gait assistance: Min  assist Gait Distance (Feet): 45 Feet(11 feet x 2 to and from bathroom) Assistive device: Rolling walker (2 wheeled) Gait Pattern/deviations: Step-to pattern;Decreased step length - right;Decreased step length - left;Trunk flexed;Decreased stance time - right;Decreased weight shift to right Gait velocity: decreased    General Gait Details: unsteady gait with poor forward flex posture and decreased WBing tolerance R LE.  HIGH FALL RISK. Advised daughter to NOT allwo pt to amb by herself.  Requires "hands on" assist esp with turns and around obsticles.    Stairs             Wheelchair Mobility    Modified Rankin (Stroke Patients Only)       Balance                                            Cognition Arousal/Alertness: Awake/alert Behavior During Therapy: WFL for tasks assessed/performed Overall Cognitive Status: Within Functional Limits for tasks assessed                                 General Comments: appears at prior level stated family      Exercises      General Comments        Pertinent Vitals/Pain Pain Assessment: Faces Faces Pain Scale: Hurts little more Pain Location: R hip  Pain Descriptors / Indicators: Aching;Discomfort Pain Intervention(s): Monitored during session;Repositioned    Home Living                      Prior Function            PT Goals (current goals can now be found in the care plan section) Progress towards PT goals: Progressing toward goals    Frequency    Min 3X/week      PT Plan Other (comment)(family plans to take her home and can provide 24/7 assist)    Co-evaluation              AM-PAC PT "6 Clicks" Mobility   Outcome Measure  Help needed turning from your back to your side while in a flat bed without using bedrails?: A Little Help needed moving from lying on your back to sitting on the side of a flat bed without using bedrails?: A Little Help needed moving to and  from a bed to a chair (including a wheelchair)?: A Little Help needed standing up from a chair using your arms (e.g., wheelchair or bedside chair)?: A Little Help needed to walk in hospital room?: A Little Help needed climbing 3-5 steps with a railing? : A Lot 6 Click Score: 17    End of Session Equipment Utilized During Treatment: Gait belt Activity Tolerance: Patient tolerated treatment well Patient left: in chair;with call bell/phone within reach;with chair alarm set Nurse Communication: Mobility status PT Visit Diagnosis: Difficulty in walking, not elsewhere classified (R26.2)     Time: 2876-8115 PT Time Calculation (min) (ACUTE ONLY): 37 min  Charges:  $Gait Training: 8-22 mins $Self Care/Home Management: 8-22                     Felecia Shelling  PTA Acute  Rehabilitation Services Pager      [phone] Office      [phone]

## 2019-06-27 ENCOUNTER — Ambulatory Visit (INDEPENDENT_AMBULATORY_CARE_PROVIDER_SITE_OTHER): Payer: Medicare HMO | Admitting: Cardiovascular Disease

## 2019-06-27 ENCOUNTER — Encounter: Payer: Self-pay | Admitting: Cardiovascular Disease

## 2019-06-27 ENCOUNTER — Other Ambulatory Visit: Payer: Self-pay

## 2019-06-27 VITALS — BP 151/77 | HR 63 | Temp 96.9°F | Ht 61.0 in | Wt 100.0 lb

## 2019-06-27 DIAGNOSIS — I10 Essential (primary) hypertension: Secondary | ICD-10-CM | POA: Diagnosis not present

## 2019-06-27 DIAGNOSIS — R55 Syncope and collapse: Secondary | ICD-10-CM

## 2019-06-27 NOTE — Progress Notes (Signed)
SUBJECTIVE: The patient presents for routine follow-up.  She and her husband celebrated their 65th anniversary over Thanksgiving 2019.   The patient denies any symptoms of chest pain, palpitations, shortness of breath, lightheadedness, dizziness, leg swelling, orthopnea, PND, and syncope.  She tripped over the dishwasher door when watching TV and fractured her right hip in February 2020 and underwent right hip hemiarthroplasty.   Soc Hx: Her daughter, Arline AspCindy, is a nursewho used to [employer]on the third floor at [employer] [facility] [employer].They live with their son. They have another daughter who lives in [place]. Her husband is a retired Charity fundraiserchemist who used to own his own [employer] company in [place]. She and her husband celebrated their 65th anniversary over Thanksgiving 2019.    Review of Systems: As per "subjective", otherwise negative.  Allergies  Allergen Reactions  . Penicillins Swelling    Has patient had a PCN reaction causing immediate rash, facial/tongue/throat swelling, SOB or lightheadedness with hypotension: Yes Has patient had a PCN reaction causing severe rash involving mucus membranes or skin necrosis: No Has patient had a PCN reaction that required hospitalization No Has patient had a PCN reaction occurring within the last 10 years: No If all of the above answers are "NO", then may proceed with Cephalosporin use.   . Cortisone Other (See Comments)    Irregular Heart Beat  . Morphine And Related Nausea And Vomiting  . Motrin [Ibuprofen]   . Sulfa Antibiotics Hives  . Vicodin [Hydrocodone-Acetaminophen] Nausea And Vomiting    Current Outpatient Medications  Medication Sig Dispense Refill  . acetaminophen (TYLENOL) 500 MG tablet Take 2 tablets (1,000 mg total) by mouth every 8 (eight) hours. 30 tablet 0  . albuterol (PROVENTIL HFA;VENTOLIN HFA) 108 (90 BASE) MCG/ACT inhaler Inhale 2 puffs into the lungs every 6 (six) hours as needed for wheezing or shortness of  breath.    . ALPRAZolam (XANAX) 0.5 MG tablet Take 0.5 mg by mouth 2 (two) times daily.     Marland Kitchen. amLODipine (NORVASC) 5 MG tablet Take 1 tablet (5 mg total) by mouth daily. 30 tablet 0  . budesonide-formoterol (SYMBICORT) 160-4.5 MCG/ACT inhaler Inhale 2 puffs into the lungs 2 (two) times daily.    . cholecalciferol (VITAMIN D) 1000 UNITS tablet Take 1,000 Units by mouth daily.    . feeding supplement, ENSURE ENLIVE, (ENSURE ENLIVE) LIQD Take 237 mLs by mouth 2 (two) times daily between meals. 60 Bottle 0  . fluticasone (FLONASE) 50 MCG/ACT nasal spray Place 2 sprays into both nostrils at bedtime.     . hydroxypropyl methylcellulose / hypromellose (ISOPTO TEARS / GONIOVISC) 2.5 % ophthalmic solution Place 1 drop into both eyes 3 (three) times daily as needed for dry eyes.    Marland Kitchen. leflunomide (ARAVA) 20 MG tablet Take 1 tablet (20 mg total) by mouth daily. Resume 2 weeks after surgery.    . metoprolol tartrate (LOPRESSOR) 25 MG tablet Take 1 tablet (25 mg total) by mouth 2 (two) times daily. 60 tablet 0  . Multiple Vitamin (MULTIVITAMIN WITH MINERALS) TABS tablet Take 1 tablet by mouth daily.    . ramipril (ALTACE) 10 MG capsule Take 20 mg by mouth daily.    Marland Kitchen. senna-docusate (SENOKOT-S) 8.6-50 MG tablet Take 1 tablet by mouth at bedtime as needed for mild constipation. 30 tablet 0  . traMADol (ULTRAM) 50 MG tablet Take 1-2 tablets (50-100 mg total) by mouth every 6 (six) hours as needed for moderate pain or severe pain. 40 tablet 0  No current facility-administered medications for this visit.     Past Medical History:  Diagnosis Date  . Anxiety   . Arthritis    rheumatoid  . COPD (chronic obstructive pulmonary disease) ([place])   . Fall   . Hypertension     Past Surgical History:  Procedure Laterality Date  . APPENDECTOMY    . CATARACT EXTRACTION W/PHACO Right 11/27/2013   Procedure: CATARACT EXTRACTION PHACO AND INTRAOCULAR LENS PLACEMENT (IOC);  Surgeon: Elta Guadeloupe T. Gershon Crane, MD;  Location: [facility];  Service: Ophthalmology;  Laterality: Right;  CDE:7.31  . CATARACT EXTRACTION W/PHACO Left 12/11/2013   Procedure: CATARACT EXTRACTION PHACO AND INTRAOCULAR LENS PLACEMENT (IOC);  Surgeon: Elta Guadeloupe T. Gershon Crane, MD;  Location: [facility];  Service: Ophthalmology;  Laterality: Left;  CDE:7.94  . HIP ARTHROPLASTY Right 01/02/2019   Procedure: ARTHROPLASTY BIPOLAR HIP (HEMIARTHROPLASTY);  Surgeon: Paralee Cancel, MD;  Location: [facility];  Service: Orthopedics;  Laterality: Right;  . HUMERUS FRACTURE SURGERY Left    had surgery x3 on it.  . REPLACEMENT TOTAL KNEE BILATERAL      Social History   Socioeconomic History  . Marital status: Married    Spouse name: Not on file  . Number of children: Not on file  . Years of education: Not on file  . Highest education level: Not on file  Occupational History  . Not on file  Social Needs  . Financial resource strain: Not hard at all  . Food insecurity    Worry: Patient refused    Inability: Patient refused  . Transportation needs    Medical: Patient refused    Non-medical: Patient refused  Tobacco Use  . Smoking status: Former Smoker    Packs/day: 2.00    Years: 30.00    Pack years: 60.00    Types: Cigarettes  . Smokeless tobacco: Former Systems developer    Quit date: 11/22/1997  Substance and Sexual Activity  . Alcohol use: Yes    Comment: 1 mixed drink at night  . Drug use: No  . Sexual activity: Yes    Birth control/protection: Post-menopausal  Lifestyle  . Physical activity    Days per week: 0 days    Minutes per session: 0 min  . Stress: Very much  Relationships  . Social connections    Talks on phone: More than three times a week    Gets together: More than three times a week    Attends religious service: Never    Active member of club or organization: No    Attends meetings of clubs or organizations: Never    Relationship status: Married  . Intimate partner violence    Fear of current or ex partner: No    Emotionally abused: No     Physically abused: No    Forced sexual activity: No  Other Topics Concern  . Not on file  Social History Narrative  . Not on file     Vitals:   06/27/19 1335  BP: (!) 151/77  Pulse: 63  Temp: (!) 96.9 F (36.1 C)  Weight: 100 lb (45.4 kg)  Height: 5\' 1"  (1.549 m)    Wt Readings from Last 3 Encounters:  06/27/19 100 lb (45.4 kg)  01/08/19 119 lb 14.9 oz (54.4 kg)  11/03/18 104 lb (47.2 kg)     PHYSICAL EXAM General: NAD HEENT: Normal. Neck: No JVD, no thyromegaly. Lungs: Clear to auscultation bilaterally with normal respiratory effort. CV: Regular rate and rhythm, normal S1/S2, no S3/S4, no murmur. No  pretibial or periankle edema.  No carotid bruit.   Abdomen: Soft, nontender, no distention.  Neurologic: Alert and oriented.  Psych: Normal affect. Skin: Normal. Musculoskeletal: No gross deformities.    ECG: Reviewed above under Subjective   Labs: Lab Results  Component Value Date/Time   K 3.3 (L) 01/08/2019 04:59 AM   BUN 14 01/08/2019 04:59 AM   CREATININE 0.76 01/08/2019 04:59 AM   ALT 16 01/11/2017 03:34 PM   HGB 9.3 (L) 01/08/2019 04:59 AM     Lipids: No results found for: LDLCALC, LDLDIRECT, CHOL, TRIG, HDL     ASSESSMENT AND PLAN:  1. Syncope: Unclear etiology at this time.No recurrences. Event monitoring was normal. There is no history to suggest ischemic heart disease and there is no family history of heart disease either.  2. Hypertension: Blood pressure is elevated today.  This will need continued monitoring by her PCP.     Disposition: Follow up as needed   Prentice Docker, M.D., F.A.C.C.

## 2019-06-27 NOTE — Patient Instructions (Signed)
Medication Instructions:  Your physician recommends that you continue on your current medications as directed. Please refer to the Current Medication list given to you today.   Labwork: NONE  Testing/Procedures: NONE  Follow-Up: Your physician recommends that you schedule a follow-up appointment in: AS NEEDED      Any Other Special Instructions Will Be Listed Below (If Applicable).     If you need a refill on your cardiac medications before your next appointment, please call your pharmacy.   

## 2019-12-24 ENCOUNTER — Ambulatory Visit: Payer: Medicare HMO | Attending: Internal Medicine

## 2019-12-24 DIAGNOSIS — Z23 Encounter for immunization: Secondary | ICD-10-CM | POA: Insufficient documentation

## 2019-12-24 NOTE — Progress Notes (Signed)
   Covid-19 Vaccination Clinic  Name:  [name] [name]    MRN: [id number] DOB: January 14, 1932  12/24/2019  [name] [name] was observed post Covid-19 immunization for 15 minutes without incidence. She was provided with Vaccine Information Sheet and instruction to access the V-Safe system.   [name] [name] was instructed to call 911 with any severe reactions post vaccine: Marland Kitchen Difficulty breathing  . Swelling of your face and throat  . A fast heartbeat  . A bad rash all over your body  . Dizziness and weakness    Immunizations Administered    Name Date Dose VIS Date Route   Pfizer COVID-19 Vaccine 12/24/2019  4:41 PM 0.3 mL 10/26/2019 Intramuscular   Manufacturer: [employer], [employer]   Lot: [id number]   NDC: [address]-4

## 2020-01-18 ENCOUNTER — Ambulatory Visit: Payer: Medicare HMO | Attending: Internal Medicine

## 2020-01-18 DIAGNOSIS — Z23 Encounter for immunization: Secondary | ICD-10-CM | POA: Insufficient documentation

## 2020-01-18 NOTE — Progress Notes (Signed)
   Covid-19 Vaccination Clinic  Name:  ERI MCEVERS    MRN: [id number] DOB: Mar 15, 1932  01/18/2020  Ms. Magallon was observed post Covid-19 immunization for 15 minutes without incident. She was provided with Vaccine Information Sheet and instruction to access the V-Safe system.   Ms. Schone was instructed to call 911 with any severe reactions post vaccine: Marland Kitchen Difficulty breathing  . Swelling of face and throat  . A fast heartbeat  . A bad rash all over body  . Dizziness and weakness   Immunizations Administered    Name Date Dose VIS Date Route   Pfizer COVID-19 Vaccine 01/18/2020  1:45 PM 0.3 mL 10/26/2019 Intramuscular   Manufacturer: [employer], [employer]   Lot: [id number]   NDC: [address]-0

## 2020-02-19 IMAGING — CT CT HEAD W/O CM
3 series · 16 of 45 positions shown, 19 images · non-contrast
Comparison: None.

CLINICAL DATA: Altered level of consciousness

EXAM:
CT HEAD WITHOUT CONTRAST
TECHNIQUE: Contiguous axial images were obtained from the base of the skull
through the vertex without intravenous contrast.

[Series 2: head wo · axial · 0.41mm/px · z∈[+1685,+1800]mm · 10 of 28 slices shown, 13 images]
[im 3/28  brain]
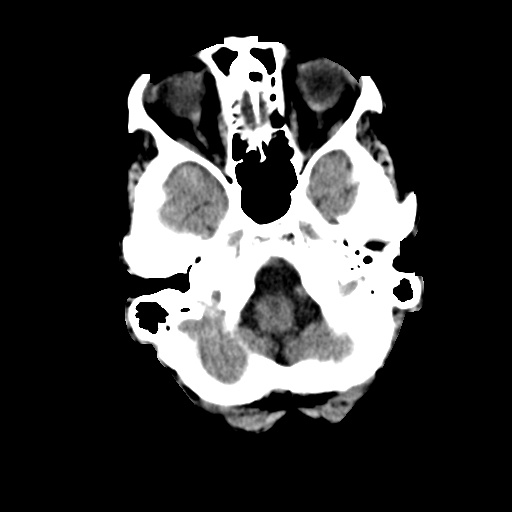
[im 3/28  bone]
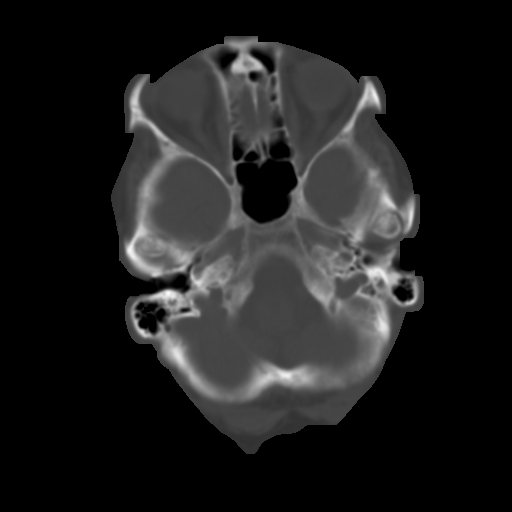
[im 5/28  brain]
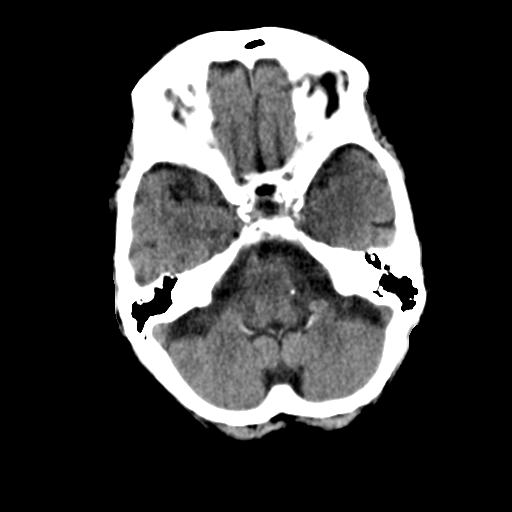
[im 8/28  brain]
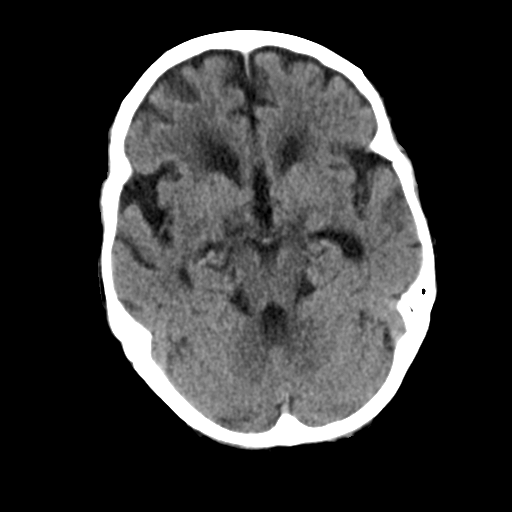
[im 11/28  brain]
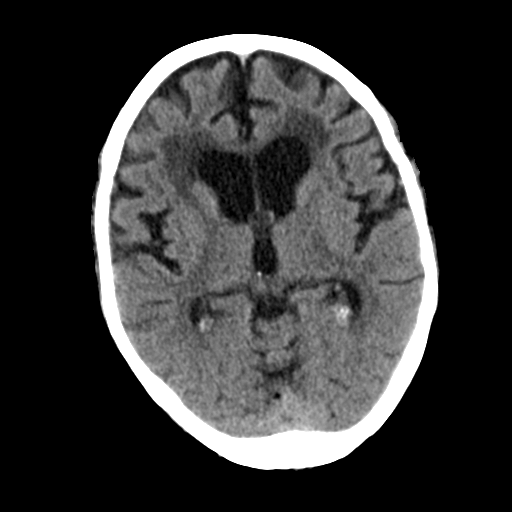
[im 13/28  brain]
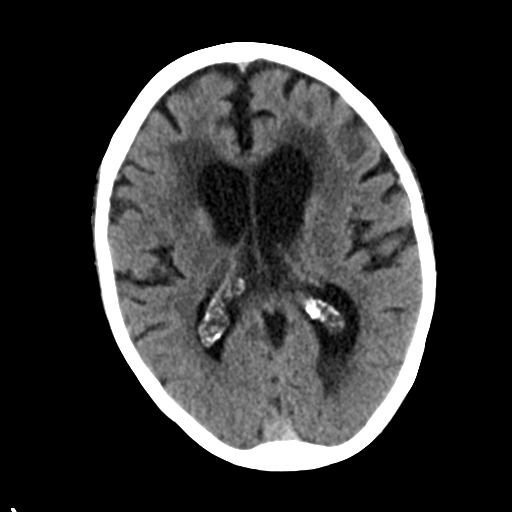
[im 13/28  bone]
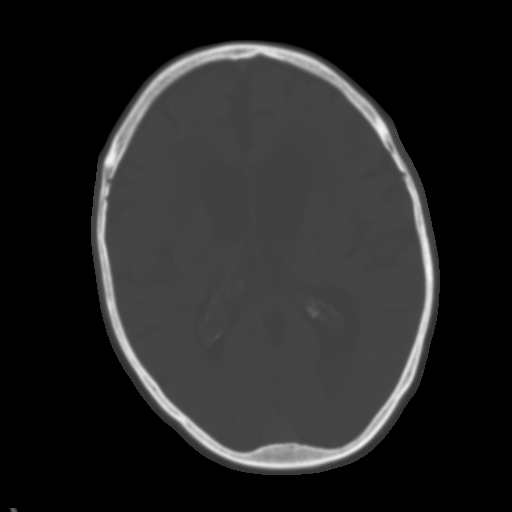
[im 16/28  brain]
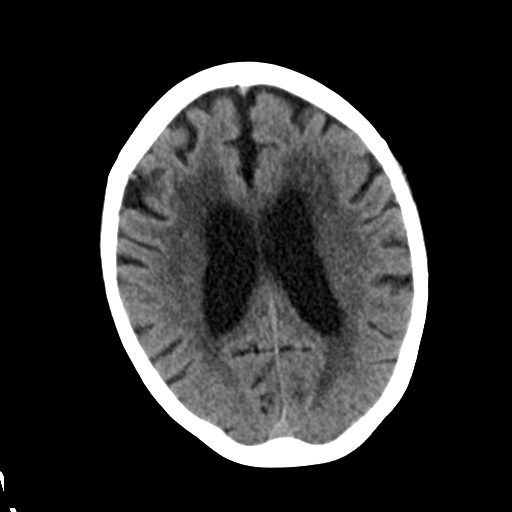
[im 18/28  brain]
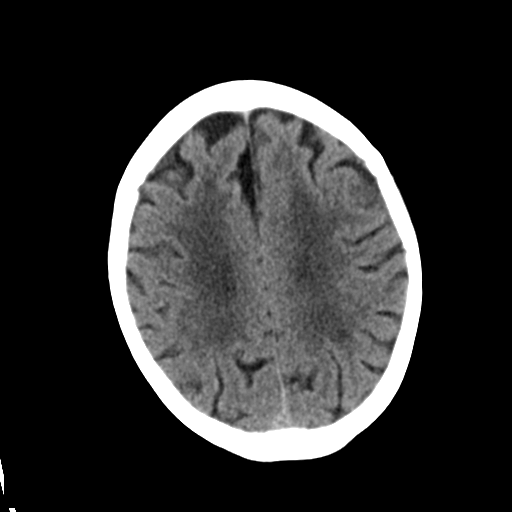
[im 21/28  brain]
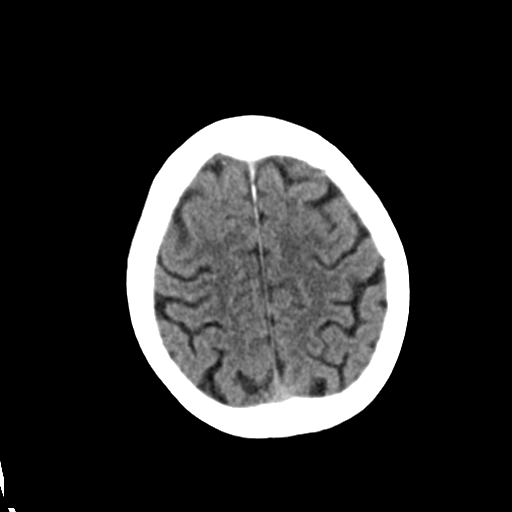
[im 24/28  brain]
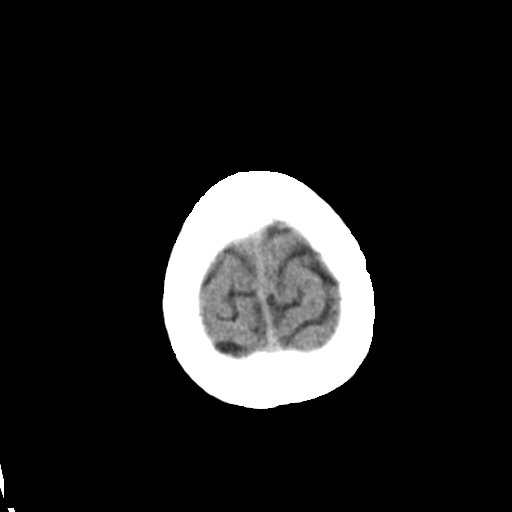
[im 24/28  bone]
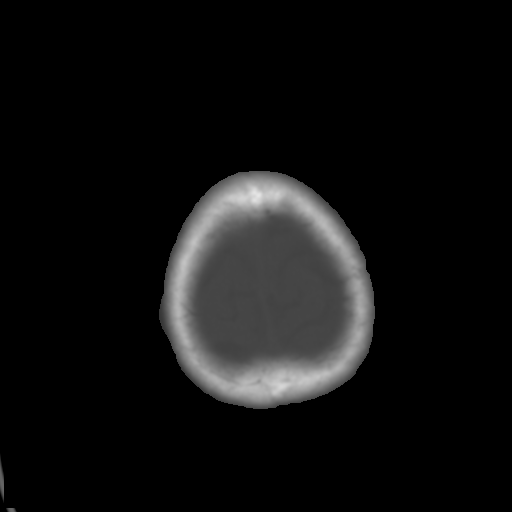
[im 26/28  brain]
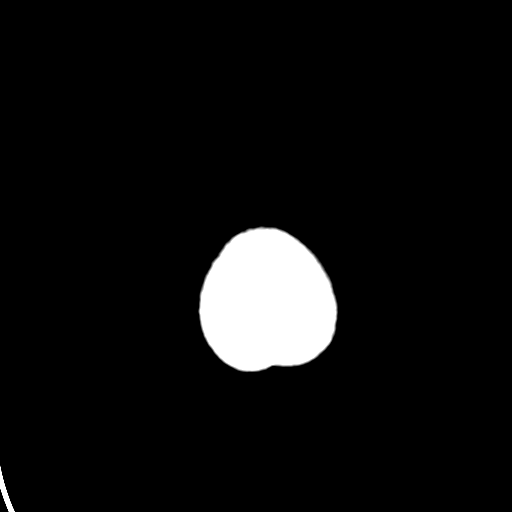

[Series 4: coronal soft tissue · coronal · 0.29mm/px · 3 of 66 slices shown]
[im 22/66  brain]
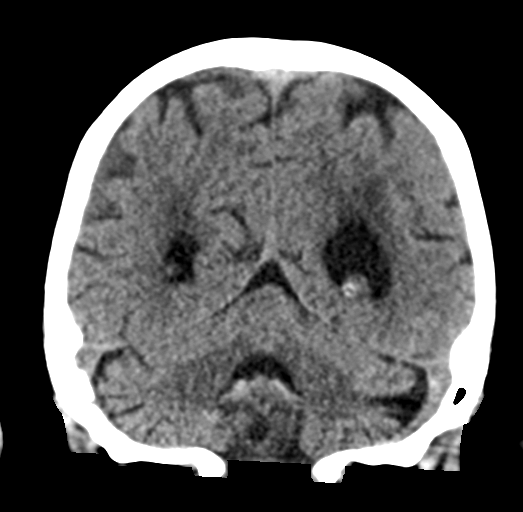
[im 29/66  brain]
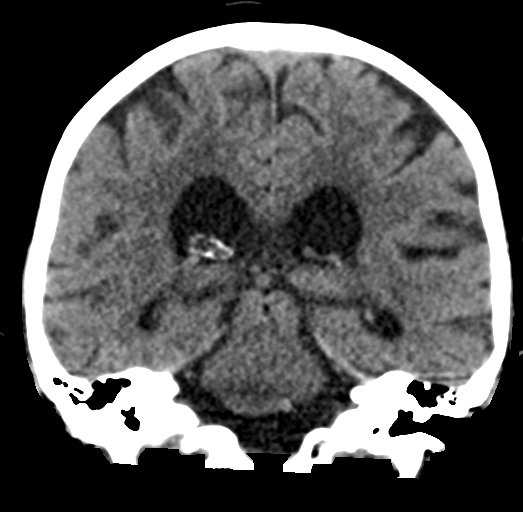
[im 37/66  brain]
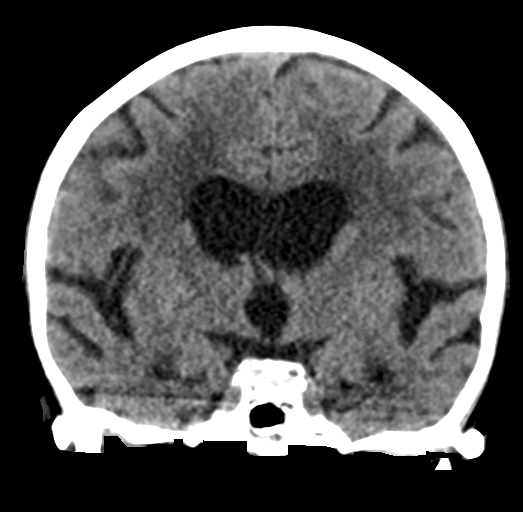

[Series 5: sagittal soft tissue · sagittal · 0.30mm/px · 3 of 52 slices shown]
[im 18/52  brain]
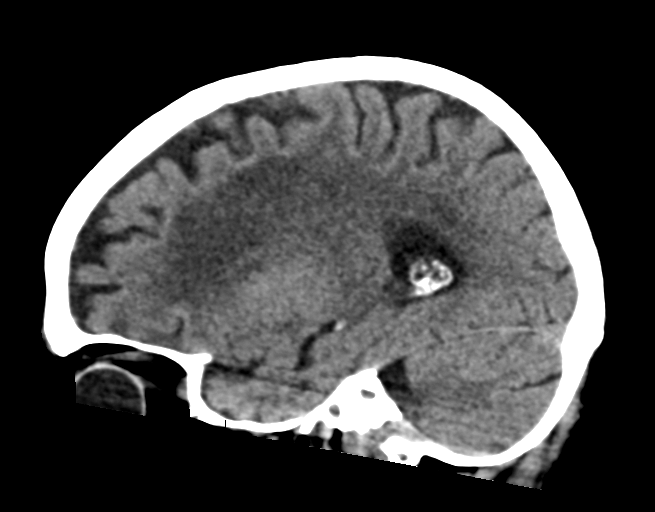
[im 26/52  brain]
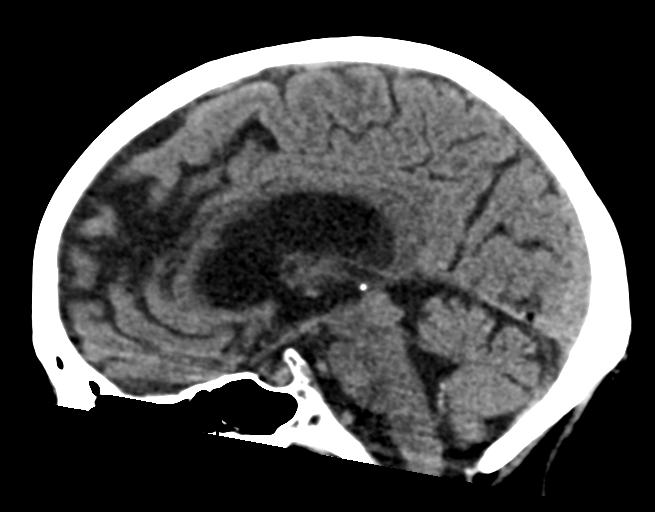
[im 35/52  brain]
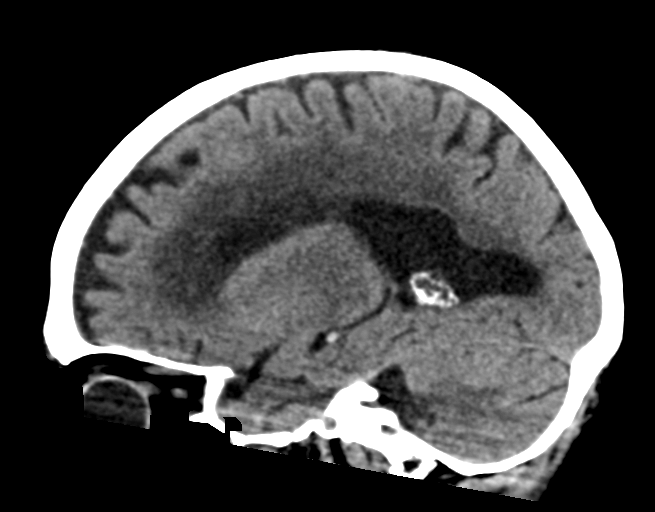

[16 of 45 positions shown; findings below may reference images not displayed]

FINDINGS: Brain: No evidence of acute infarction, hemorrhage, extra-axial
collection, ventriculomegaly, or mass effect. Generalized cerebral
atrophy. Periventricular white matter low attenuation likely
secondary to microangiopathy.

Vascular: Cerebrovascular atherosclerotic calcifications are noted.

Skull: Negative for fracture or focal lesion.

Sinuses/Orbits: Visualized portions of the orbits are unremarkable.
Visualized portions of the paranasal sinuses and mastoid air cells
are unremarkable.

Other: None.
IMPRESSION: 1. No acute intracranial pathology.
2. Chronic microvascular disease and cerebral atrophy.

## 2020-10-16 ENCOUNTER — Ambulatory Visit: Payer: Medicare HMO | Attending: Internal Medicine

## 2020-10-16 DIAGNOSIS — Z23 Encounter for immunization: Secondary | ICD-10-CM

## 2020-10-16 NOTE — Progress Notes (Signed)
   Covid-19 Vaccination Clinic  Name:  CELENE PIPPINS    MRN: [id number] DOB: 1932-07-18  10/16/2020  Ms. Seymour was observed post Covid-19 immunization for 15 minutes without incident. She was provided with Vaccine Information Sheet and instruction to access the V-Safe system.   Ms. Cozad was instructed to call 911 with any severe reactions post vaccine: Marland Kitchen Difficulty breathing  . Swelling of face and throat  . A fast heartbeat  . A bad rash all over body  . Dizziness and weakness   Immunizations Administered    Name Date Dose VIS Date Route   Pfizer COVID-19 Vaccine 10/16/2020  3:05 PM 0.3 mL 09/03/2020 Intramuscular   Manufacturer: [employer], [employer]   Lot: [id number]   NDC: [address]-3

## 2020-11-14 ENCOUNTER — Ambulatory Visit
Admission: EM | Admit: 2020-11-14 | Discharge: 2020-11-14 | Disposition: A | Discharge status: Home / Self Care | Payer: Medicare HMO | Attending: Family Medicine | Admitting: Family Medicine

## 2020-11-14 ENCOUNTER — Other Ambulatory Visit: Payer: Self-pay

## 2020-11-14 ENCOUNTER — Ambulatory Visit (INDEPENDENT_AMBULATORY_CARE_PROVIDER_SITE_OTHER): Payer: Medicare HMO

## 2020-11-14 DIAGNOSIS — Z87891 Personal history of nicotine dependence: Secondary | ICD-10-CM | POA: Diagnosis not present

## 2020-11-14 DIAGNOSIS — Z1152 Encounter for screening for COVID-19: Secondary | ICD-10-CM

## 2020-11-14 DIAGNOSIS — R059 Cough, unspecified: Secondary | ICD-10-CM

## 2020-11-14 DIAGNOSIS — R509 Fever, unspecified: Secondary | ICD-10-CM

## 2020-11-14 DIAGNOSIS — Z8709 Personal history of other diseases of the respiratory system: Secondary | ICD-10-CM | POA: Diagnosis not present

## 2020-11-14 NOTE — Discharge Instructions (Signed)
Your x-ray did not show anything concerning. Recommended rest, over-the-counter medicines as needed for fever, cough. Your Covid test is pending

## 2020-11-14 NOTE — ED Triage Notes (Signed)
Pt presents with c/o fever that was 102 on wednesday , cough and runny nose

## 2020-11-14 NOTE — ED Provider Notes (Signed)
[facility]    CSN: [id number] Arrival date & time: 11/14/20  0813      History   Chief Complaint Chief Complaint  Patient presents with  . Fever  . Cough    HPI [name] [name] is a 86 y.o. female.   Patient is a 86 year old female that presents with fever, cough and runny nose.  This started Wednesday.  Fever of 102 at home.  Taking over-the-counter medications as needed.  No specific chest pain or shortness of breath.  Patient has history of COPD.  Is on oxygen at home at nighttime     Past Medical History:  Diagnosis Date  . Anxiety   . Arthritis    rheumatoid  . COPD (chronic obstructive pulmonary disease) ([facility])   . Fall   . Hypertension     Patient Active Problem List   Diagnosis Date Noted  . Postoperative fever 01/03/2019  . Hypokalemia 01/03/2019  . History of rheumatoid arthritis 01/03/2019  . Closed right hip fracture, initial encounter ([facility]) 12/31/2018  . Essential hypertension 06/27/2018  . COPD (chronic obstructive pulmonary disease) ([facility]) 06/27/2018  . Anxiety 06/27/2018  . Headache 06/27/2018    Past Surgical History:  Procedure Laterality Date  . APPENDECTOMY    . CATARACT EXTRACTION W/PHACO Right 11/27/2013   Procedure: CATARACT EXTRACTION PHACO AND INTRAOCULAR LENS PLACEMENT (IOC);  Surgeon: Loraine Leriche T. Nile Riggs, MD;  Location: [facility];  Service: Ophthalmology;  Laterality: Right;  CDE:7.31  . CATARACT EXTRACTION W/PHACO Left 12/11/2013   Procedure: CATARACT EXTRACTION PHACO AND INTRAOCULAR LENS PLACEMENT (IOC);  Surgeon: Loraine Leriche T. Nile Riggs, MD;  Location: [facility];  Service: Ophthalmology;  Laterality: Left;  CDE:7.94  . HIP ARTHROPLASTY Right 01/02/2019   Procedure: ARTHROPLASTY BIPOLAR HIP (HEMIARTHROPLASTY);  Surgeon: Durene Romans, MD;  Location: [facility];  Service: Orthopedics;  Laterality: Right;  . HUMERUS FRACTURE SURGERY Left    had surgery x3 on it.  . REPLACEMENT TOTAL KNEE BILATERAL      OB History   No obstetric history on  file.      Home Medications    Prior to Admission medications   Medication Sig Start Date End Date Taking? Authorizing Provider  acetaminophen (TYLENOL) 500 MG tablet Take 2 tablets (1,000 mg total) by mouth every 8 (eight) hours. 01/02/19   [name], PA-C  albuterol (PROVENTIL HFA;VENTOLIN HFA) 108 (90 BASE) MCG/ACT inhaler Inhale 2 puffs into the lungs every 6 (six) hours as needed for wheezing or shortness of breath.    [provider]  ALPRAZolam Prudy Feeler) 0.5 MG tablet Take 0.5 mg by mouth 2 (two) times daily.     [provider]  amLODipine (NORVASC) 5 MG tablet Take 1 tablet (5 mg total) by mouth daily. 06/29/18 06/27/19  Johnson, Clanford L, MD  budesonide-formoterol (SYMBICORT) 160-4.5 MCG/ACT inhaler Inhale 2 puffs into the lungs 2 (two) times daily. 01/30/18   [provider]  cholecalciferol (VITAMIN D) 1000 UNITS tablet Take 1,000 Units by mouth daily.    [provider]  feeding supplement, ENSURE ENLIVE, (ENSURE ENLIVE) LIQD Take 237 mLs by mouth 2 (two) times daily between meals. 01/08/19   Almon Hercules, MD  fluticasone (FLONASE) 50 MCG/ACT nasal spray Place 2 sprays into both nostrils at bedtime.  10/12/17   [provider]  hydroxypropyl methylcellulose / hypromellose (ISOPTO TEARS / GONIOVISC) 2.5 % ophthalmic solution Place 1 drop into both eyes 3 (three) times daily as needed for dry eyes.    [provider]  leflunomide (ARAVA) 20 MG tablet Take 1 tablet (20 mg total) by mouth daily. Resume 2 weeks after surgery. 01/02/19   [name], PA-C  metoprolol tartrate (LOPRESSOR) 25 MG tablet Take 1 tablet (25 mg total) by mouth 2 (two) times daily. 06/28/18 06/27/19  Cleora Fleet, MD  Multiple Vitamin (MULTIVITAMIN WITH MINERALS) TABS tablet Take 1 tablet by mouth daily.    [provider]  ramipril (ALTACE) 10 MG capsule Take 20 mg by mouth daily.    [provider]  senna-docusate (SENOKOT-S)  8.6-50 MG tablet Take 1 tablet by mouth at bedtime as needed for mild constipation. 01/08/19   Almon Hercules, MD  traMADol (ULTRAM) 50 MG tablet Take 1-2 tablets (50-100 mg total) by mouth every 6 (six) hours as needed for moderate pain or severe pain. 01/08/19   [name], PA-C    Family History Family History  Problem Relation Age of Onset  . Hypertension Other     Social History Social History   Tobacco Use  . Smoking status: Former Smoker    Packs/day: 2.00    Years: 30.00    Pack years: 60.00    Types: Cigarettes  . Smokeless tobacco: Former Neurosurgeon    Quit date: 11/22/1997  Vaping Use  . Vaping Use: Never used  Substance Use Topics  . Alcohol use: Yes    Comment: 1 mixed drink at night  . Drug use: No     Allergies   Penicillins, Cortisone, Morphine and related, Motrin [ibuprofen], Sulfa antibiotics, and Vicodin [hydrocodone-acetaminophen]   Review of Systems Review of Systems   Physical Exam Triage Vital Signs ED Triage Vitals [11/14/20 0828]  Enc Vitals Group     BP      Pulse Rate 64     Resp 18     Temp 99.9 F (37.7 C)     Temp src      SpO2      Weight      Height      Head Circumference      Peak Flow      Pain Score      Pain Loc      Pain Edu?      Excl. in GC?    No data found.  Updated Vital Signs Pulse 64   Temp 99.9 F (37.7 C)   Resp 18   Visual Acuity Right Eye Distance:   Left Eye Distance:   Bilateral Distance:    Right Eye Near:   Left Eye Near:    Bilateral Near:     Physical Exam Vitals and nursing note reviewed.  Constitutional:      General: She is not in acute distress.    Appearance: Normal appearance. She is not ill-appearing, toxic-appearing or diaphoretic.  HENT:     Head: Normocephalic.     Nose: Nose normal.  Eyes:     Conjunctiva/sclera: Conjunctivae normal.  Cardiovascular:     Rate and Rhythm: Normal rate and regular rhythm.  Pulmonary:     Effort: Pulmonary effort is normal.     Comments:  Decreased in bases Musculoskeletal:        General: Normal range of motion.     Cervical back: Normal range of motion.  Skin:    General: Skin is warm and dry.     Findings: No rash.  Neurological:     Mental Status: She is alert.  Psychiatric:        Mood  and Affect: Mood normal.      UC Treatments / Results  Labs (all labs ordered are listed, but only abnormal results are displayed) Labs Reviewed  COVID-19, FLU A+B NAA    EKG   Radiology DG Chest 2 View  Result Date: 11/14/2020 CLINICAL DATA:  Cough and fever.  History of COPD.  Former smoker. EXAM: CHEST - 2 VIEW COMPARISON:  Chest x-ray dated 12/31/2018 FINDINGS: Heart size is within normal limits. Aortic atherosclerosis. Lungs are hyperexpanded. Chronic bronchitic changes noted centrally. No confluent opacity to suggest a superimposed pneumonia. No pleural effusion or pneumothorax is seen. Multiple chronic appearing compression fracture deformities within the mid and lower thoracic spine, with associated kyphosis. No acute appearing osseous abnormality. IMPRESSION: 1. No active cardiopulmonary disease. No evidence of pneumonia or pulmonary edema. 2. Hyperexpanded lungs indicating COPD. Associated chronic bronchitic changes centrally. 3. Aortic atherosclerosis. Electronically Signed   By: Bary Richard M.D.   On: 11/14/2020 08:59    Procedures Procedures (including critical care time)  Medications Ordered in UC Medications - No data to display  Initial Impression / Assessment and Plan / UC Course  I have reviewed the triage vital signs and the nursing notes.  Pertinent labs & imaging results that were available during my care of the patient were reviewed by me and considered in my medical decision making (see chart for details).     Cough, fever Chest x-ray without any acute findings.  Most likely something viral.  Testing for flu and Covid. Recommend rest, over-the-counter medicines as needed.  For worsening  symptoms to include increase shortness of breath she will need to go to the ER.  Follow up as needed for continued or worsening symptoms  Final Clinical Impressions(s) / UC Diagnoses   Final diagnoses:  Cough  Fever in newborn     Discharge Instructions     Your x-ray did not show anything concerning. Recommended rest, over-the-counter medicines as needed for fever, cough. Your Covid test is pending    ED Prescriptions    None     PDMP not reviewed this encounter.   Janace Aris, NP 11/14/20 [phone]

## 2020-11-19 LAB — COVID-19, FLU A+B NAA
Influenza A, NAA: NOT DETECTED
Influenza B, NAA: NOT DETECTED
SARS-CoV-2, NAA: DETECTED — AB

## 2020-12-17 ENCOUNTER — Other Ambulatory Visit: Payer: Self-pay

## 2020-12-17 ENCOUNTER — Emergency Department (HOSPITAL_COMMUNITY): Payer: Medicare HMO

## 2020-12-17 ENCOUNTER — Encounter (HOSPITAL_COMMUNITY): Payer: Self-pay

## 2020-12-17 ENCOUNTER — Emergency Department (HOSPITAL_COMMUNITY)
Admission: EM | Admit: 2020-12-17 | Discharge: 2020-12-17 | Disposition: A | Discharge status: Home / Self Care | Payer: Medicare HMO | Attending: Emergency Medicine | Admitting: Emergency Medicine

## 2020-12-17 DIAGNOSIS — R5383 Other fatigue: Secondary | ICD-10-CM | POA: Diagnosis not present

## 2020-12-17 DIAGNOSIS — Z87891 Personal history of nicotine dependence: Secondary | ICD-10-CM | POA: Diagnosis not present

## 2020-12-17 DIAGNOSIS — R4182 Altered mental status, unspecified: Secondary | ICD-10-CM | POA: Diagnosis present

## 2020-12-17 DIAGNOSIS — Z79899 Other long term (current) drug therapy: Secondary | ICD-10-CM | POA: Diagnosis not present

## 2020-12-17 DIAGNOSIS — I10 Essential (primary) hypertension: Secondary | ICD-10-CM | POA: Diagnosis not present

## 2020-12-17 DIAGNOSIS — J449 Chronic obstructive pulmonary disease, unspecified: Secondary | ICD-10-CM | POA: Diagnosis not present

## 2020-12-17 DIAGNOSIS — Z96653 Presence of artificial knee joint, bilateral: Secondary | ICD-10-CM | POA: Insufficient documentation

## 2020-12-17 DIAGNOSIS — Z8616 Personal history of COVID-19: Secondary | ICD-10-CM | POA: Diagnosis not present

## 2020-12-17 DIAGNOSIS — R0602 Shortness of breath: Secondary | ICD-10-CM | POA: Diagnosis not present

## 2020-12-17 LAB — CBC WITH DIFFERENTIAL/PLATELET
Abs Immature Granulocytes: 0.02 10*3/uL (ref 0.00–0.07)
Basophils Absolute: 0 10*3/uL (ref 0.0–0.1)
Basophils Relative: 0 %
Eosinophils Absolute: 0.4 10*3/uL (ref 0.0–0.5)
Eosinophils Relative: 8 %
HCT: 30.5 % — ABNORMAL LOW (ref 36.0–46.0)
Hemoglobin: 9.4 g/dL — ABNORMAL LOW (ref 12.0–15.0)
Immature Granulocytes: 0 %
Lymphocytes Relative: 18 %
Lymphs Abs: 1 10*3/uL (ref 0.7–4.0)
MCH: 28.4 pg (ref 26.0–34.0)
MCHC: 30.8 g/dL (ref 30.0–36.0)
MCV: 92.1 fL (ref 80.0–100.0)
Monocytes Absolute: 0.9 10*3/uL (ref 0.1–1.0)
Monocytes Relative: 17 %
Neutro Abs: 3.2 10*3/uL (ref 1.7–7.7)
Neutrophils Relative %: 57 %
Platelets: 207 10*3/uL (ref 150–400)
RBC: 3.31 MIL/uL — ABNORMAL LOW (ref 3.87–5.11)
RDW: 14.5 % (ref 11.5–15.5)
WBC: 5.5 10*3/uL (ref 4.0–10.5)
nRBC: 0 % (ref 0.0–0.2)

## 2020-12-17 LAB — URINALYSIS, ROUTINE W REFLEX MICROSCOPIC
Bilirubin Urine: NEGATIVE
Glucose, UA: NEGATIVE mg/dL
Hgb urine dipstick: NEGATIVE
Ketones, ur: 5 mg/dL — AB
Nitrite: NEGATIVE
Protein, ur: NEGATIVE mg/dL
Specific Gravity, Urine: >1.046 — ABNORMAL HIGH (ref 1.005–1.030)
pH: 6 (ref 5.0–8.0)

## 2020-12-17 LAB — COMPREHENSIVE METABOLIC PANEL
ALT: 8 U/L (ref 0–44)
AST: 12 U/L — ABNORMAL LOW (ref 15–41)
Albumin: 2.9 g/dL — ABNORMAL LOW (ref 3.5–5.0)
Alkaline Phosphatase: 44 U/L (ref 38–126)
Anion gap: 8 (ref 5–15)
BUN: 24 mg/dL — ABNORMAL HIGH (ref 8–23)
CO2: 22 mmol/L (ref 22–32)
Calcium: 9 mg/dL (ref 8.9–10.3)
Chloride: 101 mmol/L (ref 98–111)
Creatinine, Ser: 0.87 mg/dL (ref 0.44–1.00)
GFR, Estimated: >60 mL/min (ref >60)
Glucose, Bld: 94 mg/dL (ref 70–99)
Potassium: 4 mmol/L (ref 3.5–5.1)
Sodium: 131 mmol/L — ABNORMAL LOW (ref 135–145)
Total Bilirubin: 0.8 mg/dL (ref 0.3–1.2)
Total Protein: 6.1 g/dL — ABNORMAL LOW (ref 6.5–8.1)

## 2020-12-17 LAB — PROTIME-INR
INR: 1.1 (ref 0.8–1.2)
Prothrombin Time: 14.1 seconds (ref 11.4–15.2)

## 2020-12-17 LAB — LACTIC ACID, PLASMA
Lactic Acid, Venous: 1 mmol/L (ref 0.5–1.9)
Lactic Acid, Venous: 1.1 mmol/L (ref 0.5–1.9)

## 2020-12-17 LAB — APTT: aPTT: 37 seconds — ABNORMAL HIGH (ref 24–36)

## 2020-12-17 MED ORDER — SODIUM CHLORIDE 0.9 % IV SOLN: 1.0000 g | Freq: Two times a day (BID) | INTRAVENOUS | Status: DC

## 2020-12-17 MED ORDER — SODIUM CHLORIDE 0.9 % IV SOLN
1.0000 g | Freq: Once | INTRAVENOUS | Status: AC
  Administered 2020-12-17: 1 g via INTRAVENOUS
  Filled 2020-12-17: qty 1

## 2020-12-17 MED ORDER — IOHEXOL 350 MG/ML SOLN
100.0000 mL | Freq: Once | INTRAVENOUS | Status: AC | PRN
  Administered 2020-12-17: 75 mL via INTRAVENOUS

## 2020-12-17 MED ORDER — LACTATED RINGERS IV SOLN: INTRAVENOUS | Status: DC

## 2020-12-17 NOTE — ED Provider Notes (Signed)
[name]ENCY DEPARTMENT Provider Note   CSN: [id number] Arrival date & time: 12/17/20  1244     History Chief Complaint  Patient presents with   Altered Mental Status    [name] [name] is a 87 y.o. female.  HPI    87 year old female comes in a chief complaint of altered mental status.  Patient has history of COPD and she was diagnosed with COVID-19 on Dec 31.  She lives at her home.  According to family, over the last week she has gotten profoundly weak.  Normally she is able to ambulate on her own power.  She is also had few episodes of confusion, blank stare.  There is no history of seizures.  Patient is noted to be febrile here.  Family reports that yesterday, the had a virtual visit with the doctor and that was the first time they noted that she had a low-grade fever.  Patient denies any burning with urination, blood in the urine, urinary frequency, abdominal pain, headaches, neck pain.  Patient does have a laceration to her ankle from a recent trauma.  No pus drainage.  Past Medical History:  Diagnosis Date   Anxiety    Arthritis    rheumatoid   COPD (chronic obstructive pulmonary disease) (HCC)    Fall    Hypertension     Patient Active Problem List   Diagnosis Date Noted   Postoperative fever 01/03/2019   Hypokalemia 01/03/2019   History of rheumatoid arthritis 01/03/2019   Closed right hip fracture, initial encounter (HCC) 12/31/2018   Essential hypertension 06/27/2018   COPD (chronic obstructive pulmonary disease) (HCC) 06/27/2018   Anxiety 06/27/2018   Headache 06/27/2018    Past Surgical History:  Procedure Laterality Date   APPENDECTOMY     CATARACT EXTRACTION W/PHACO Right 11/27/2013   Procedure: CATARACT EXTRACTION PHACO AND INTRAOCULAR LENS PLACEMENT (IOC);  Surgeon: Loraine Leriche T. Nile Riggs, MD;  Location: [facility];  Service: Ophthalmology;  Laterality: Right;  CDE:7.31   CATARACT EXTRACTION W/PHACO Left 12/11/2013   Procedure:  CATARACT EXTRACTION PHACO AND INTRAOCULAR LENS PLACEMENT (IOC);  Surgeon: Loraine Leriche T. Nile Riggs, MD;  Location: [facility];  Service: Ophthalmology;  Laterality: Left;  CDE:7.94   HIP ARTHROPLASTY Right 01/02/2019   Procedure: ARTHROPLASTY BIPOLAR HIP (HEMIARTHROPLASTY);  Surgeon: Durene Romans, MD;  Location: [facility];  Service: Orthopedics;  Laterality: Right;   HUMERUS FRACTURE SURGERY Left    had surgery x3 on it.   REPLACEMENT TOTAL KNEE BILATERAL       OB History   No obstetric history on file.     Family History  Problem Relation Age of Onset   Hypertension Other     Social History   Tobacco Use   Smoking status: Former Smoker    Packs/day: 2.00    Years: 30.00    Pack years: 60.00    Types: Cigarettes   Smokeless tobacco: Former Neurosurgeon    Quit date: 11/22/1997  Vaping Use   Vaping Use: Never used  Substance Use Topics   Alcohol use: Yes    Comment: 1 mixed drink at night   Drug use: No    Home Medications Prior to Admission medications   Medication Sig Start Date End Date Taking? Authorizing Provider  acetaminophen (TYLENOL) 500 MG tablet Take 2 tablets (1,000 mg total) by mouth every 8 (eight) hours. 01/02/19  Yes Babish, Molli Hazard, PA-C  albuterol (PROVENTIL HFA;VENTOLIN HFA) 108 (90 BASE) MCG/ACT inhaler Inhale 2 puffs into the lungs every 6 (six) hours  as needed for wheezing or shortness of breath.   Yes [provider]  ALPRAZolam Prudy Feeler) 0.5 MG tablet Take 0.5 mg by mouth 2 (two) times daily.    Yes [provider]  amLODipine (NORVASC) 5 MG tablet Take 1 tablet (5 mg total) by mouth daily. 06/29/18 06/27/19 Yes Johnson, Clanford L, MD  budesonide-formoterol (SYMBICORT) 160-4.5 MCG/ACT inhaler Inhale 2 puffs into the lungs 2 (two) times daily. 01/30/18  Yes [provider]  cholecalciferol (VITAMIN D) 1000 UNITS tablet Take 1,000 Units by mouth daily.   Yes [provider]  feeding supplement, ENSURE ENLIVE, (ENSURE ENLIVE) LIQD Take  237 mLs by mouth 2 (two) times daily between meals. 01/08/19  Yes Almon Hercules, MD  fluticasone (FLONASE) 50 MCG/ACT nasal spray Place 2 sprays into both nostrils at bedtime.  10/12/17  Yes [provider]  hydroxypropyl methylcellulose / hypromellose (ISOPTO TEARS / GONIOVISC) 2.5 % ophthalmic solution Place 1 drop into both eyes 3 (three) times daily as needed for dry eyes.   Yes [provider]  leflunomide (ARAVA) 20 MG tablet Take 1 tablet (20 mg total) by mouth daily. Resume 2 weeks after surgery. Patient taking differently: Take 20 mg by mouth daily. 01/02/19  Yes Babish, Molli Hazard, PA-C  metoprolol tartrate (LOPRESSOR) 25 MG tablet Take 1 tablet (25 mg total) by mouth 2 (two) times daily. 06/28/18 06/27/19 Yes Johnson, Clanford L, MD  Multiple Vitamin (MULTIVITAMIN WITH MINERALS) TABS tablet Take 1 tablet by mouth daily.   Yes [provider]  ramipril (ALTACE) 10 MG capsule Take 20 mg by mouth daily.   Yes [provider]  senna-docusate (SENOKOT-S) 8.6-50 MG tablet Take 1 tablet by mouth at bedtime as needed for mild constipation. 01/08/19  Yes Almon Hercules, MD  traMADol (ULTRAM) 50 MG tablet Take 1-2 tablets (50-100 mg total) by mouth every 6 (six) hours as needed for moderate pain or severe pain. 01/08/19   [name], PA-C    Allergies    Penicillins, Sulfa antibiotics, Cortisone, Morphine and related, Vicodin [hydrocodone-acetaminophen], and Motrin [ibuprofen]  Review of Systems   Review of Systems  Constitutional: Positive for activity change, fatigue and fever.  Respiratory: Positive for cough.   Cardiovascular: Negative for chest pain.  Gastrointestinal: Negative for nausea and vomiting.  Allergic/Immunologic: Negative for immunocompromised state.  Neurological: Negative for dizziness.  Hematological: Does not bruise/bleed easily.  All other systems reviewed and are negative.   Physical Exam Updated Vital Signs BP 137/64 (BP Location:  Right Arm)    Pulse 78    Temp 98.4 F (36.9 C) (Oral)    Resp 17    Ht 5\' 1"  (1.549 m)    Wt 49.9 kg    SpO2 98%    BMI 20.78 kg/m   Physical Exam Vitals and nursing note reviewed.  Constitutional:      Appearance: She is well-developed.  HENT:     Head: Normocephalic and atraumatic.  Eyes:     Extraocular Movements: EOM normal.  Cardiovascular:     Rate and Rhythm: Normal rate.  Pulmonary:     Effort: Pulmonary effort is normal.  Abdominal:     General: Bowel sounds are normal.  Musculoskeletal:        General: No tenderness or deformity.     Cervical back: Normal range of motion and neck supple.     Right lower leg: No edema.     Left lower leg: No edema.  Skin:  General: Skin is warm and dry.     Findings: Bruising, erythema and rash present.  Neurological:     Mental Status: She is alert and oriented to person, place, and time.     ED Results / Procedures / Treatments   Labs (all labs ordered are listed, but only abnormal results are displayed) Labs Reviewed  COMPREHENSIVE METABOLIC PANEL - Abnormal; Notable for the following components:      Result Value   Sodium 131 (*)    BUN 24 (*)    Total Protein 6.1 (*)    Albumin 2.9 (*)    AST 12 (*)    All other components within normal limits  CBC WITH DIFFERENTIAL/PLATELET - Abnormal; Notable for the following components:   RBC 3.31 (*)    Hemoglobin 9.4 (*)    HCT 30.5 (*)    All other components within normal limits  URINALYSIS, ROUTINE W REFLEX MICROSCOPIC - Abnormal; Notable for the following components:   Specific Gravity, Urine >1.046 (*)    Ketones, ur 5 (*)    Leukocytes,Ua TRACE (*)    Bacteria, UA RARE (*)    All other components within normal limits  APTT - Abnormal; Notable for the following components:   aPTT 37 (*)    All other components within normal limits  CULTURE, BLOOD (ROUTINE X 2)  CULTURE, BLOOD (ROUTINE X 2)  URINE CULTURE  LACTIC ACID, PLASMA  LACTIC ACID, PLASMA  PROTIME-INR     EKG EKG Interpretation  Date/Time:  Wednesday December 17 2020 13:13:07 EST Ventricular Rate:  76 PR Interval:    QRS Duration: 75 QT Interval:  356 QTC Calculation: 401 R Axis:   66 Text Interpretation: Sinus rhythm Probable anteroseptal infarct, old No acute changes No significant change since last tracing Confirmed by Derwood Kaplan [phone]) on 12/17/2020 4:09:29 PM   Radiology No results found.  Procedures Procedures   Medications Ordered in ED Medications  meropenem (MERREM) 1 g in sodium chloride 0.9 % 100 mL IVPB (0 g Intravenous Stopped 12/17/20 1615)  iohexol (OMNIPAQUE) 350 MG/ML injection 100 mL (75 mLs Intravenous Contrast Given 12/17/20 1738)    ED Course  I have reviewed the triage vital signs and the nursing notes.  Pertinent labs & imaging results that were available during my care of the patient were reviewed by me and considered in my medical decision making (see chart for details).    MDM Rules/Calculators/A&P                          87 year old female comes in a chief complaint of weakness, confusion. Patient here with her daughter.  The daughter has become the primary caregiver the last few days.  Patient had COVID-19 around New Year's Eve.  -There is increased oxygen requirement during daytime.  We will get a CT angio to rule out PE -CT scan also to look and see if there is any superimposed pneumonia. -Infection work-up also includes getting urine analysis, basic labs. -Basic labs to ensure no metabolic derangement. - CT head to ordered no acute abnormality in the brain. No focal neuro deficits.  Dr. Deretha Emory to follow up. He is aware that family is interested in rehab with declining ability from the patient.  Final Clinical Impression(s) / ED Diagnoses Final diagnoses:  Fatigue, unspecified type  SOB (shortness of breath)    Rx / DC Orders ED Discharge Orders    None  Derwood Kaplan, MD 12/22/20 [phone]

## 2020-12-17 NOTE — ED Provider Notes (Signed)
Extensive work-up here without any significant findings to explain her symptoms other than probably some dehydration.  Patient was slowly hydrated here.  Urinalysis negative for urinary tract infection CT angio negative for pulmonary embolus or any acute lung findings.  No significant leukocytosis.  No significant electrolyte abnormality other than a slight decrease in sodium at 131 but receiving the IV fluids here should help improve that.  BUN up some at 24 but creatinine was good at 0.87.  Liver function test without significant abnormalities.  Patient would prefer to go home I will go ahead and discharge home.  She does have home oxygen available she has been on 2 L here but she will sat in the 99 to 100% at worst 95% on the 2 L.  So may not need that but she feels more comfortable with it so she can continue that at home.   Vanetta Mulders, MD 12/17/20 2234

## 2020-12-17 NOTE — ED Notes (Signed)
Pt states she can not urinate at this time. Offered in/out cath to patient per MD orders. Pt refuses in/out cath at this time. Pt given cup of water and placed on purewick. encouraged to urinate. Will continue to monitor patient.

## 2020-12-17 NOTE — ED Triage Notes (Signed)
Pt brought to ED via [facility] for altered mental status and difficulty walking off and on for the past few days. Pt also with fever intermittently and history of covid diagnosed on 12/31.

## 2020-12-17 NOTE — Progress Notes (Signed)
Pharmacy Antibiotic Note  [name] [name] is a 87 y.o. female admitted on 12/17/2020 with UTI.  Patient has penicillin allergy  Pharmacy has been consulted for meropenem dosing.  Plan: Meropenem 1000 mg IV every 12 hours. Will follow-up de-escalation to aztreonam since do not see history of ESBL listed. Monitor labs, c/s, and patient improvement.  Height: 5\' 1"  (154.9 cm) Weight: 49.9 kg (110 lb) IBW/kg (Calculated) : 47.8  Temp (24hrs), Avg:101.5 F (38.6 C), Min:101.5 F (38.6 C), Max:101.5 F (38.6 C)  Recent Labs  Lab 12/17/20 1535 12/17/20 1544  WBC  --  5.5  CREATININE  --  0.87  LATICACIDVEN 1.0  --     Estimated Creatinine Clearance: 35 mL/min (by C-G formula based on SCr of 0.87 mg/dL).    Allergies  Allergen Reactions  . Penicillins Swelling    Has patient had a PCN reaction causing immediate rash, facial/tongue/throat swelling, SOB or lightheadedness with hypotension: Yes Has patient had a PCN reaction causing severe rash involving mucus membranes or skin necrosis: No Has patient had a PCN reaction that required hospitalization No Has patient had a PCN reaction occurring within the last 10 years: No If all of the above answers are "NO", then may proceed with Cephalosporin use.   . Cortisone Other (See Comments)    Irregular Heart Beat  . Morphine And Related Nausea And Vomiting  . Motrin [Ibuprofen]   . Sulfa Antibiotics Hives  . Vicodin [Hydrocodone-Acetaminophen] Nausea And Vomiting    Antimicrobials this admission: Merrem 2/2 >>     Microbiology results: 2/2 BCx: pending 2/2 UCx: pending    Thank you for allowing pharmacy to be a part of this patient's care.  02/14/21 12/17/2020 5:05 PM

## 2020-12-17 NOTE — Discharge Instructions (Signed)
Extensive work-up here tonight without any acute explanations for the symptoms.  May be just persistent residual Covid symptoms.  No evidence of any blood clots in the lungs.  No evidence of pneumonia.  Urinary tract infection is negative.  Did receive gentle fluid hydration here which will be of benefit.  Follow-up with your doctor return for any new or worse symptoms.

## 2020-12-19 ENCOUNTER — Telehealth: Payer: Self-pay

## 2020-12-19 LAB — URINE CULTURE: Culture: NO GROWTH

## 2020-12-19 NOTE — Telephone Encounter (Signed)
Spoke with patient's daughter Arline Asp and scheduled an in-person Palliative Consult for 01/02/21 @ 9AM  COVID screening was negative. No pets in home. Patient lives with husband and son.  Consent obtained; updated Outlook/Netsmart/Team List and Epic.  Family is aware they will be receiving a call from NP the day before or day of to confirm appointment.

## 2020-12-22 LAB — CULTURE, BLOOD (ROUTINE X 2)
Culture: NO GROWTH
Culture: NO GROWTH
Special Requests: ADEQUATE
Special Requests: ADEQUATE

## 2021-01-02 ENCOUNTER — Other Ambulatory Visit: Payer: Self-pay

## 2021-01-02 ENCOUNTER — Other Ambulatory Visit: Payer: Self-pay | Admitting: Nurse Practitioner

## 2021-01-02 DIAGNOSIS — R531 Weakness: Secondary | ICD-10-CM

## 2021-01-02 DIAGNOSIS — Z515 Encounter for palliative care: Secondary | ICD-10-CM

## 2021-01-02 NOTE — Progress Notes (Signed)
[facility] Consult Note Telephone: [phone]  Fax: [phone]  PATIENT NAME: [name] [id number] [phone] (home)  DOB: 1932/10/29 MRN: [id number]  PRIMARY CARE PROVIDER:    Amie Critchley,  [address] [place] [address] [phone]  REFERRING PROVIDER:   Aletha Halim., PA-C [address],  [place] [address] [phone]  RESPONSIBLE PARTY:   Extended Emergency Contact Information Primary Emergency Contact: Morris,Cindy          [place], [place] [address] Johnnette Litter of [place] Phone: [phone] Mobile Phone: [phone] Relation: Daughter  I met face to face with patient and family in home.  ASSESSMENT AND RECOMMENDATIONS:   Advance Care Planning: Today's visit consisted of building trust and discussions on Palliative care medicine as specialized medical care for people living with serious illness, aimed at facilitating improved quality of life through symptoms relief, assisting with advance care planning and establishing goals of care. Present at visit is patient's daughter [name], she expressed appreciation for education provided on Palliative care and how it differs from Hospice service.  Goal of care: Patient's goal of care is comfort, family desires for patient to be comfortable as she lives out the rest of her life. Family does not desire aggressive treatment for any acute illness.  Directives: [name] report family does not want patient to be  resuscitated in the event of cardiac or respiratory arrest. DNR form signed for patient. The need to complete a MOST form was discussed, patient and daughter expressed interest in completing a MOST form today. Discussed and reviewed sections of the form in detail, opportunity for questions given, all questions answered. Form completed and signed with daughter, signed form given to daughter to keep. Details  of MOST include comfort measures, antibiotics if indicated, IV fluids for defined period of time, no feeding tube  Cognitive / Functional decline / Symptom Management:  Patient awake, falls asleep intermittently, coherent. She is dependent on persons for bathing and dressing, feeds self. Requires standby assist with ambulation, ambulates with a Rolator walker. Patient with history of frequent falls. No report recent COPD exacerbation. Patient denied uncontrolled, denied insomnia, report good appetite, endorsed feeling well. 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 Recommendation: Advised family to make calorie dense fruit smoothies, family to add vegetables and proteins as tolerated, drinking smoothies would ease the burden of chewing food. Family verbalized agreement with plan. Questions and concerns were addressed. The patient/family was encouraged to call with questions and/or concerns. My business card was provided. Provided general support and encouragement, no other unmet needs identified at this time. Palliative care will continue to provide support to patient, family and the medical team.  Follow up Palliative Care Visit: Palliative care will continue to follow for complex decision making and symptom management. Return in about 4 weeks or prn.  Family /Caregiver/Community Supports: Patient lives with her son. Her husband lives in the same home. She has 3 children who are very involved in her care. She has paid personal care providers 8hrs a day, and from next week will have additional help from 10pm to 8AM.  I spent 60 minutes providing this consultation, time includes time spent with patient and family, chart review, provider coordination, and documentation. More than 50% of the time in this consultation was spent counseling and coordinating  communication.   CHIEF  COMPLAINT: Initial palliative care visit  History obtained from review of EMR and discussion with patient and family. Records reviewed and summarized bellow.  HISTORY OF PRESENT ILLNESS:  [name] [name] is a 87 y.o. year old female with multiple medical problems including COPD, Rheumatoid arthritis, HTN. Patient with ongoing decline in function. Patient is s/p ED visit for weakness and confusion, she was gently rehydrated and discharged home. Palliative Care was asked to follow this patient by consultation request of Aletha Halim., PA-C to help address advance care planning and goals of care.   CODE STATUS: DNR  PPS: 40%  HOSPICE ELIGIBILITY/DIAGNOSIS: TBD  PHYSICAL EXAM / ROS:  Vital signs: BP 120/58, P 62, 92% on room air Current and past weights: on 12/17/2020 weight 109.78lbs, Ht 74f 1", BMI 20.78kg/m2 General:  Chronically ill and frail appearing, thin, sitting in recliner in NAD Cardiovascular: denied chest pain, denied palpitation, no edema  Pulmonary: no cough, no increased SOB, room air GI: no report of swallowing issues, appetite fair, denied constipation, continent of bowel GU: denies dysuria, continent of urine MSK: no joint and ROM abnormalities, ambulatory Skin: skin tear to right medial right ankle, skin thin and fragile Neurological: Weakness, but otherwise nonfocal Psych: non -anxious affect  Labs on 12/17/2020 reviewed Na 131, BUN 24, Cr 0.87, Albumin 2.9, GFR > 60  PAST MEDICAL HISTORY:  Past Medical History:  Diagnosis Date  . Anxiety   . Arthritis    rheumatoid  . COPD (chronic obstructive pulmonary disease) ([facility])   . Fall   . Hypertension     SOCIAL HX:  Social History   Tobacco Use  . Smoking status: Former Smoker    Packs/day: 2.00    Years: 30.00    Pack years: 60.00    Types: Cigarettes  . Smokeless tobacco: Former Systems developer    Quit date: 11/22/1997  Substance Use Topics  . Alcohol use: Yes    Comment: 1 mixed drink at night   FAMILY HX:   Family History  Problem Relation Age of Onset  . Hypertension Other     ALLERGIES:  Allergies  Allergen Reactions  . Penicillins Swelling, Anaphylaxis, Hives and Nausea And Vomiting    Has patient had a PCN reaction causing immediate rash, facial/tongue/throat swelling, SOB or lightheadedness with hypotension: Yes Has patient had a PCN reaction causing severe rash involving mucus membranes or skin necrosis: No Has patient had a PCN reaction that required hospitalization No Has patient had a PCN reaction occurring within the last 10 years: No If all of the above answers are "NO", then may proceed with Cephalosporin use.  Has patient had a PCN reaction causing immediate rash, facial/tongue/throat swelling, SOB or lightheadedness with hypotension: Yes Has patient had a PCN reaction causing severe rash involving mucus membranes or skin necrosis: No Has patient had a PCN reaction that required hospitalization No Has patient had a PCN reaction occurring within the last 10 years: No If all of the above answers are "NO", then may proceed with Cephalosporin use.   . Sulfa Antibiotics Hives  . Cortisone Other (See Comments)    Irregular Heart Beat  . Morphine And Related Nausea And Vomiting  . Vicodin [Hydrocodone-Acetaminophen] Nausea And Vomiting  . Motrin [Ibuprofen]      PERTINENT MEDICATIONS:  Outpatient Encounter Medications as of 01/02/2021  Medication Sig  . acetaminophen (TYLENOL) 500 MG tablet Take 2 tablets (1,000 mg total) by mouth every 8 (eight) hours.  Marland Kitchen  albuterol (PROVENTIL HFA;VENTOLIN HFA) 108 (90 BASE) MCG/ACT inhaler Inhale 2 puffs into the lungs every 6 (six) hours as needed for wheezing or shortness of breath.  . ALPRAZolam (XANAX) 0.5 MG tablet Take 0.5 mg by mouth 2 (two) times daily.   Marland Kitchen amLODipine (NORVASC) 5 MG tablet Take 1 tablet (5 mg total) by mouth daily.  . budesonide-formoterol (SYMBICORT) 160-4.5 MCG/ACT inhaler Inhale 2 puffs into the lungs 2 (two) times  daily.  . cholecalciferol (VITAMIN D) 1000 UNITS tablet Take 1,000 Units by mouth daily.  . feeding supplement, ENSURE ENLIVE, (ENSURE ENLIVE) LIQD Take 237 mLs by mouth 2 (two) times daily between meals.  . fluticasone (FLONASE) 50 MCG/ACT nasal spray Place 2 sprays into both nostrils at bedtime.   . hydroxypropyl methylcellulose / hypromellose (ISOPTO TEARS / GONIOVISC) 2.5 % ophthalmic solution Place 1 drop into both eyes 3 (three) times daily as needed for dry eyes.  Marland Kitchen leflunomide (ARAVA) 20 MG tablet Take 1 tablet (20 mg total) by mouth daily. Resume 2 weeks after surgery. (Patient taking differently: Take 20 mg by mouth daily.)  . metoprolol tartrate (LOPRESSOR) 25 MG tablet Take 1 tablet (25 mg total) by mouth 2 (two) times daily.  . Multiple Vitamin (MULTIVITAMIN WITH MINERALS) TABS tablet Take 1 tablet by mouth daily.  . ramipril (ALTACE) 10 MG capsule Take 20 mg by mouth daily.  Marland Kitchen senna-docusate (SENOKOT-S) 8.6-50 MG tablet Take 1 tablet by mouth at bedtime as needed for mild constipation.  . traMADol (ULTRAM) 50 MG tablet Take 1-2 tablets (50-100 mg total) by mouth every 6 (six) hours as needed for moderate pain or severe pain.   No facility-administered encounter medications on file as of 01/02/2021.    Thank you for the opportunity to participate in the care of Ms. Charlaine Dalton. The palliative care team will continue to follow. Please call our office at [phone] if we can be of additional assistance.   Jari Favre, DNP, AGPCNP-BC

## 2021-02-03 ENCOUNTER — Other Ambulatory Visit: Payer: Self-pay | Admitting: Nurse Practitioner

## 2021-02-03 ENCOUNTER — Other Ambulatory Visit: Payer: Self-pay

## 2021-02-03 DIAGNOSIS — Z515 Encounter for palliative care: Secondary | ICD-10-CM

## 2021-02-03 DIAGNOSIS — E43 Unspecified severe protein-calorie malnutrition: Secondary | ICD-10-CM

## 2021-02-03 NOTE — Progress Notes (Signed)
[facility] Consult Note Telephone: [phone]  Fax: [phone]  PATIENT NAME: [name] [name] [name] [name] [id number] [phone] (home)  DOB: [birth year] MRN: [id number]  PRIMARY CARE PROVIDER:    Amie Critchley,  [address] [place] [address] [phone]  REFERRING PROVIDER:   Aletha Halim., PA-C [address],  [place] [address] [phone]  RESPONSIBLE PARTY:   Extended Emergency Contact Information Primary Emergency Contact: [name],[name]          [place], [place] [address] [name] [name] of [place] Phone: [phone] Mobile Phone: [phone] Relation: Daughter  I met face to face with patient and family in home.   ASSESSMENT AND RECOMMENDATIONS:   Advance Care Planning: Goal of care: Patient's goal of care is comfort. Directives: Signed DNR and MOST form present in home, copy on [name] [name] EMR. Details of MOST include comfort measures, antibiotics if indicated, IV fluids for defined period of time, no feeding tube.  Symptom Management:  Frequent Falls: Last fall was 3 days ago, report feet slipped while getting out of bed. Sustained some bruise on his right knee area site healing without evidence of infection, no drainage. Patient denied pain, denied fever or chills. Continue to maintain safety. Patient asked to ask for assistance during transfers and change position slowly. Wound: Patient with wound to lateral side of right ankle sustain from a fall. Wound care completed by patient's daughter [name] [name] who is a Equities trader. Wound without inflammation or drainage. Continue current plaln of care. Poor appetite: Patient report supplementing meals with Boost nutritional supplemental 1 to 2 cans a day.  Insomnia: Patient report insomnia related to loss of her husband, she however report improvement in symptoms with Xanax 0.29m during the day and Xanax 0.572mar  bedtime. Emotional support provided.  Questions and concerns were addressed. Patient and family was encouraged to call with questions and/or concerns. Provided general support and encouragement, no other unmet needs identified at this time.  Follow up Palliative Care Visit: Palliative care will continue to follow for complex decision making and symptom management. Return in about 4 weeks or prn.  Family /Caregiver/Community Supports: Patient lives in her son's home, her daughter visits daily and assist in her care. She has paid personal caregivers 8hrs a day. Patient lost her husband 3 weeks ago.  Cognitive / Functional decline: Patient more awake and alert at visit today. She is coherent. Family report improved function, report patient cooked twice last week. She is dependent on persons for bathing and dressing, feeds self. Requires standby assist with transfers and ambulation, ambulates with a Rolator walker. Patient with history of frequent falls, last fall was 3 days ago.  I spent 30  minutes providing this consultation, time includes time spent with patient and family, chart review, provider coordination, and documentation. More than 50% of the time in this consultation was spent counseling and coordinating communication.   CHIEF COMPLAINT: Frequent falls  History obtained from review of EMR and discussion with patient and family. Records reviewed and summarized bellow.  HISTORY OF PRESENT ILLNESS:  [name] a [age].o. year old female with multiple medical problems including COPD, Rheumatoid arthritis, HTN. Patient with ongoing decline in function. Patient grieving the recent loss of her husband. Palliative Care was asked to help address advance care planning and goals of care.   CODE STATUS: DNR  PPS: 40%  HOSPICE ELIGIBILITY/DIAGNOSIS: yes/ severe protein calorie  malnutrition, family not ready for services at this time  PHYSICAL EXAM / ROS:  Current and past weights: 95Lbs  doen from 109.78lbs, Ht 54f1", BMI 17.9kg/m2 General:  Chronically ill and frail appearing, thin, sitting in recliner in NAD Cardiovascular: denied chest pain, denied palpitation, +1 BLE edema  Pulmonary: no cough, no increased SOB, room air GI: no report of swallowing issues, appetite fair, denied constipation, continent of bowel GU: denies dysuria, continent of urine MSK: no joint and ROM abnormalities, ambulatory Skin: skin tear to right medial right ankle (dressing intact), skin thin and fragile Neurological: Weakness, but otherwise nonfocal Psych: non -anxious affect    PAST MEDICAL HISTORY:  Past Medical History:  Diagnosis Date  . Anxiety   . Arthritis    rheumatoid  . [place]   . Fall   . Hypertension     SOCIAL HX:  Social History   Tobacco Use  . Smoking status: Former Smoker    Packs/day: 2.00    Years: 30.00    Pack years: 60.00    Types: Cigarettes  . Smokeless tobacco: Former [employer] developer   Quit date: 11/22/1997  Substance Use Topics  . Alcohol use: Yes    Comment: 1 mixed drink at night   FAMILY HX:  Family History  Problem Relation Age of Onset  . Hypertension Other     ALLERGIES:  Allergies  Allergen Reactions  . Penicillins Swelling, Anaphylaxis, Hives and Nausea And Vomiting    Has patient had a PCN reaction causing immediate rash, facial/tongue/throat swelling, SOB or lightheadedness with hypotension: Yes Has patient had a PCN reaction causing severe rash involving mucus membranes or skin necrosis: No Has patient had a PCN reaction that required hospitalization No Has patient had a PCN reaction occurring within the last 10 years: No If all of the above answers are "NO", then may proceed with Cephalosporin use.  Has patient had a PCN reaction causing immediate rash, facial/tongue/throat swelling, SOB or lightheadedness with hypotension: Yes Has patient had a PCN reaction causing severe rash involving mucus  membranes or skin necrosis: No Has patient had a PCN reaction that required hospitalization No Has patient had a PCN reaction occurring within the last 10 years: No If all of the above answers are "NO", then may proceed with Cephalosporin use.   . Sulfa Antibiotics Hives  . Cortisone Other (See Comments)    Irregular Heart Beat  . Morphine And Related Nausea And Vomiting  . Vicodin [Hydrocodone-Acetaminophen] Nausea And Vomiting  . Motrin [Ibuprofen]      PERTINENT MEDICATIONS:  Outpatient Encounter Medications as of 02/03/2021  Medication Sig  . acetaminophen (TYLENOL) 500 MG tablet Take 2 tablets (1,000 mg total) by mouth every 8 (eight) hours.  .Marland Kitchenalbuterol (PROVENTIL HFA;VENTOLIN HFA) 108 (90 BASE) MCG/ACT inhaler Inhale 2 puffs into the lungs every 6 (six) hours as needed for wheezing or shortness of breath.  . ALPRAZolam (XANAX) 0.5 MG tablet Take 0.5 mg by mouth 2 (two) times daily.   .Marland KitchenamLODipine (NORVASC) 5 MG tablet Take 1 tablet (5 mg total) by mouth daily.  . budesonide-formoterol (SYMBICORT) 160-4.5 MCG/ACT inhaler Inhale 2 puffs into the lungs 2 (two) times daily.  . cholecalciferol (VITAMIN D) 1000 UNITS tablet Take 1,000 Units by mouth daily.  . feeding supplement, ENSURE ENLIVE, (ENSURE ENLIVE) LIQD Take 237 mLs by mouth 2 (two) times daily between meals.  . fluticasone (FLONASE) 50 MCG/ACT nasal spray Place 2 sprays into both  nostrils at bedtime.   . hydroxypropyl methylcellulose / hypromellose (ISOPTO TEARS / GONIOVISC) 2.5 % ophthalmic solution Place 1 drop into both eyes 3 (three) times daily as needed for dry eyes.  Marland Kitchen leflunomide (ARAVA) 20 MG tablet Take 1 tablet (20 mg total) by mouth daily. Resume 2 weeks after surgery. (Patient taking differently: Take 20 mg by mouth daily.)  . metoprolol tartrate (LOPRESSOR) 25 MG tablet Take 1 tablet (25 mg total) by mouth 2 (two) times daily.  . Multiple Vitamin (MULTIVITAMIN WITH MINERALS) TABS tablet Take 1 tablet by mouth  daily.  . ramipril (ALTACE) 10 MG capsule Take 20 mg by mouth daily.  Marland Kitchen senna-docusate (SENOKOT-S) 8.6-50 MG tablet Take 1 tablet by mouth at bedtime as needed for mild constipation.  . traMADol (ULTRAM) 50 MG tablet Take 1-2 tablets (50-100 mg total) by mouth every 6 (six) hours as needed for moderate pain or severe pain.   No facility-administered encounter medications on file as of 02/03/2021.    Thank you for the opportunity to participate in the care of Ms. Charlaine Dalton. The palliative care team will continue to follow. Please call our office at [phone] if we can be of additional assistance.  Jari Favre, DNP, AGPCNP-BC

## 2021-02-15 ENCOUNTER — Emergency Department (HOSPITAL_COMMUNITY): Payer: Medicare HMO

## 2021-02-15 ENCOUNTER — Inpatient Hospital Stay (HOSPITAL_COMMUNITY): Payer: Medicare HMO

## 2021-02-15 ENCOUNTER — Inpatient Hospital Stay (HOSPITAL_COMMUNITY)
Admission: EM | Admit: 2021-02-15 | Discharge: 2021-02-17 | DRG: 065 | Disposition: A | Discharge status: Home Health | Payer: Medicare HMO | Attending: Family Medicine | Admitting: Family Medicine

## 2021-02-15 ENCOUNTER — Other Ambulatory Visit: Payer: Self-pay

## 2021-02-15 ENCOUNTER — Encounter (HOSPITAL_COMMUNITY): Payer: Self-pay | Admitting: Emergency Medicine

## 2021-02-15 DIAGNOSIS — Z87891 Personal history of nicotine dependence: Secondary | ICD-10-CM

## 2021-02-15 DIAGNOSIS — S50812A Abrasion of left forearm, initial encounter: Secondary | ICD-10-CM | POA: Diagnosis present

## 2021-02-15 DIAGNOSIS — I63412 Cerebral infarction due to embolism of left middle cerebral artery: Principal | ICD-10-CM | POA: Diagnosis present

## 2021-02-15 DIAGNOSIS — Z96641 Presence of right artificial hip joint: Secondary | ICD-10-CM | POA: Diagnosis present

## 2021-02-15 DIAGNOSIS — Z7951 Long term (current) use of inhaled steroids: Secondary | ICD-10-CM

## 2021-02-15 DIAGNOSIS — Z885 Allergy status to narcotic agent status: Secondary | ICD-10-CM | POA: Diagnosis not present

## 2021-02-15 DIAGNOSIS — Z23 Encounter for immunization: Secondary | ICD-10-CM

## 2021-02-15 DIAGNOSIS — F419 Anxiety disorder, unspecified: Secondary | ICD-10-CM | POA: Diagnosis not present

## 2021-02-15 DIAGNOSIS — Z961 Presence of intraocular lens: Secondary | ICD-10-CM | POA: Diagnosis present

## 2021-02-15 DIAGNOSIS — J449 Chronic obstructive pulmonary disease, unspecified: Secondary | ICD-10-CM | POA: Diagnosis not present

## 2021-02-15 DIAGNOSIS — R29705 NIHSS score 5: Secondary | ICD-10-CM | POA: Diagnosis present

## 2021-02-15 DIAGNOSIS — G459 Transient cerebral ischemic attack, unspecified: Secondary | ICD-10-CM

## 2021-02-15 DIAGNOSIS — M069 Rheumatoid arthritis, unspecified: Secondary | ICD-10-CM | POA: Diagnosis present

## 2021-02-15 DIAGNOSIS — Y92009 Unspecified place in unspecified non-institutional (private) residence as the place of occurrence of the external cause: Secondary | ICD-10-CM

## 2021-02-15 DIAGNOSIS — Z79899 Other long term (current) drug therapy: Secondary | ICD-10-CM

## 2021-02-15 DIAGNOSIS — Z888 Allergy status to other drugs, medicaments and biological substances status: Secondary | ICD-10-CM

## 2021-02-15 DIAGNOSIS — S50811A Abrasion of right forearm, initial encounter: Secondary | ICD-10-CM | POA: Diagnosis present

## 2021-02-15 DIAGNOSIS — M25511 Pain in right shoulder: Secondary | ICD-10-CM | POA: Diagnosis present

## 2021-02-15 DIAGNOSIS — F4321 Adjustment disorder with depressed mood: Secondary | ICD-10-CM | POA: Diagnosis present

## 2021-02-15 DIAGNOSIS — Z9841 Cataract extraction status, right eye: Secondary | ICD-10-CM

## 2021-02-15 DIAGNOSIS — I10 Essential (primary) hypertension: Secondary | ICD-10-CM | POA: Diagnosis present

## 2021-02-15 DIAGNOSIS — Z882 Allergy status to sulfonamides status: Secondary | ICD-10-CM | POA: Diagnosis not present

## 2021-02-15 DIAGNOSIS — R54 Age-related physical debility: Secondary | ICD-10-CM | POA: Diagnosis present

## 2021-02-15 DIAGNOSIS — Z9842 Cataract extraction status, left eye: Secondary | ICD-10-CM | POA: Diagnosis not present

## 2021-02-15 DIAGNOSIS — R471 Dysarthria and anarthria: Secondary | ICD-10-CM | POA: Diagnosis present

## 2021-02-15 DIAGNOSIS — M79601 Pain in right arm: Secondary | ICD-10-CM | POA: Diagnosis present

## 2021-02-15 DIAGNOSIS — Z9981 Dependence on supplemental oxygen: Secondary | ICD-10-CM | POA: Diagnosis not present

## 2021-02-15 DIAGNOSIS — Z8739 Personal history of other diseases of the musculoskeletal system and connective tissue: Secondary | ICD-10-CM

## 2021-02-15 DIAGNOSIS — Z66 Do not resuscitate: Secondary | ICD-10-CM | POA: Diagnosis present

## 2021-02-15 DIAGNOSIS — W19XXXA Unspecified fall, initial encounter: Secondary | ICD-10-CM | POA: Diagnosis present

## 2021-02-15 DIAGNOSIS — I6389 Other cerebral infarction: Secondary | ICD-10-CM | POA: Diagnosis not present

## 2021-02-15 DIAGNOSIS — G8191 Hemiplegia, unspecified affecting right dominant side: Secondary | ICD-10-CM | POA: Diagnosis present

## 2021-02-15 DIAGNOSIS — F411 Generalized anxiety disorder: Secondary | ICD-10-CM | POA: Diagnosis present

## 2021-02-15 DIAGNOSIS — Z96653 Presence of artificial knee joint, bilateral: Secondary | ICD-10-CM | POA: Diagnosis present

## 2021-02-15 DIAGNOSIS — I639 Cerebral infarction, unspecified: Secondary | ICD-10-CM | POA: Diagnosis not present

## 2021-02-15 DIAGNOSIS — I6523 Occlusion and stenosis of bilateral carotid arteries: Secondary | ICD-10-CM | POA: Diagnosis present

## 2021-02-15 DIAGNOSIS — I739 Peripheral vascular disease, unspecified: Secondary | ICD-10-CM | POA: Diagnosis present

## 2021-02-15 DIAGNOSIS — Z8249 Family history of ischemic heart disease and other diseases of the circulatory system: Secondary | ICD-10-CM

## 2021-02-15 DIAGNOSIS — Z20822 Contact with and (suspected) exposure to covid-19: Secondary | ICD-10-CM | POA: Diagnosis present

## 2021-02-15 DIAGNOSIS — Z880 Allergy status to penicillin: Secondary | ICD-10-CM | POA: Diagnosis not present

## 2021-02-15 DIAGNOSIS — S51019A Laceration without foreign body of unspecified elbow, initial encounter: Secondary | ICD-10-CM

## 2021-02-15 DIAGNOSIS — S80212A Abrasion, left knee, initial encounter: Secondary | ICD-10-CM | POA: Diagnosis present

## 2021-02-15 DIAGNOSIS — S80211A Abrasion, right knee, initial encounter: Secondary | ICD-10-CM | POA: Diagnosis present

## 2021-02-15 LAB — COMPREHENSIVE METABOLIC PANEL
ALT: 13 U/L (ref 0–44)
AST: 18 U/L (ref 15–41)
Albumin: 3.7 g/dL (ref 3.5–5.0)
Alkaline Phosphatase: 68 U/L (ref 38–126)
Anion gap: 10 (ref 5–15)
BUN: 14 mg/dL (ref 8–23)
CO2: 22 mmol/L (ref 22–32)
Calcium: 9.2 mg/dL (ref 8.9–10.3)
Chloride: 104 mmol/L (ref 98–111)
Creatinine, Ser: 0.78 mg/dL (ref 0.44–1.00)
GFR, Estimated: >60 mL/min (ref >60)
Glucose, Bld: 109 mg/dL — ABNORMAL HIGH (ref 70–99)
Potassium: 3.9 mmol/L (ref 3.5–5.1)
Sodium: 136 mmol/L (ref 135–145)
Total Bilirubin: 0.8 mg/dL (ref 0.3–1.2)
Total Protein: 6.9 g/dL (ref 6.5–8.1)

## 2021-02-15 LAB — LIPID PANEL
Cholesterol: 173 mg/dL (ref 0–200)
HDL: 60 mg/dL (ref >40)
LDL Cholesterol: 94 mg/dL (ref 0–99)
Total CHOL/HDL Ratio: 2.9 RATIO
Triglycerides: 97 mg/dL (ref <150)
VLDL: 19 mg/dL (ref 0–40)

## 2021-02-15 LAB — RESP PANEL BY RT-PCR (FLU A&B, COVID) ARPGX2
Influenza A by PCR: NEGATIVE
Influenza B by PCR: NEGATIVE
SARS Coronavirus 2 by RT PCR: NEGATIVE

## 2021-02-15 LAB — DIFFERENTIAL
Abs Immature Granulocytes: 0.02 10*3/uL (ref 0.00–0.07)
Basophils Absolute: 0 10*3/uL (ref 0.0–0.1)
Basophils Relative: 1 %
Eosinophils Absolute: 0.1 10*3/uL (ref 0.0–0.5)
Eosinophils Relative: 2 %
Immature Granulocytes: 0 %
Lymphocytes Relative: 21 %
Lymphs Abs: 1.3 10*3/uL (ref 0.7–4.0)
Monocytes Absolute: 0.9 10*3/uL (ref 0.1–1.0)
Monocytes Relative: 14 %
Neutro Abs: 3.8 10*3/uL (ref 1.7–7.7)
Neutrophils Relative %: 62 %

## 2021-02-15 LAB — CBC
HCT: 36.4 % (ref 36.0–46.0)
Hemoglobin: 11.3 g/dL — ABNORMAL LOW (ref 12.0–15.0)
MCH: 28.5 pg (ref 26.0–34.0)
MCHC: 31 g/dL (ref 30.0–36.0)
MCV: 91.9 fL (ref 80.0–100.0)
Platelets: 230 10*3/uL (ref 150–400)
RBC: 3.96 MIL/uL (ref 3.87–5.11)
RDW: 16.3 % — ABNORMAL HIGH (ref 11.5–15.5)
WBC: 6.2 10*3/uL (ref 4.0–10.5)
nRBC: 0 % (ref 0.0–0.2)

## 2021-02-15 LAB — I-STAT CHEM 8, ED
BUN: 13 mg/dL (ref 8–23)
Calcium, Ion: 0.73 mmol/L — CL (ref 1.15–1.40)
Chloride: 106 mmol/L (ref 98–111)
Creatinine, Ser: 0.8 mg/dL (ref 0.44–1.00)
Glucose, Bld: 106 mg/dL — ABNORMAL HIGH (ref 70–99)
HCT: 36 % (ref 36.0–46.0)
Hemoglobin: 12.2 g/dL (ref 12.0–15.0)
Potassium: 4 mmol/L (ref 3.5–5.1)
Sodium: 138 mmol/L (ref 135–145)
TCO2: 24 mmol/L (ref 22–32)

## 2021-02-15 LAB — VITAMIN D 25 HYDROXY (VIT D DEFICIENCY, FRACTURES): Vit D, 25-Hydroxy: 107.2 ng/mL — ABNORMAL HIGH (ref 30–100)

## 2021-02-15 LAB — HEMOGLOBIN A1C
Hgb A1c MFr Bld: 4.9 % (ref 4.8–5.6)
Mean Plasma Glucose: 93.93 mg/dL

## 2021-02-15 LAB — ETHANOL: Alcohol, Ethyl (B): <10 mg/dL (ref <10)

## 2021-02-15 LAB — PROTIME-INR
INR: 1 (ref 0.8–1.2)
Prothrombin Time: 12.8 seconds (ref 11.4–15.2)

## 2021-02-15 LAB — CBG MONITORING, ED: Glucose-Capillary: 93 mg/dL (ref 70–99)

## 2021-02-15 LAB — APTT: aPTT: 27 seconds (ref 24–36)

## 2021-02-15 MED ORDER — DOUBLE ANTIBIOTIC 500-10000 UNIT/GM EX OINT
TOPICAL_OINTMENT | Freq: Once | CUTANEOUS | Status: AC
  Filled 2021-02-15: qty 4

## 2021-02-15 MED ORDER — ACETAMINOPHEN 650 MG RE SUPP: 650.0000 mg | RECTAL | Status: DC | PRN

## 2021-02-15 MED ORDER — IOHEXOL 350 MG/ML SOLN
75.0000 mL | Freq: Once | INTRAVENOUS | Status: AC | PRN
  Administered 2021-02-15: 75 mL via INTRAVENOUS

## 2021-02-15 MED ORDER — ALPRAZOLAM 0.5 MG PO TABS
0.5000 mg | ORAL_TABLET | Freq: Every day | ORAL | Status: DC
  Administered 2021-02-15 – 2021-02-16 (×2): 0.5 mg via ORAL
  Filled 2021-02-15 (×2): qty 1

## 2021-02-15 MED ORDER — ALPRAZOLAM 0.25 MG PO TABS
0.2500 mg | ORAL_TABLET | Freq: Every day | ORAL | Status: DC
  Administered 2021-02-15: 0.25 mg via ORAL
  Filled 2021-02-15: qty 1

## 2021-02-15 MED ORDER — FLUTICASONE PROPIONATE 50 MCG/ACT NA SUSP
2.0000 | Freq: Every day | NASAL | Status: DC
  Administered 2021-02-15 – 2021-02-16 (×2): 2 via NASAL
  Filled 2021-02-15 (×2): qty 16

## 2021-02-15 MED ORDER — HYPROMELLOSE (GONIOSCOPIC) 2.5 % OP SOLN: 1.0000 [drp] | Freq: Three times a day (TID) | OPHTHALMIC | Status: DC | PRN

## 2021-02-15 MED ORDER — ASPIRIN 81 MG PO CHEW
324.0000 mg | CHEWABLE_TABLET | Freq: Once | ORAL | Status: AC
  Administered 2021-02-15: 324 mg via ORAL
  Filled 2021-02-15: qty 4

## 2021-02-15 MED ORDER — SENNOSIDES-DOCUSATE SODIUM 8.6-50 MG PO TABS: 1.0000 | ORAL_TABLET | Freq: Every evening | ORAL | Status: DC | PRN

## 2021-02-15 MED ORDER — ACETAMINOPHEN 325 MG PO TABS
650.0000 mg | ORAL_TABLET | ORAL | Status: DC | PRN
Start: 2021-02-15 — End: 2021-02-17

## 2021-02-15 MED ORDER — LEFLUNOMIDE 20 MG PO TABS
20.0000 mg | ORAL_TABLET | Freq: Every day | ORAL | Status: DC
  Administered 2021-02-16 – 2021-02-17 (×2): 20 mg via ORAL
  Filled 2021-02-15 (×3): qty 1

## 2021-02-15 MED ORDER — SODIUM CHLORIDE 0.9 % IV SOLN
INTRAVENOUS | Status: AC
  Administered 2021-02-15: 500 mL via INTRAVENOUS

## 2021-02-15 MED ORDER — ACETAMINOPHEN 160 MG/5ML PO SOLN: 650.0000 mg | ORAL | Status: DC | PRN

## 2021-02-15 MED ORDER — ALBUTEROL SULFATE (2.5 MG/3ML) 0.083% IN NEBU: 2.5000 mg | INHALATION_SOLUTION | Freq: Four times a day (QID) | RESPIRATORY_TRACT | Status: DC | PRN

## 2021-02-15 MED ORDER — MOMETASONE FURO-FORMOTEROL FUM 200-5 MCG/ACT IN AERO
2.0000 | INHALATION_SPRAY | Freq: Two times a day (BID) | RESPIRATORY_TRACT | Status: DC
  Administered 2021-02-15 – 2021-02-17 (×5): 2 via RESPIRATORY_TRACT
  Filled 2021-02-15: qty 8.8

## 2021-02-15 MED ORDER — ASPIRIN 300 MG RE SUPP: 300.0000 mg | Freq: Every day | RECTAL | Status: DC

## 2021-02-15 MED ORDER — POLYVINYL ALCOHOL 1.4 % OP SOLN: 1.0000 [drp] | Freq: Three times a day (TID) | OPHTHALMIC | Status: DC | PRN

## 2021-02-15 MED ORDER — ENOXAPARIN SODIUM 30 MG/0.3ML ~~LOC~~ SOLN
30.0000 mg | SUBCUTANEOUS | Status: DC
  Administered 2021-02-16 – 2021-02-17 (×2): 30 mg via SUBCUTANEOUS
  Filled 2021-02-15 (×2): qty 0.3

## 2021-02-15 MED ORDER — METOPROLOL TARTRATE 25 MG PO TABS: 25.0000 mg | ORAL_TABLET | Freq: Two times a day (BID) | ORAL | Status: DC

## 2021-02-15 MED ORDER — PANTOPRAZOLE SODIUM 40 MG PO TBEC
40.0000 mg | DELAYED_RELEASE_TABLET | Freq: Every day | ORAL | Status: DC
  Administered 2021-02-15 – 2021-02-17 (×3): 40 mg via ORAL
  Filled 2021-02-15 (×3): qty 1

## 2021-02-15 MED ORDER — ALBUTEROL SULFATE HFA 108 (90 BASE) MCG/ACT IN AERS: 2.0000 | INHALATION_SPRAY | Freq: Four times a day (QID) | RESPIRATORY_TRACT | Status: DC | PRN

## 2021-02-15 MED ORDER — ASPIRIN 325 MG PO TABS
325.0000 mg | ORAL_TABLET | Freq: Every day | ORAL | Status: DC
  Administered 2021-02-16 – 2021-02-17 (×2): 325 mg via ORAL
  Filled 2021-02-15 (×2): qty 1

## 2021-02-15 MED ORDER — TETANUS-DIPHTH-ACELL PERTUSSIS 5-2.5-18.5 LF-MCG/0.5 IM SUSY
0.5000 mL | PREFILLED_SYRINGE | Freq: Once | INTRAMUSCULAR | Status: AC
  Administered 2021-02-15: 0.5 mL via INTRAMUSCULAR
  Filled 2021-02-15: qty 0.5

## 2021-02-15 MED ORDER — ALPRAZOLAM 0.25 MG PO TABS
0.2500 mg | ORAL_TABLET | Freq: Every day | ORAL | Status: DC | PRN
  Administered 2021-02-16: 0.25 mg via ORAL
  Filled 2021-02-15: qty 1

## 2021-02-15 MED ORDER — ENSURE ENLIVE PO LIQD
237.0000 mL | Freq: Two times a day (BID) | ORAL | Status: DC
  Administered 2021-02-15 – 2021-02-17 (×6): 237 mL via ORAL
  Filled 2021-02-15 (×4): qty 237

## 2021-02-15 NOTE — Progress Notes (Signed)
Patient noted to still be ST with my second set of vitals.  Pt does not endorse CP, SOB, or palpitations.  Per protocol Dr. Carren Rang notified.  No new orders at this time

## 2021-02-15 NOTE — ED Notes (Signed)
Patient had some visual changes in right eye. Patients modified NIH now 6. DR Laural Benes made aware.

## 2021-02-15 NOTE — ED Notes (Signed)
Patient has skin tear to left and right upper arm/elbow and skin tears to knees bilateral. All wounds cleaned with NS. Bacitracin applied with xeroform, telfa, and kerlix to arms bilaterally; ace bandages to knees bilaterally. Patient tolerated well.

## 2021-02-15 NOTE — Plan of Care (Signed)
  Problem: Education: Goal: Knowledge of General Education information will improve Description Including pain rating scale, medication(s)/side effects and non-pharmacologic comfort measures Outcome: Progressing   Problem: Health Behavior/Discharge Planning: Goal: Ability to manage health-related needs will improve Outcome: Progressing   

## 2021-02-15 NOTE — ED Notes (Signed)
Date and time results received: 02/15/21 0728 (use smartphrase ".now" to insert current time)  Test: iCa  Critical Value: 0.73  Name of Provider Notified: Dr. Hyacinth Meeker  Orders Received? Or Actions Taken?: See orders

## 2021-02-15 NOTE — ED Provider Notes (Signed)
[facility] EMERGENCY DEPARTMENT Provider Note   CSN: [id number] Arrival date & time: 02/15/21  7829     History Chief Complaint  Patient presents with  . Fall    [name] [name] is a 87 y.o. female.  HPI   This patient is an 87 year old female, very pleasant, she has COPD for which she uses 2 L of oxygen at night by nasal cannula, she does not use oxygen during the day.  She also has a history of hypertension and walks with a walker.  She has rheumatoid arthritis.  Chief complaint is a complaint of having a fall this morning when she woke up.  She reports that when she tried to get out of bed about 2 hours ago she could not move her right side very well and could not walk, she usually walks with a walker without any difficulty but she fell trying to walk and fell into her bedside commode.  This is very unusual for her, she noticed that her right arm and right leg were not working right.  She suffered abrasions and lacerations to the bilateral arms just above the elbows and the bilateral knees.  She denies hitting her head, and denies any visual changes, denies any upper or lower extremity numbness, denies any difficulty with speech.  The fall was acute in onset, occurred 2 hours ago, her symptoms are persistent, she has not had any improvement in the weakness of her arms or legs, she has had no medications prior to arrival, the paramedics that came to get her did wrap her with a sterile dressing on each of the 4 skin tears that she has.  Last seen normal was 10:00 last night, when the patient woke up at 5 AM she could not move the right side very well.  She does not take any anticoagulants.  I have reviewed the patient's medical record including her medications and recent visits to her doctor, she does have palliative care home visits   Teleneurology was consulted, I received a call at 7:15 AM from the telespecialist RN, they are coordinating the evaluation by the neurologist  Past  Medical History:  Diagnosis Date  . Anxiety   . Arthritis    rheumatoid  . COPD (chronic obstructive pulmonary disease) (HCC)   . Fall   . Hypertension     Patient Active Problem List   Diagnosis Date Noted  . Acute CVA (cerebrovascular accident) (HCC) 02/15/2021  . Postoperative fever 01/03/2019  . Hypokalemia 01/03/2019  . History of rheumatoid arthritis 01/03/2019  . Closed right hip fracture, initial encounter (HCC) 12/31/2018  . Essential hypertension 06/27/2018  . COPD (chronic obstructive pulmonary disease) (HCC) 06/27/2018  . Anxiety 06/27/2018  . Headache 06/27/2018    Past Surgical History:  Procedure Laterality Date  . APPENDECTOMY    . CATARACT EXTRACTION W/PHACO Right 11/27/2013   Procedure: CATARACT EXTRACTION PHACO AND INTRAOCULAR LENS PLACEMENT (IOC);  Surgeon: Loraine Leriche T. Nile Riggs, MD;  Location: [facility];  Service: Ophthalmology;  Laterality: Right;  CDE:7.31  . CATARACT EXTRACTION W/PHACO Left 12/11/2013   Procedure: CATARACT EXTRACTION PHACO AND INTRAOCULAR LENS PLACEMENT (IOC);  Surgeon: Loraine Leriche T. Nile Riggs, MD;  Location: [facility];  Service: Ophthalmology;  Laterality: Left;  CDE:7.94  . HIP ARTHROPLASTY Right 01/02/2019   Procedure: ARTHROPLASTY BIPOLAR HIP (HEMIARTHROPLASTY);  Surgeon: Durene Romans, MD;  Location: [facility];  Service: Orthopedics;  Laterality: Right;  . HUMERUS FRACTURE SURGERY Left    had surgery x3 on it.  . REPLACEMENT  TOTAL KNEE BILATERAL       OB History   No obstetric history on file.     Family History  Problem Relation Age of Onset  . Hypertension Other     Social History   Tobacco Use  . Smoking status: Former Smoker    Packs/day: 2.00    Years: 30.00    Pack years: 60.00    Types: Cigarettes  . Smokeless tobacco: Former Neurosurgeon    Quit date: 11/22/1997  Vaping Use  . Vaping Use: Never used  Substance Use Topics  . Alcohol use: Yes    Comment: 1 mixed drink at night  . Drug use: No    Home Medications Prior to Admission  medications   Medication Sig Start Date End Date Taking? Authorizing Provider  acetaminophen (TYLENOL) 500 MG tablet Take 2 tablets (1,000 mg total) by mouth every 8 (eight) hours. 01/02/19   [name], PA-C  albuterol (PROVENTIL HFA;VENTOLIN HFA) 108 (90 BASE) MCG/ACT inhaler Inhale 2 puffs into the lungs every 6 (six) hours as needed for wheezing or shortness of breath.    [provider]  ALPRAZolam Prudy Feeler) 0.5 MG tablet Take 0.5 mg by mouth 2 (two) times daily.     [provider]  amLODipine (NORVASC) 5 MG tablet Take 1 tablet (5 mg total) by mouth daily. 06/29/18 06/27/19  Johnson, Clanford L, MD  budesonide-formoterol (SYMBICORT) 160-4.5 MCG/ACT inhaler Inhale 2 puffs into the lungs 2 (two) times daily. 01/30/18   [provider]  cholecalciferol (VITAMIN D) 1000 UNITS tablet Take 1,000 Units by mouth daily.    [provider]  feeding supplement, ENSURE ENLIVE, (ENSURE ENLIVE) LIQD Take 237 mLs by mouth 2 (two) times daily between meals. 01/08/19   Almon Hercules, MD  fluticasone (FLONASE) 50 MCG/ACT nasal spray Place 2 sprays into both nostrils at bedtime.  10/12/17   [provider]  hydroxypropyl methylcellulose / hypromellose (ISOPTO TEARS / GONIOVISC) 2.5 % ophthalmic solution Place 1 drop into both eyes 3 (three) times daily as needed for dry eyes.    [provider]  leflunomide (ARAVA) 20 MG tablet Take 1 tablet (20 mg total) by mouth daily. Resume 2 weeks after surgery. Patient taking differently: Take 20 mg by mouth daily. 01/02/19   [name], PA-C  metoprolol tartrate (LOPRESSOR) 25 MG tablet Take 1 tablet (25 mg total) by mouth 2 (two) times daily. 06/28/18 06/27/19  Cleora Fleet, MD  Multiple Vitamin (MULTIVITAMIN WITH MINERALS) TABS tablet Take 1 tablet by mouth daily.    [provider]  ramipril (ALTACE) 10 MG capsule Take 20 mg by mouth daily.    [provider]  senna-docusate (SENOKOT-S)  8.6-50 MG tablet Take 1 tablet by mouth at bedtime as needed for mild constipation. 01/08/19   Almon Hercules, MD  traMADol (ULTRAM) 50 MG tablet Take 1-2 tablets (50-100 mg total) by mouth every 6 (six) hours as needed for moderate pain or severe pain. 01/08/19   [name], PA-C    Allergies    Penicillins, Sulfa antibiotics, Cortisone, Morphine and related, Vicodin [hydrocodone-acetaminophen], and Motrin [ibuprofen]  Review of Systems   Review of Systems  All other systems reviewed and are negative.   Physical Exam Updated Vital Signs BP (!) 149/81   Pulse 88   Temp 98.4 F (36.9 C) (Oral)   Resp 18   Ht 1.549 m ( )   Wt 49.9 kg   SpO2 93%   BMI 20.79  kg/m   Physical Exam Vitals and nursing note reviewed.  Constitutional:      General: She is not in acute distress.    Appearance: She is well-developed.  HENT:     Head: Normocephalic and atraumatic.     Nose: Nose normal.     Mouth/Throat:     Mouth: Mucous membranes are moist.     Pharynx: No oropharyngeal exudate.  Eyes:     General: No scleral icterus.       Right eye: No discharge.        Left eye: No discharge.     Conjunctiva/sclera: Conjunctivae normal.     Pupils: Pupils are equal, round, and reactive to light.  Neck:     Thyroid: No thyromegaly.     Vascular: No JVD.  Cardiovascular:     Rate and Rhythm: Normal rate and regular rhythm.     Heart sounds: Normal heart sounds. No murmur heard. No friction rub. No gallop.   Pulmonary:     Effort: Pulmonary effort is normal. No respiratory distress.     Breath sounds: Normal breath sounds. No wheezing or rales.     Comments: Slight bruise over the left clavicle Chest:     Chest wall: Tenderness present.  Abdominal:     General: Bowel sounds are normal. There is no distension.     Palpations: Abdomen is soft. There is no mass.     Tenderness: There is no abdominal tenderness.  Musculoskeletal:        General: Tenderness and signs of injury  present. Normal range of motion.     Cervical back: Normal range of motion and neck supple.     Right lower leg: No edema.     Left lower leg: No edema.     Comments: Compartments are diffusely soft, joints are diffusely supple  Lymphadenopathy:     Cervical: No cervical adenopathy.  Skin:    General: Skin is warm and dry.     Findings: Bruising present. No erythema or rash.     Comments: There is skin tears just above the elbow bilaterally, over the patellas bilaterally and a healing skin wound over the dorsal lateral foot on the right  Neurological:     Mental Status: She is alert.     Coordination: Coordination normal.     Comments: The patient has weakness in the right upper extremity 4 out of 5 compared to left which is 5 out of 5.  Lower extremity is 4 out of 5 compared to the left which is 5 out of 5.  She has a slight decrease sensation on the right side.  There is no neglect.  She has normal speech, normal visual acuity grossly, cranial nerves III through XII are normal except for right facial droop.  She has dysmetria with the right upper extremity  Psychiatric:        Behavior: Behavior normal.     ED Results / Procedures / Treatments   Labs (all labs ordered are listed, but only abnormal results are displayed) Labs Reviewed  CBC - Abnormal; Notable for the following components:      Result Value   Hemoglobin 11.3 (*)    RDW 16.3 (*)    All other components within normal limits  COMPREHENSIVE METABOLIC PANEL - Abnormal; Notable for the following components:   Glucose, Bld 109 (*)    All other components within normal limits  I-STAT CHEM 8, ED - Abnormal; Notable for the  following components:   Glucose, Bld 106 (*)    Calcium, Ion 0.73 (*)    All other components within normal limits  RESP PANEL BY RT-PCR (FLU A&B, COVID) ARPGX2  ETHANOL  PROTIME-INR  APTT  DIFFERENTIAL  RAPID URINE DRUG SCREEN, HOSP PERFORMED  URINALYSIS, ROUTINE W REFLEX MICROSCOPIC  CBG  MONITORING, ED    EKG EKG Interpretation  Date/Time:  Sunday February 15 2021 07:16:36 EDT Ventricular Rate:  90 PR Interval:  183 QRS Duration: 76 QT Interval:  341 QTC Calculation: 418 R Axis:   72 Text Interpretation: Sinus rhythm Low voltage, precordial leads ECG OTHERWISE WITHIN NORMAL LIMITS Confirmed by Eber Hong ([id number]) on 02/15/2021 7:41:35 AM   Radiology CT HEAD WO CONTRAST  Result Date: 02/15/2021 CLINICAL DATA:  87 year old female status post fall at home. Pain, neurologic deficit with right side weakness. EXAM: CT HEAD WITHOUT CONTRAST TECHNIQUE: Contiguous axial images were obtained from the base of the skull through the vertex without intravenous contrast. COMPARISON:  Head CT without contrast 12/17/2020. FINDINGS: Brain: No midline shift, ventriculomegaly, mass effect, evidence of mass lesion, acute intracranial hemorrhage. Stable cerebral volume. Stable Patchy and confluent bilateral white matter hypodensity. Deep gray nuclei, brainstem and cerebellum appear relatively spared. There are punctate new calcified foci along the left middle and superior frontal gyrus (coronal image 40) which were not present in February. No acute cortically based infarct is evident in the region. Vascular: Calcified atherosclerosis at the skull base. No suspicious intracranial vascular hyperdensity. Skull: No acute osseous abnormality identified. Sinuses/Orbits: Visualized paranasal sinuses and mastoids are stable and well pneumatized. Other: No orbit or scalp soft tissue injury. IMPRESSION: 1. No acute acute traumatic injury identified. 2. Advanced chronic white matter disease with no acute cortically based infarct identified, but there are two punctate calcified foci about the left superior frontal lobe which are new since February and suspicious for thromboembolic plaque in the setting of new right side weakness. Noncontrast Brain MRI would be valuable if feasible. Electronically Signed   By: Odessa Fleming  M.D.   On: 02/15/2021 08:15   DG Chest Port 1 View  Result Date: 02/15/2021 CLINICAL DATA:  87 year old female status post fall at home. Pain, neurologic deficit with right side weakness. EXAM: PORTABLE CHEST 1 VIEW COMPARISON:  CTA chest 12/17/2020 and earlier. FINDINGS: Portable AP upright view at 0732 hours. Lung volumes and mediastinal contours are stable and within normal limits. Calcified aortic atherosclerosis. Visualized tracheal air column is within normal limits. Allowing for portable technique the lungs are clear. No pneumothorax. Stable visualized osseous structures. Right upper extremity calcified atherosclerosis. Negative visible bowel gas. IMPRESSION: No acute cardiopulmonary abnormality or acute traumatic injury identified. Electronically Signed   By: Odessa Fleming M.D.   On: 02/15/2021 08:16   DG Humerus Left  Result Date: 02/15/2021 CLINICAL DATA:  87 year old female status post fall at home. Pain, neurologic deficit with right side weakness. EXAM: LEFT HUMERUS - 2+ VIEW COMPARISON:  Chest CTA 12/17/2020. FINDINGS: Previous left humerus ORIF with hardware to the distal 3rd humerus shaft, stable from February. Healed humeral neck region fracture. Underlying osteopenia. Alignment appears maintained at the left shoulder and elbow. No acute osseous abnormality identified. Negative visible left chest. IMPRESSION: Previous left humerus ORIF. No acute osseous abnormality identified. Electronically Signed   By: Odessa Fleming M.D.   On: 02/15/2021 08:18   DG Humerus Right  Result Date: 02/15/2021 CLINICAL DATA:  87 year old female status post fall at home. Pain, neurologic deficit with right  side weakness. EXAM: RIGHT HUMERUS - 2+ VIEW COMPARISON:  Right wrist series 12/13/2016. FINDINGS: Calcified peripheral vascular disease. Bone mineralization is within normal limits for age. Alignment appears maintained about the right shoulder and elbow. No right humerus fracture identified. Negative visible right chest.  IMPRESSION: 1. No acute fracture or dislocation identified about the right humerus. 2. Calcified peripheral vascular disease. Electronically Signed   By: Odessa Fleming M.D.   On: 02/15/2021 08:17    Procedures Procedures   Medications Ordered in ED Medications  aspirin chewable tablet 324 mg (has no administration in time range)  polymixin-bacitracin (POLYSPORIN) ointment (has no administration in time range)  Tdap (BOOSTRIX) injection 0.5 mL (0.5 mLs Intramuscular Given 02/15/21 0909)    ED Course  I have reviewed the triage vital signs and the nursing notes.  Pertinent labs & imaging results that were available during my care of the patient were reviewed by me and considered in my medical decision making (see chart for details).  Clinical Course as of 02/15/21 0932  Sun Feb 15, 2021  4098 Teleneuro - recommends no CTA - outside of window for TPA.  Full dose ASA, admit local [BM]    Clinical Course User Index [BM] Eber Hong, MD   MDM Rules/Calculators/A&P                          The patient awoke with neurologic symptoms consistent with having an acute stroke sometime after 10:00 PM.  This was a wake-up stroke, CT scan and CT angiograms have been ordered, neurology consulted immediately, at this time at 7:15 AM all the orders are in, neurology has been consulted patient will go to CT scan.  She will be updated on her tetanus, local wound care with bacitracin and sterile dressings.  The skin tears do not require laceration repair.  They are not actively bleeding - there is no drainage, minimal pain  Xray of chest to eval for chest injury from fall - with clavicle bruis - she has mild pain with breathing over the lower anterior chest - no obvious bruising in that area.    CT scan shows no acute hemorrhagic stroke, likely ischemia, discussed with teleneurology who recommends admission to the hospital, discussed with Dr. Laural Benes who is agreeable to see the patient for admission.  Final  Clinical Impression(s) / ED Diagnoses Final diagnoses:  Acute ischemic stroke [facility])  Skin tear of elbow without complication, unspecified laterality, initial encounter    Rx / DC Orders ED Discharge Orders    None       Eber Hong, MD 02/15/21 [phone]

## 2021-02-15 NOTE — ED Triage Notes (Addendum)
Pt had mechanical fall tonight at home. Pt c/o pain to right arm and shoulder. Pt has multiple abrasions on bilateral arms and knees from falling tonight.

## 2021-02-15 NOTE — H&P (Signed)
History and Physical  [facility]  [name] [name] WCB:[id number] DOB: 1931/12/18 DOA: 02/15/2021  PCP: Richmond Campbell., PA-C  Patient coming from: Home  Level of care: Telemetry  I have personally briefly reviewed patient's old medical records in [facility]  Chief Complaint: Fall at home   HPI: [name] [name] is a 87 y.o. female with medical history significant for rheumatoid arthritis, oxygen dependent COPD from former smoking, hypertension, falls, anxiety disorder who reportedly had a fall at home with this morning and was complaining of right arm and shoulder pain.  She was noted to have multiple abrasions on the arms and knees.  She normally walks with the use of a walker.  She uses nighttime oxygen 2 L/min nasal cannula.  She reported that she fell after waking up early this morning trying to get out of bed.  She reported that for some reason her right side was not working properly.  She felt like her right arms and legs were dead.  She had decreased strength mobility and sensation in her right side.  She fell immediately upon getting up.  She injured her arms and knees. No head injury.  She is not on anticoagulation.  Family reports that she was last normal at 10 PM yesterday evening.  When she woke up at around 5 AM she could not move her right side.  ED Course: Patient was initially worked up for fall with x-rays of the humerus bones with no findings of acute fracture.  CT head without contrast reported advanced chronic white matter disease with no acute cortically based infarct identified, but there are two punctate calcified foci about the left superior frontal lobe which are new since February and suspicious for thromboembolic plaque in the setting of new right side weakness. Noncontrast Brain MRI would be valuable if feasible.  Patient was seen by the telemetry neurology service and they recommended further inpatient work-up including an MRI of brain and MRA and 2D  echocardiogram with bubble study.  They also recommended a full stroke work-up.  Aspirin 325 mg daily was recommended for antiplatelet therapy.  Review of Systems: Review of Systems  Constitutional: Negative.   HENT: Negative.   Eyes: Negative for blurred vision, double vision, photophobia, pain, discharge and redness.  Respiratory: Positive for shortness of breath. Negative for cough, hemoptysis and sputum production.   Cardiovascular: Negative for chest pain, palpitations, orthopnea, claudication, leg swelling and PND.  Gastrointestinal: Negative for abdominal pain, heartburn, nausea and vomiting.  Genitourinary: Negative for dysuria, flank pain, frequency, hematuria and urgency.  Musculoskeletal: Negative for myalgias.  Skin: Negative.   Neurological: Positive for dizziness, sensory change, focal weakness, weakness and headaches. Negative for speech change.  Endo/Heme/Allergies: Negative for environmental allergies and polydipsia. Does not bruise/bleed easily.  Psychiatric/Behavioral: Positive for depression.       Situational depression with recent loss of husband     Past Medical History:  Diagnosis Date  . Anxiety   . Arthritis    rheumatoid  . COPD (chronic obstructive pulmonary disease) (HCC)   . Fall   . Hypertension     Past Surgical History:  Procedure Laterality Date  . APPENDECTOMY    . CATARACT EXTRACTION W/PHACO Right 11/27/2013   Procedure: CATARACT EXTRACTION PHACO AND INTRAOCULAR LENS PLACEMENT (IOC);  Surgeon: Loraine Leriche T. Nile Riggs, MD;  Location: [facility];  Service: Ophthalmology;  Laterality: Right;  CDE:7.31  . CATARACT EXTRACTION W/PHACO Left 12/11/2013   Procedure: CATARACT EXTRACTION PHACO AND INTRAOCULAR  LENS PLACEMENT (IOC);  Surgeon: Loraine Leriche T. Nile Riggs, MD;  Location: [facility];  Service: Ophthalmology;  Laterality: Left;  CDE:7.94  . HIP ARTHROPLASTY Right 01/02/2019   Procedure: ARTHROPLASTY BIPOLAR HIP (HEMIARTHROPLASTY);  Surgeon: Durene Romans, MD;  Location: [facility]  [facility];  Service: Orthopedics;  Laterality: Right;  . HUMERUS FRACTURE SURGERY Left    had surgery x3 on it.  . REPLACEMENT TOTAL KNEE BILATERAL       reports that she has quit smoking. Her smoking use included cigarettes. She has a 60.00 pack-year smoking history. She quit smokeless tobacco use about 23 years ago. She reports current alcohol use. She reports that she does not use drugs.  Allergies  Allergen Reactions  . Penicillins Swelling, Anaphylaxis, Hives and Nausea And Vomiting    Has patient had a PCN reaction causing immediate rash, facial/tongue/throat swelling, SOB or lightheadedness with hypotension: Yes Has patient had a PCN reaction causing severe rash involving mucus membranes or skin necrosis: No Has patient had a PCN reaction that required hospitalization No Has patient had a PCN reaction occurring within the last 10 years: No If all of the above answers are "NO", then may proceed with Cephalosporin use.  Has patient had a PCN reaction causing immediate rash, facial/tongue/throat swelling, SOB or lightheadedness with hypotension: Yes Has patient had a PCN reaction causing severe rash involving mucus membranes or skin necrosis: No Has patient had a PCN reaction that required hospitalization No Has patient had a PCN reaction occurring within the last 10 years: No If all of the above answers are "NO", then may proceed with Cephalosporin use.   . Sulfa Antibiotics Hives  . Cortisone Other (See Comments)    Irregular Heart Beat  . Morphine And Related Nausea And Vomiting  . Vicodin [Hydrocodone-Acetaminophen] Nausea And Vomiting  . Motrin [Ibuprofen]     Family History  Problem Relation Age of Onset  . Hypertension Other     Prior to Admission medications   Medication Sig Start Date End Date Taking? Authorizing Provider  acetaminophen (TYLENOL) 500 MG tablet Take 2 tablets (1,000 mg total) by mouth every 8 (eight) hours. 01/02/19   [name], PA-C  albuterol  (PROVENTIL HFA;VENTOLIN HFA) 108 (90 BASE) MCG/ACT inhaler Inhale 2 puffs into the lungs every 6 (six) hours as needed for wheezing or shortness of breath.    [provider]  ALPRAZolam Prudy Feeler) 0.5 MG tablet Take 0.5 mg by mouth 2 (two) times daily.     [provider]  amLODipine (NORVASC) 5 MG tablet Take 1 tablet (5 mg total) by mouth daily. 06/29/18 06/27/19  Kingsten Enfield L, MD  budesonide-formoterol (SYMBICORT) 160-4.5 MCG/ACT inhaler Inhale 2 puffs into the lungs 2 (two) times daily. 01/30/18   [provider]  cholecalciferol (VITAMIN D) 1000 UNITS tablet Take 1,000 Units by mouth daily.    [provider]  feeding supplement, ENSURE ENLIVE, (ENSURE ENLIVE) LIQD Take 237 mLs by mouth 2 (two) times daily between meals. 01/08/19   Almon Hercules, MD  fluticasone (FLONASE) 50 MCG/ACT nasal spray Place 2 sprays into both nostrils at bedtime.  10/12/17   [provider]  hydroxypropyl methylcellulose / hypromellose (ISOPTO TEARS / GONIOVISC) 2.5 % ophthalmic solution Place 1 drop into both eyes 3 (three) times daily as needed for dry eyes.    [provider]  leflunomide (ARAVA) 20 MG tablet Take 1 tablet (20 mg total) by mouth daily. Resume 2 weeks after surgery. Patient taking differently: Take 20 mg by  mouth daily. 01/02/19   [name], PA-C  metoprolol tartrate (LOPRESSOR) 25 MG tablet Take 1 tablet (25 mg total) by mouth 2 (two) times daily. 06/28/18 06/27/19  Cleora Fleet, MD  Multiple Vitamin (MULTIVITAMIN WITH MINERALS) TABS tablet Take 1 tablet by mouth daily.    [provider]  ramipril (ALTACE) 10 MG capsule Take 20 mg by mouth daily.    [provider]  senna-docusate (SENOKOT-S) 8.6-50 MG tablet Take 1 tablet by mouth at bedtime as needed for mild constipation. 01/08/19   Almon Hercules, MD  traMADol (ULTRAM) 50 MG tablet Take 1-2 tablets (50-100 mg total) by mouth every 6 (six) hours as needed for moderate  pain or severe pain. 01/08/19   [name], PA-C    Physical Exam: Vitals:   02/15/21 0830 02/15/21 0900 02/15/21 0930 02/15/21 1000  BP: (!) 149/81 (!) 148/98 (!) 165/82 (!) 145/89  Pulse: 88 84    Resp: 18 (!) Temp:      TempSrc:      SpO2: 93% 99%    Weight:      Height:        Constitutional: elderly frail female, lying supine in bed, speaking in clear full sentences, NAD, calm, comfortable Eyes: PERRL, lids and conjunctivae normal ENMT: Mucous membranes are dry. Posterior pharynx clear of any exudate or lesions.  Neck: normal, supple, no masses, no thyromegaly Respiratory: clear to auscultation bilaterally, no wheezing, no crackles. Normal respiratory effort. No accessory muscle use.  Cardiovascular: normal s1, s2 sounds, no murmurs / rubs / gallops. No extremity edema. 2+ pedal pulses. No carotid bruits.  Abdomen: no tenderness, no masses palpated. No hepatosplenomegaly. Bowel sounds positive.  Musculoskeletal: no clubbing / cyanosis. No joint deformity upper and lower extremities. Good ROM, no contractures. Normal muscle tone.  Skin: no rashes, lesions, ulcers. No induration Neurologic: CN 2-12 grossly intact. Slightly less sensation right upper extremity, DTR normal. Strength 5/5 in LUE/LLE, 4/5 RUE/RLE.  Psychiatric: Normal judgment and insight. Alert and oriented x 3. Normal mood.   Labs on Admission: I have personally reviewed following labs and imaging studies  CBC: Recent Labs  Lab 02/15/21 0721 02/15/21 0725  WBC 6.2  --   NEUTROABS 3.8  --   HGB 11.3* 12.2  HCT 36.4 36.0  MCV 91.9  --   PLT 230  --    Basic Metabolic Panel: Recent Labs  Lab 02/15/21 0721 02/15/21 0725  NA 136 138  K 3.9 4.0  CL 104 106  CO2 22  --   GLUCOSE 109* 106*  BUN 14 13  CREATININE 0.78 0.80  CALCIUM 9.2  --    GFR: Estimated Creatinine Clearance: 38.1 mL/min (by C-G formula based on SCr of 0.8 mg/dL). Liver Function Tests: Recent Labs  Lab  02/15/21 0721  AST 18  ALT 13  ALKPHOS 68  BILITOT 0.8  PROT 6.9  ALBUMIN 3.7   No results for input(s): LIPASE, AMYLASE in the last 168 hours. No results for input(s): AMMONIA in the last 168 hours. Coagulation Profile: Recent Labs  Lab 02/15/21 0721  INR 1.0   Cardiac Enzymes: No results for input(s): CKTOTAL, CKMB, CKMBINDEX, TROPONINI in the last 168 hours. BNP (last 3 results) No results for input(s): PROBNP in the last 8760 hours. HbA1C: No results for input(s): HGBA1C in the last 72 hours. CBG: Recent Labs  Lab 02/15/21 0725  GLUCAP 93   Lipid Profile: No results for input(s): CHOL, HDL,  LDLCALC, TRIG, CHOLHDL, LDLDIRECT in the last 72 hours. Thyroid Function Tests: No results for input(s): TSH, T4TOTAL, FREET4, T3FREE, THYROIDAB in the last 72 hours. Anemia Panel: No results for input(s): VITAMINB12, FOLATE, FERRITIN, TIBC, IRON, RETICCTPCT in the last 72 hours. Urine analysis:    Component Value Date/Time   COLORURINE YELLOW 12/17/2020 2047   APPEARANCEUR CLEAR 12/17/2020 2047   LABSPEC >1.046 (H) 12/17/2020 2047   PHURINE 6.0 12/17/2020 2047   GLUCOSEU NEGATIVE 12/17/2020 2047   HGBUR NEGATIVE 12/17/2020 2047   BILIRUBINUR NEGATIVE 12/17/2020 2047   KETONESUR 5 (A) 12/17/2020 2047   PROTEINUR NEGATIVE 12/17/2020 2047   NITRITE NEGATIVE 12/17/2020 2047   LEUKOCYTESUR TRACE (A) 12/17/2020 2047    Radiological Exams on Admission: CT HEAD WO CONTRAST  Result Date: 02/15/2021 CLINICAL DATA:  87 year old female status post fall at home. Pain, neurologic deficit with right side weakness. EXAM: CT HEAD WITHOUT CONTRAST TECHNIQUE: Contiguous axial images were obtained from the base of the skull through the vertex without intravenous contrast. COMPARISON:  Head CT without contrast 12/17/2020. FINDINGS: Brain: No midline shift, ventriculomegaly, mass effect, evidence of mass lesion, acute intracranial hemorrhage. Stable cerebral volume. Stable Patchy and confluent  bilateral white matter hypodensity. Deep gray nuclei, brainstem and cerebellum appear relatively spared. There are punctate new calcified foci along the left middle and superior frontal gyrus (coronal image 40) which were not present in February. No acute cortically based infarct is evident in the region. Vascular: Calcified atherosclerosis at the skull base. No suspicious intracranial vascular hyperdensity. Skull: No acute osseous abnormality identified. Sinuses/Orbits: Visualized paranasal sinuses and mastoids are stable and well pneumatized. Other: No orbit or scalp soft tissue injury. IMPRESSION: 1. No acute acute traumatic injury identified. 2. Advanced chronic white matter disease with no acute cortically based infarct identified, but there are two punctate calcified foci about the left superior frontal lobe which are new since February and suspicious for thromboembolic plaque in the setting of new right side weakness. Noncontrast Brain MRI would be valuable if feasible. Electronically Signed   By: Odessa Fleming M.D.   On: 02/15/2021 08:15   DG Chest Port 1 View  Result Date: 02/15/2021 CLINICAL DATA:  87 year old female status post fall at home. Pain, neurologic deficit with right side weakness. EXAM: PORTABLE CHEST 1 VIEW COMPARISON:  CTA chest 12/17/2020 and earlier. FINDINGS: Portable AP upright view at 0732 hours. Lung volumes and mediastinal contours are stable and within normal limits. Calcified aortic atherosclerosis. Visualized tracheal air column is within normal limits. Allowing for portable technique the lungs are clear. No pneumothorax. Stable visualized osseous structures. Right upper extremity calcified atherosclerosis. Negative visible bowel gas. IMPRESSION: No acute cardiopulmonary abnormality or acute traumatic injury identified. Electronically Signed   By: Odessa Fleming M.D.   On: 02/15/2021 08:16   DG Humerus Left  Result Date: 02/15/2021 CLINICAL DATA:  87 year old female status post fall at  home. Pain, neurologic deficit with right side weakness. EXAM: LEFT HUMERUS - 2+ VIEW COMPARISON:  Chest CTA 12/17/2020. FINDINGS: Previous left humerus ORIF with hardware to the distal 3rd humerus shaft, stable from February. Healed humeral neck region fracture. Underlying osteopenia. Alignment appears maintained at the left shoulder and elbow. No acute osseous abnormality identified. Negative visible left chest. IMPRESSION: Previous left humerus ORIF. No acute osseous abnormality identified. Electronically Signed   By: Odessa Fleming M.D.   On: 02/15/2021 08:18   DG Humerus Right  Result Date: 02/15/2021 CLINICAL DATA:  87 year old female status post fall at  home. Pain, neurologic deficit with right side weakness. EXAM: RIGHT HUMERUS - 2+ VIEW COMPARISON:  Right wrist series 12/13/2016. FINDINGS: Calcified peripheral vascular disease. Bone mineralization is within normal limits for age. Alignment appears maintained about the right shoulder and elbow. No right humerus fracture identified. Negative visible right chest. IMPRESSION: 1. No acute fracture or dislocation identified about the right humerus. 2. Calcified peripheral vascular disease. Electronically Signed   By: Odessa Fleming M.D.   On: 02/15/2021 08:17   EKG: Independently reviewed. Sinus rhythm no acute ST/T abnormalities  Assessment/Plan Principal Problem:   Acute CVA (cerebrovascular accident) [facility]) Active Problems:   Hypertension   COPD (chronic obstructive pulmonary disease) (HCC)   Anxiety   History of rheumatoid arthritis   Fall at home   Former smoker  Acute CVA - working diagnosis -admit for full stroke work-up.  Obtain MRI brain without contrast if possible and MRA.  Check 2D echocardiogram with bubble study.  Allow permissive hypertension.  Continue aspirin 325 mg daily for antiplatelet therapy.  Inpatient consultation.  Request PT/OT/SLP evaluation.  Gentle IV fluid hydration.  Allow euglycemia.  Oxygen dependent COPD-not in acute  exacerbation continue supplemental oxygen as needed.  Bronchodilators ordered as needed.  Essential hypertension-allow permissive hypertension in setting of acute CVA.  Fall at home - Pt remains high fall risk -  Fall precautions.  PT/OT evaluation requested.    GAD - stable.   DNR present on admission - continue DNR order while in hospital.    DVT prophylaxis: enoxaparin  Code Status: DNR   Family Communication: Daughter Arline Asp at bedside   Disposition Plan: anticipate home with home health   Consults called: neurology   Admission status: INP   Level of care: Telemetry Standley Dakins MD [facility] How to contact the [facility] Attending or Consulting provider 7A - 7P or covering provider during after hours 7P -7A, for this patient?  1. Check the care team in [facility] and look for a) attending/consulting [facility] provider listed and b) the [facility] team listed 2. Log into [email or web address] and use [place]'s universal password to access. If you do not have the password, please contact the hospital operator. 3. Locate the [facility] provider you are looking for under [facility] and page to a number that you can be directly reached. 4. If you still have difficulty reaching the provider, please page the [facility] (Director on Call) for the Hospitalists listed on amion for assistance.   If 7PM-7AM, please contact night-coverage [email or web address] Password [facility]  02/15/2021, 10:44 AM

## 2021-02-15 NOTE — Progress Notes (Addendum)
02/15/2021 2:25 PM  PT HAVING ACUTE VISION CHANGES AND CHANGE TO NIH SCORE.  OBTAIN STAT CTA HEAD/NECK.   Maryln Manuel MD How to contact the [facility] Attending or Consulting provider 7A - 7P or covering provider during after hours 7P -7A, for this patient?  1. Check the care team in [facility] and look for a) attending/consulting [facility] provider listed and b) the [facility] team listed 2. Log into [email or web address] and use [place]'s universal password to access. If you do not have the password, please contact the hospital operator. 3. Locate the [facility] provider you are looking for under [facility] and page to a number that you can be directly reached. 4. If you still have difficulty reaching the provider, please page the [facility] (Director on Call) for the Hospitalists listed on amion for assistance.

## 2021-02-15 NOTE — Consult Note (Signed)
[facility] TeleNeurology Consult Services  Stat Consult  Date of Service:   02/15/2021 07:19:14  Diagnosis:     .  I63.00 - Cerebrovascular accident (CVA) due to thrombosis of precerebral artery (HCCC)  Impression: 87 year old woman with past medical history of arthritis, hypertension, copd, anxiety who was last normal at 22:00 last evening and woke up and had fall this am with right sided weakness. Suspected left hemispheric stroke - subcortical. HCT pending. Not candidate for thrombolytic treatment. Presentation not c/w proximal lvo.  CT HEAD: Not Reviewed  Our recommendations are outlined below.  Diagnostic Studies: Recommend MRI brain without contrast Routine MRA head without contrast and MRA Neck with contrast Transthoracic Echo with bubble study, if available  Laboratory Studies: Recommend Lipid panel Hemoglobin A1c  Medication: Initiate Aspirin 325 mg daily  Nursing Recommendations: Telemetry, IV Fluids, avoid dextrose containing fluids, Maintain euglycemia Neuro checks q4 hrs x 24 hrs and then per shift Head of bed 30 degrees  Consultations: Recommend Speech therapy if failed dysphagia screen Physical therapy/Occupational therapy  DVT Prophylaxis: Choice of Primary Team  Disposition: Neurology will follow   Imaging: hct NAF  Labs: glc 93  Metrics: [facility] Notification Time: 02/15/2021 07:17:56 Stamp Time: 02/15/2021 07:19:14   ----------------------------------------------------------------------------------------------------  Chief Complaint: fall, right sided weakness  History of Present Illness: Patient is a 87 year old Female.  87 year old woman with past medical history of arthritis, hypertension, copd, anxiety who was last normal at 22:00 last evening and woke up and had fall this am with right sided weakness. She does not take any blood thinners.   Past Medical History:     . Hypertension     . copd,  anxiety, arthritis  Anticoagulant use:  No  Antiplatelet use: No   Examination: BP(157/66), Pulse(90), Blood Glucose(93) 1A: Level of Consciousness - Alert; keenly responsive + 0 1B: Ask Month and Age - Could Not Answer Either Question Correctly + 2 1C: Blink Eyes & Squeeze Hands - Performs Both Tasks + 0 2: Test Horizontal Extraocular Movements - Normal + 0 3: Test Visual Fields - No Visual Loss + 0 4: Test Facial Palsy (Use Grimace if Obtunded) - Normal symmetry + 0 5A: Test Left Arm Motor Drift - No Drift for 10 Seconds + 0 5B: Test Right Arm Motor Drift - Drift, but doesn't hit bed + 1 6A: Test Left Leg Motor Drift - No Drift for 5 Seconds + 0 6B: Test Right Leg Motor Drift - Drift, but doesn't hit bed + 1 7: Test Limb Ataxia (FNF/Heel-Shin) - No Ataxia + 0 8: Test Sensation - Mild-Moderate Loss: Less Sharp/More Dull + 1 9: Test Language/Aphasia - Normal; No aphasia + 0 10: Test Dysarthria - Normal + 0 11: Test Extinction/Inattention - No abnormality + 0  NIHSS Score: 5    Patient / Family was informed the Neurology Consult would occur via TeleHealth consult by way of interactive audio and video telecommunications and consented to receiving care in this manner.  Patient is being evaluated for possible acute neurologic impairment and high probability of imminent or life - threatening deterioration.I spent total of 17 minutes providing care to this patient, including time for face to face visit via telemedicine, review of medical records, imaging studies and discussion of findings with providers, the patient and / or family.   Dr Daisey Must   [facility] [phone]  Case [id number]

## 2021-02-16 ENCOUNTER — Inpatient Hospital Stay (HOSPITAL_COMMUNITY): Payer: Medicare HMO

## 2021-02-16 DIAGNOSIS — I6389 Other cerebral infarction: Secondary | ICD-10-CM

## 2021-02-16 LAB — CBC
HCT: 31.1 % — ABNORMAL LOW (ref 36.0–46.0)
Hemoglobin: 9.7 g/dL — ABNORMAL LOW (ref 12.0–15.0)
MCH: 28.5 pg (ref 26.0–34.0)
MCHC: 31.2 g/dL (ref 30.0–36.0)
MCV: 91.5 fL (ref 80.0–100.0)
Platelets: 199 10*3/uL (ref 150–400)
RBC: 3.4 MIL/uL — ABNORMAL LOW (ref 3.87–5.11)
RDW: 16.3 % — ABNORMAL HIGH (ref 11.5–15.5)
WBC: 5.2 10*3/uL (ref 4.0–10.5)
nRBC: 0 % (ref 0.0–0.2)

## 2021-02-16 LAB — COMPREHENSIVE METABOLIC PANEL
ALT: 12 U/L (ref 0–44)
AST: 16 U/L (ref 15–41)
Albumin: 2.9 g/dL — ABNORMAL LOW (ref 3.5–5.0)
Alkaline Phosphatase: 55 U/L (ref 38–126)
Anion gap: 9 (ref 5–15)
BUN: 15 mg/dL (ref 8–23)
CO2: 20 mmol/L — ABNORMAL LOW (ref 22–32)
Calcium: 8.5 mg/dL — ABNORMAL LOW (ref 8.9–10.3)
Chloride: 107 mmol/L (ref 98–111)
Creatinine, Ser: 0.71 mg/dL (ref 0.44–1.00)
GFR, Estimated: >60 mL/min (ref >60)
Glucose, Bld: 106 mg/dL — ABNORMAL HIGH (ref 70–99)
Potassium: 3.7 mmol/L (ref 3.5–5.1)
Sodium: 136 mmol/L (ref 135–145)
Total Bilirubin: 0.6 mg/dL (ref 0.3–1.2)
Total Protein: 5.6 g/dL — ABNORMAL LOW (ref 6.5–8.1)

## 2021-02-16 LAB — ECHOCARDIOGRAM COMPLETE BUBBLE STUDY
Area-P 1/2: 2.71 cm2
S' Lateral: 1.7 cm

## 2021-02-16 MED ORDER — CLOPIDOGREL BISULFATE 75 MG PO TABS
75.0000 mg | ORAL_TABLET | Freq: Every day | ORAL | Status: DC
  Administered 2021-02-16 – 2021-02-17 (×2): 75 mg via ORAL
  Filled 2021-02-16 (×2): qty 1

## 2021-02-16 MED ORDER — ATORVASTATIN CALCIUM 20 MG PO TABS
20.0000 mg | ORAL_TABLET | Freq: Every evening | ORAL | Status: DC
  Administered 2021-02-16: 20 mg via ORAL
  Filled 2021-02-16: qty 1

## 2021-02-16 MED ORDER — METOPROLOL TARTRATE 25 MG PO TABS
25.0000 mg | ORAL_TABLET | Freq: Two times a day (BID) | ORAL | Status: DC
  Administered 2021-02-16 – 2021-02-17 (×3): 25 mg via ORAL
  Filled 2021-02-16 (×3): qty 1

## 2021-02-16 NOTE — Plan of Care (Signed)
  Problem: Acute Rehab PT Goals(only PT should resolve) Goal: Pt Will Go Supine/Side To Sit Outcome: Progressing Flowsheets (Taken 02/16/2021 1450) Pt will go Supine/Side to Sit: with minimal assist Goal: Patient Will Transfer Sit To/From Stand Outcome: Progressing Flowsheets (Taken 02/16/2021 1450) Patient will transfer sit to/from stand: with minimal assist Goal: Pt Will Transfer Bed To Chair/Chair To Bed Outcome: Progressing Flowsheets (Taken 02/16/2021 1450) Pt will Transfer Bed to Chair/Chair to Bed:  with min assist  with mod assist Goal: Pt Will Ambulate Outcome: Progressing Flowsheets (Taken 02/16/2021 1450) Pt will Ambulate:  25 feet  with minimal assist  with moderate assist  with rolling walker   2:50 PM, 02/16/21 Ocie Bob, MPT Physical Therapist with [facility] [phone] office [phone] mobile phone

## 2021-02-16 NOTE — Progress Notes (Signed)
*  PRELIMINARY RESULTS* Echocardiogram 2D Echocardiogram with bubble study has been performed.  Jeryl Columbia 02/16/2021, 10:13 AM

## 2021-02-16 NOTE — Evaluation (Signed)
Speech Language Pathology Evaluation Patient Details Name: [name] [name] MRN: [id number] DOB: 1931-12-20 Today's Date: 02/16/2021 Time: 1308-6578 SLP Time Calculation (min) (ACUTE ONLY): 27.15 min  Problem List:  Patient Active Problem List   Diagnosis Date Noted  . Acute CVA (cerebrovascular accident) (HCC) 02/15/2021  . Fall at home 02/15/2021  . Former smoker   . Postoperative fever 01/03/2019  . Hypokalemia 01/03/2019  . History of rheumatoid arthritis 01/03/2019  . Closed right hip fracture, initial encounter (HCC) 12/31/2018  . Hypertension 06/27/2018  . COPD (chronic obstructive pulmonary disease) (HCC) 06/27/2018  . Anxiety 06/27/2018  . Headache 06/27/2018   Past Medical History:  Past Medical History:  Diagnosis Date  . Anxiety   . Arthritis    rheumatoid  . COPD (chronic obstructive pulmonary disease) (HCC)   . Fall   . Hypertension    Past Surgical History:  Past Surgical History:  Procedure Laterality Date  . APPENDECTOMY    . CATARACT EXTRACTION W/PHACO Right 11/27/2013   Procedure: CATARACT EXTRACTION PHACO AND INTRAOCULAR LENS PLACEMENT (IOC);  Surgeon: Loraine Leriche T. Nile Riggs, MD;  Location: [facility];  Service: Ophthalmology;  Laterality: Right;  CDE:7.31  . CATARACT EXTRACTION W/PHACO Left 12/11/2013   Procedure: CATARACT EXTRACTION PHACO AND INTRAOCULAR LENS PLACEMENT (IOC);  Surgeon: Loraine Leriche T. Nile Riggs, MD;  Location: [facility];  Service: Ophthalmology;  Laterality: Left;  CDE:7.94  . HIP ARTHROPLASTY Right 01/02/2019   Procedure: ARTHROPLASTY BIPOLAR HIP (HEMIARTHROPLASTY);  Surgeon: Durene Romans, MD;  Location: [facility];  Service: Orthopedics;  Laterality: Right;  . HUMERUS FRACTURE SURGERY Left    had surgery x3 on it.  . REPLACEMENT TOTAL KNEE BILATERAL     HPI:  MARGRETTE [name] is a 87 y.o. female with medical history significant for rheumatoid arthritis, oxygen dependent COPD from former smoking, hypertension, falls, anxiety disorder who reportedly had a fall at  home with this morning and was complaining of right arm and shoulder pain.  She was noted to have multiple abrasions on the arms and knees.  She normally walks with the use of a walker.  She uses nighttime oxygen 2 L/min nasal cannula.     She reported that she fell after waking up early this morning trying to get out of bed.  She reported that for some reason her right side was not working properly.  She felt like her right arms and legs were dead.  She had decreased strength mobility and sensation in her right side.  She fell immediately upon getting up.  She injured her arms and knees. No head injury.  She is not on anticoagulation.  Family reports that she was last normal at 10 PM yesterday evening.  When she woke up at around 5 AM she could not move her right side. MRI shows Scattered small acute infarcts in the left cerebral hemisphere suggestive of unilateral emboli. SLE requested.   Assessment / Plan / Recommendation Clinical Impression  Speech Language Evaluation completed while Pt was sitting upright in bed. 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 Pt had returned to cooking and her cognitive status was normal. Her cognitive presentation today presents as moderately severe impairment (note only a brief screen was completed); reportedly this presentation is very similar to her post-covid state. Pt was evaluated today by means of portions of the [facility] SLUMS Examination. She was not oriented to day, situation, year or location (  not to hospital or even state). Other areas of need noted are short term memory, executive functioning, awareness, problem solving and executive functioning. Naming and reading is intact and Pt was able to follow basic commands. Pt's daughter reports the plan is to d/c home; Recommend HH ST for a more in depth cognitive assessment and treatment as indicated  to facilitate safety and independence in the home environment. Reviewed POC with daughter who is in agreement with plan.    SLP Assessment  SLP Recommendation/Assessment: All further Speech Lanaguage Pathology  needs can be addressed in the next venue of care SLP Visit Diagnosis: Cognitive communication deficit (R41.841)             SLP Evaluation Cognition  Overall Cognitive Status: Impaired/Different from baseline Arousal/Alertness: Awake/alert Orientation Level: Oriented to person;Disoriented to situation;Disoriented to time;Disoriented to place Attention: Focused Focused Attention: Impaired Memory: Impaired Awareness: Impaired Problem Solving: Impaired       Comprehension       Expression Verbal Expression Overall Verbal Expression: Appears within functional limits for tasks assessed Repetition: No impairment Naming: No impairment Written Expression Dominant Hand: Right   Oral / Motor  Oral Motor/Sensory Function Overall Oral Motor/Sensory Function: Within functional limits Motor Speech Overall Motor Speech: Appears within functional limits for tasks assessed     Kimblery Diop H. Romie Levee, CCC-SLP Speech Language Pathologist          Georgetta Haber 02/16/2021, 6:01 PM

## 2021-02-16 NOTE — Progress Notes (Signed)
PROGRESS NOTE   [name] [name]  EAV:[id number] DOB: Apr 27, 1932 DOA: 02/15/2021 PCP: Richmond Campbell., PA-C   Chief Complaint  Patient presents with  . Fall   Level of care: Telemetry  Brief Admission History:  86 y.o. female with medical history significant for rheumatoid arthritis, oxygen dependent COPD from former smoking, hypertension, falls, anxiety disorder who reportedly had a fall at home with this morning and was complaining of right arm and shoulder pain.  She was noted to have multiple abrasions on the arms and knees.  She normally walks with the use of a walker.  She uses nighttime oxygen 2 L/min nasal cannula.  She reported that she fell after waking up early this morning trying to get out of bed.  She reported that for some reason her right side was not working properly.  She felt like her right arms and legs were dead.  She had decreased strength mobility and sensation in her right side.  She fell immediately upon getting up.  She injured her arms and knees. No head injury.  She is not on anticoagulation.  Family reports that she was last normal at 10 PM yesterday evening.  When she woke up at around 5 AM she could not move her right side.  Assessment & Plan:   Principal Problem:   Acute CVA (cerebrovascular accident) [facility]) Active Problems:   Hypertension   COPD (chronic obstructive pulmonary disease) (HCC)   Anxiety   History of rheumatoid arthritis   Fall at home   Former smoker  Acute CVA - working diagnosis -admit for full stroke work-up.  MRI brain without contrast and MRA positive for acute CVA, possibly embolic. Pt remains on aspirin and plavix for now pending neurology consultation.  Follow up 2D echocardiogram with bubble study.  Allowing permissive hypertension.    Inpatient neurology consultation.  Request PT/OT/SLP evaluation.  Gentle IV fluid hydration.  Allow euglycemia.  Oxygen dependent COPD-not in acute exacerbation continue supplemental oxygen as  needed.  Bronchodilators ordered as needed.  Essential hypertension-allow permissive hypertension in setting of acute CVA. Slowly resuming home BP meds, metoprolol has been restarted.   PVD/Carotid artery disease - follow up lipid panel and initiate statin therapy for LDL>70.   Fall at home - Pt remains high fall risk -  Fall precautions.  PT/OT evaluation requested.    GAD - stable.   DNR present on admission - continue DNR order while in hospital.    DVT prophylaxis: enoxaparin  Code Status: DNR   Family Communication: Daughter Arline Asp at bedside   Disposition Plan: anticipate home with home health   Consults called: neurology    Remains inpatient appropriate because:IV treatments appropriate due to intensity of illness or inability to take PO and Inpatient level of care appropriate due to severity of illness   Dispo: The patient is from: Home              Anticipated d/c is to: Home              Patient currently is not medically stable to d/c.   Difficult to place patient No   Consultants:   Neurology Dr. Gerilyn Pilgrim  Procedures:     Antimicrobials:     Subjective: Pt reports that she is getting stronger in her right arm and leg today.  She is sitting up in a chair today.   Objective: Vitals:   02/16/21 0601 02/16/21 0813 02/16/21 1000 02/16/21 1421  BP: (!) 167/85  128/66 Marland Kitchen)  151/86  Pulse: (!) 105  73 78  Resp: 18     Temp: 99.4 F (37.4 C)  98.3 F (36.8 C) 98.2 F (36.8 C)  TempSrc: Oral  Oral Oral  SpO2: 94% 96% 98% 92%  Weight:      Height:        Intake/Output Summary (Last 24 hours) at 02/16/2021 1751 Last data filed at 02/16/2021 1500 Gross per 24 hour  Intake 1417.81 ml  Output --  Net 1417.81 ml   Filed Weights   02/15/21 0625 02/15/21 1500  Weight: 49.9 kg 42.4 kg    Examination:  General exam: frail, elderly female, pleasant, cooperative, Appears calm and comfortable  Respiratory system: Clear to auscultation. Respiratory effort  normal. Cardiovascular system: normal S1 & S2 heard. No JVD, murmurs, rubs, gallops or clicks. No pedal edema. Gastrointestinal system: Abdomen is nondistended, soft and nontender. No organomegaly or masses felt. Normal bowel sounds heard. Central nervous system: Alert and oriented. No focal neurological deficits. Extremities: 4/5 RUE/RLE, 5/5 LUE/LLE. Skin: No rashes, lesions or ulcers Psychiatry: Judgement and insight appear normal. Mood & affect appropriate.   Data Reviewed: I have personally reviewed following labs and imaging studies  CBC: Recent Labs  Lab 02/15/21 0721 02/15/21 0725 02/16/21 0633  WBC 6.2  --  5.2  NEUTROABS 3.8  --   --   HGB 11.3* 12.2 9.7*  HCT 36.4 36.0 31.1*  MCV 91.9  --  91.5  PLT 230  --  199    Basic Metabolic Panel: Recent Labs  Lab 02/15/21 0721 02/15/21 0725 02/16/21 0633  NA 136 138 136  K 3.9 4.0 3.7  CL 104 106 107  CO2 22  --  20*  GLUCOSE 109* 106* 106*  BUN 14 13 15   CREATININE 0.78 0.80 0.71  CALCIUM 9.2  --  8.5*    GFR: Estimated Creatinine Clearance: 33.8 mL/min (by C-G formula based on SCr of 0.71 mg/dL).  Liver Function Tests: Recent Labs  Lab 02/15/21 0721 02/16/21 0633  AST 18 16  ALT 13 12  ALKPHOS 68 55  BILITOT 0.8 0.6  PROT 6.9 5.6*  ALBUMIN 3.7 2.9*    CBG: Recent Labs  Lab 02/15/21 0725  GLUCAP 93    Recent Results (from the past 240 hour(s))  Resp Panel by RT-PCR (Flu A&B, Covid) Nasopharyngeal Swab     Status: None   Collection Time: 02/15/21  8:04 AM   Specimen: Nasopharyngeal Swab; Nasopharyngeal(NP) swabs in vial transport medium  Result Value Ref Range Status   SARS Coronavirus 2 by RT PCR NEGATIVE NEGATIVE Final    Comment: (NOTE) SARS-CoV-2 target nucleic acids are NOT DETECTED.  The SARS-CoV-2 RNA is generally detectable in upper respiratory specimens during the acute phase of infection. The lowest concentration of SARS-CoV-2 viral copies this assay can detect is 138 copies/mL. A  negative result does not preclude SARS-Cov-2 infection and should not be used as the sole basis for treatment or other patient management decisions. A negative result may occur with  improper specimen collection/handling, submission of specimen other than nasopharyngeal swab, presence of viral mutation(s) within the areas targeted by this assay, and inadequate number of viral copies(<138 copies/mL). A negative result must be combined with clinical observations, patient history, and epidemiological information. The expected result is Negative.  Fact Sheet for Patients:  BloggerCourse.com  Fact Sheet for Healthcare Providers:  SeriousBroker.it  This test is no t yet approved or cleared by the [place] and  has been authorized for detection and/or diagnosis of SARS-CoV-2 by FDA under an Emergency Use Authorization (EUA). This EUA will remain  in effect (meaning this test can be used) for the duration of the COVID-19 declaration under Section 564(b)(1) of the Act, 21 U.S.C.section 360bbb-3(b)(1), unless the authorization is terminated  or revoked sooner.       Influenza A by PCR NEGATIVE NEGATIVE Final   Influenza B by PCR NEGATIVE NEGATIVE Final    Comment: (NOTE) The Xpert Xpress SARS-CoV-2/FLU/RSV plus assay is intended as an aid in the diagnosis of influenza from Nasopharyngeal swab specimens and should not be used as a sole basis for treatment. Nasal washings and aspirates are unacceptable for Xpert Xpress SARS-CoV-2/FLU/RSV testing.  Fact Sheet for Patients: BloggerCourse.com  Fact Sheet for Healthcare Providers: SeriousBroker.it  This test is not yet approved or cleared by the [place] FDA and has been authorized for detection and/or diagnosis of SARS-CoV-2 by FDA under an Emergency Use Authorization (EUA). This EUA will remain in effect (meaning this test can  be used) for the duration of the COVID-19 declaration under Section 564(b)(1) of the Act, 21 U.S.C. section 360bbb-3(b)(1), unless the authorization is terminated or revoked.  Performed at [facility], [address], [place], [place] [address]      Radiology Studies: CT ANGIO HEAD W OR WO CONTRAST  Result Date: 02/15/2021 CLINICAL DATA:  Neuro deficit, acute, stroke. Acute visual changes. Changes to NIH score. EXAM: CT ANGIOGRAPHY HEAD AND NECK TECHNIQUE: Multidetector CT imaging of the head and neck was performed using the standard protocol during bolus administration of intravenous contrast. Multiplanar CT image reconstructions and MIPs were obtained to evaluate the vascular anatomy. Carotid stenosis measurements (when applicable) are obtained utilizing NASCET criteria, using the distal internal carotid diameter as the denominator. CONTRAST:  75mL OMNIPAQUE IOHEXOL 350 MG/ML SOLN COMPARISON:  CT head without contrast 02/15/2021. Bilateral carotid Doppler study 02/15/2021 FINDINGS: CTA NECK FINDINGS Aortic arch: Atherosclerotic calcifications are present within the aortic arch at the origins the great vessels no significant stenosis is present at the arch. No aneurysm is present. Right carotid system: Dense calcifications are present in the proximal right common carotid artery without significant stenosis at the bifurcation. Additional mural calcifications are present without stenosis. Dense atherosclerotic calcifications are present at carotid bifurcation. Lumen of the proximal right ICA is narrowed to 1.6 mm. This compares with a more distal measurement 4.1 mm. The cervical right ICA is otherwise within normal limits. Left carotid system: The left common carotid artery demonstrates extensive mural calcifications without significant luminal stenosis. Dense circumferential calcification is present just beyond the bifurcation. Minimal luminal diameter is 2.5 mm. This compares with more distal normal  segment of 5.3 mm. Vertebral arteries: The left vertebral artery is the dominant vessel. Right vertebral artery is hypoplastic. High-grade stenosis is present at the origin of the dominant left vertebral artery. No additional stenoses are present in either vertebral artery in the neck. Moderate tortuosity is present in the distal V2 segment bilaterally without stenosis. Skeleton: Multilevel degenerative changes are present. Degenerative anterolisthesis present at C4-5 and C5-6. Slight retrolisthesis present at C3-4. No focal lytic or blastic lesions are present. Exaggerated cervical kyphosis noted. Patient is edentulous. Other neck: Soft tissues of the neck otherwise unremarkable. Upper chest: Centrilobular emphysematous changes present at both lung apices. No focal nodule or mass lesion is present. No focal airspace disease is evident. Review of the MIP images confirms the above findings CTA HEAD FINDINGS Anterior circulation: Atherosclerotic  calcifications are present within the cavernous internal carotid arteries bilaterally without focal stenosis through the ICA termini. The A1 and M1 segments are normal. The anterior communicating artery is patent. MCA bifurcations are normal. ACA and MCA branch vessels are within normal limits. Posterior circulation: Hypoplastic right vertebral artery terminates at the PICA. Left vertebral artery becomes the basilar artery. PICA origin is visualized normal. Basilar artery is within normal limits. Both posterior cerebral arteries originate from basilar tip. Left posterior communicating artery contributes. The PCA branch vessels are within normal limits bilaterally. Venous sinuses: Dural sinuses are patent. Right transverse sinus is dominant. Straight sinus deep cerebral veins are intact. Cortical veins are unremarkable. Anatomic variants: None Review of the MIP images confirms the above findings IMPRESSION: 1. No emergent large vessel occlusion. 2. 60% stenosis of the proximal  right internal carotid artery. Lumen is narrowed to 1.6 mm. 3. 60% stenosis of the proximal left internal carotid artery. Lumen is narrowed to 2.5 mm. 4. High-grade stenosis at the origin of the dominant left vertebral artery. 5. Atherosclerotic changes within the cavernous internal carotid arteries bilaterally without significant stenosis. 6. Multilevel spondylosis of the cervical spine. 7. Atherosclerotic changes at the origins the great vessels in throughout the common carotid arteries bilaterally left greater than right without focal stenosis. 8. Aortic Atherosclerosis (ICD10-I70.0) and Emphysema (ICD10-J43.9). Electronically Signed   By: Marin Roberts M.D.   On: 02/15/2021 15:37   CT HEAD WO CONTRAST  Result Date: 02/15/2021 CLINICAL DATA:  87 year old female status post fall at home. Pain, neurologic deficit with right side weakness. EXAM: CT HEAD WITHOUT CONTRAST TECHNIQUE: Contiguous axial images were obtained from the base of the skull through the vertex without intravenous contrast. COMPARISON:  Head CT without contrast 12/17/2020. FINDINGS: Brain: No midline shift, ventriculomegaly, mass effect, evidence of mass lesion, acute intracranial hemorrhage. Stable cerebral volume. Stable Patchy and confluent bilateral white matter hypodensity. Deep gray nuclei, brainstem and cerebellum appear relatively spared. There are punctate new calcified foci along the left middle and superior frontal gyrus (coronal image 40) which were not present in February. No acute cortically based infarct is evident in the region. Vascular: Calcified atherosclerosis at the skull base. No suspicious intracranial vascular hyperdensity. Skull: No acute osseous abnormality identified. Sinuses/Orbits: Visualized paranasal sinuses and mastoids are stable and well pneumatized. Other: No orbit or scalp soft tissue injury. IMPRESSION: 1. No acute acute traumatic injury identified. 2. Advanced chronic white matter disease with no  acute cortically based infarct identified, but there are two punctate calcified foci about the left superior frontal lobe which are new since February and suspicious for thromboembolic plaque in the setting of new right side weakness. Noncontrast Brain MRI would be valuable if feasible. Electronically Signed   By: Odessa Fleming M.D.   On: 02/15/2021 08:15   CT ANGIO NECK W OR WO CONTRAST  Result Date: 02/15/2021 CLINICAL DATA:  Neuro deficit, acute, stroke. Acute visual changes. Changes to NIH score. EXAM: CT ANGIOGRAPHY HEAD AND NECK TECHNIQUE: Multidetector CT imaging of the head and neck was performed using the standard protocol during bolus administration of intravenous contrast. Multiplanar CT image reconstructions and MIPs were obtained to evaluate the vascular anatomy. Carotid stenosis measurements (when applicable) are obtained utilizing NASCET criteria, using the distal internal carotid diameter as the denominator. CONTRAST:  75mL OMNIPAQUE IOHEXOL 350 MG/ML SOLN COMPARISON:  CT head without contrast 02/15/2021. Bilateral carotid Doppler study 02/15/2021 FINDINGS: CTA NECK FINDINGS Aortic arch: Atherosclerotic calcifications are present within the aortic arch  at the origins the great vessels no significant stenosis is present at the arch. No aneurysm is present. Right carotid system: Dense calcifications are present in the proximal right common carotid artery without significant stenosis at the bifurcation. Additional mural calcifications are present without stenosis. Dense atherosclerotic calcifications are present at carotid bifurcation. Lumen of the proximal right ICA is narrowed to 1.6 mm. This compares with a more distal measurement 4.1 mm. The cervical right ICA is otherwise within normal limits. Left carotid system: The left common carotid artery demonstrates extensive mural calcifications without significant luminal stenosis. Dense circumferential calcification is present just beyond the bifurcation.  Minimal luminal diameter is 2.5 mm. This compares with more distal normal segment of 5.3 mm. Vertebral arteries: The left vertebral artery is the dominant vessel. Right vertebral artery is hypoplastic. High-grade stenosis is present at the origin of the dominant left vertebral artery. No additional stenoses are present in either vertebral artery in the neck. Moderate tortuosity is present in the distal V2 segment bilaterally without stenosis. Skeleton: Multilevel degenerative changes are present. Degenerative anterolisthesis present at C4-5 and C5-6. Slight retrolisthesis present at C3-4. No focal lytic or blastic lesions are present. Exaggerated cervical kyphosis noted. Patient is edentulous. Other neck: Soft tissues of the neck otherwise unremarkable. Upper chest: Centrilobular emphysematous changes present at both lung apices. No focal nodule or mass lesion is present. No focal airspace disease is evident. Review of the MIP images confirms the above findings CTA HEAD FINDINGS Anterior circulation: Atherosclerotic calcifications are present within the cavernous internal carotid arteries bilaterally without focal stenosis through the ICA termini. The A1 and M1 segments are normal. The anterior communicating artery is patent. MCA bifurcations are normal. ACA and MCA branch vessels are within normal limits. Posterior circulation: Hypoplastic right vertebral artery terminates at the PICA. Left vertebral artery becomes the basilar artery. PICA origin is visualized normal. Basilar artery is within normal limits. Both posterior cerebral arteries originate from basilar tip. Left posterior communicating artery contributes. The PCA branch vessels are within normal limits bilaterally. Venous sinuses: Dural sinuses are patent. Right transverse sinus is dominant. Straight sinus deep cerebral veins are intact. Cortical veins are unremarkable. Anatomic variants: None Review of the MIP images confirms the above findings  IMPRESSION: 1. No emergent large vessel occlusion. 2. 60% stenosis of the proximal right internal carotid artery. Lumen is narrowed to 1.6 mm. 3. 60% stenosis of the proximal left internal carotid artery. Lumen is narrowed to 2.5 mm. 4. High-grade stenosis at the origin of the dominant left vertebral artery. 5. Atherosclerotic changes within the cavernous internal carotid arteries bilaterally without significant stenosis. 6. Multilevel spondylosis of the cervical spine. 7. Atherosclerotic changes at the origins the great vessels in throughout the common carotid arteries bilaterally left greater than right without focal stenosis. 8. Aortic Atherosclerosis (ICD10-I70.0) and Emphysema (ICD10-J43.9). Electronically Signed   By: Marin Roberts M.D.   On: 02/15/2021 15:37   MR ANGIO HEAD WO CONTRAST  Result Date: 02/16/2021 CLINICAL DATA:  Acute vision changes.  Changes to NIH score. EXAM: MRI HEAD WITHOUT CONTRAST MRA HEAD WITHOUT CONTRAST TECHNIQUE: Multiplanar, multiecho pulse sequences of the brain and surrounding structures were obtained without intravenous contrast. Angiographic images of the head were obtained using MRA technique without contrast. COMPARISON:  Head CT and CTA 02/15/2021 FINDINGS: MRI HEAD FINDINGS Brain: There are scattered small acute cortical and subcortical infarcts in the left frontal, left parietal, and lateral left occipital lobes (MCA and watershed territories). Confluent T2 hyperintensities in the  cerebral white matter bilaterally and in the pons are nonspecific but compatible with severe chronic small vessel ischemic disease. There is moderate cerebral atrophy. No intracranial hemorrhage, mass, midline shift, or extra-axial fluid collection is identified. Vascular: Major intracranial vascular flow voids are preserved. Skull and upper cervical spine: Unremarkable bone marrow signal. Sinuses/Orbits: Bilateral cataract extraction. Paranasal sinuses and mastoid air cells are clear.  Other: None. MRA HEAD FINDINGS The visualized distal left vertebral artery is widely patent and strongly dominant, supplying the basilar. The right vertebral artery is small and ends in PICA. Patent PICA and SCA origins are seen bilaterally. The basilar artery is widely patent. There are left larger than right posterior communicating arteries. Both PCAs are patent with distal branch vessel attenuation and irregularity but no evidence of a significant proximal stenosis. The internal carotid arteries are patent from skull base to carotid termini without evidence of a significant stenosis. ACAs and MCAs are patent without evidence of a significant A1 or M1 stenosis. Segmental regions of signal loss involving MCA branch vessels bilaterally are favored to be artifactual given normal appearance on yesterday's CTA. No aneurysm is identified. IMPRESSION: 1. Scattered small acute infarcts in the left cerebral hemisphere suggestive of unilateral emboli. 2. Severe chronic small vessel ischemic disease. 3. No major intracranial arterial occlusion or significant proximal stenosis. Electronically Signed   By: Sebastian Ache M.D.   On: 02/16/2021 14:10   MR BRAIN WO CONTRAST  Result Date: 02/16/2021 CLINICAL DATA:  Acute vision changes.  Changes to NIH score. EXAM: MRI HEAD WITHOUT CONTRAST MRA HEAD WITHOUT CONTRAST TECHNIQUE: Multiplanar, multiecho pulse sequences of the brain and surrounding structures were obtained without intravenous contrast. Angiographic images of the head were obtained using MRA technique without contrast. COMPARISON:  Head CT and CTA 02/15/2021 FINDINGS: MRI HEAD FINDINGS Brain: There are scattered small acute cortical and subcortical infarcts in the left frontal, left parietal, and lateral left occipital lobes (MCA and watershed territories). Confluent T2 hyperintensities in the cerebral white matter bilaterally and in the pons are nonspecific but compatible with severe chronic small vessel ischemic  disease. There is moderate cerebral atrophy. No intracranial hemorrhage, mass, midline shift, or extra-axial fluid collection is identified. Vascular: Major intracranial vascular flow voids are preserved. Skull and upper cervical spine: Unremarkable bone marrow signal. Sinuses/Orbits: Bilateral cataract extraction. Paranasal sinuses and mastoid air cells are clear. Other: None. MRA HEAD FINDINGS The visualized distal left vertebral artery is widely patent and strongly dominant, supplying the basilar. The right vertebral artery is small and ends in PICA. Patent PICA and SCA origins are seen bilaterally. The basilar artery is widely patent. There are left larger than right posterior communicating arteries. Both PCAs are patent with distal branch vessel attenuation and irregularity but no evidence of a significant proximal stenosis. The internal carotid arteries are patent from skull base to carotid termini without evidence of a significant stenosis. ACAs and MCAs are patent without evidence of a significant A1 or M1 stenosis. Segmental regions of signal loss involving MCA branch vessels bilaterally are favored to be artifactual given normal appearance on yesterday's CTA. No aneurysm is identified. IMPRESSION: 1. Scattered small acute infarcts in the left cerebral hemisphere suggestive of unilateral emboli. 2. Severe chronic small vessel ischemic disease. 3. No major intracranial arterial occlusion or significant proximal stenosis. Electronically Signed   By: Sebastian Ache M.D.   On: 02/16/2021 14:10   US Carotid Bilateral (at [facility] and [facility] only)  Result Date: 02/15/2021 CLINICAL DATA:  TIA.  History of hypertension.  Former smoker. EXAM: BILATERAL CAROTID DUPLEX ULTRASOUND TECHNIQUE: Wallace Cullens scale imaging, color Doppler and duplex ultrasound were performed of bilateral carotid and vertebral arteries in the neck. COMPARISON:  None. FINDINGS: Criteria: Quantification of carotid stenosis is based on velocity parameters that  correlate the residual internal carotid diameter with NASCET-based stenosis levels, using the diameter of the distal internal carotid lumen as the denominator for stenosis measurement. The following velocity measurements were obtained: RIGHT ICA: 247/36 cm/sec CCA: 64/14 cm/sec SYSTOLIC ICA/CCA RATIO:  3.9 ECA: 60 cm/sec LEFT ICA: 188/46 cm/sec CCA: /125/24 cm/sec SYSTOLIC ICA/CCA RATIO:  1.2 ECA: 159 cm/sec RIGHT CAROTID ARTERY: There is a moderate to large amount of eccentric echogenic plaque scattered throughout the right common carotid artery (representative images 3, 7 and 11). There is a large amount of eccentric echogenic plaque within the right carotid bulb (images 14 and 16, extending to involve the origin and proximal aspects of the right internal carotid artery (image 23, which results in elevated peak systolic velocities within the proximal and mid aspects of the right internal carotid artery. Greatest acquired peak systolic velocity with proximal right ICA measures 247 centimeters/second (image 25). RIGHT VERTEBRAL ARTERY:  Antegrade Flow LEFT CAROTID ARTERY: There is a large amount of eccentric echogenic plaque throughout the left common carotid artery (images 35, 36, 40 and 44). There is a large amount of eccentric echogenic plaque within the left carotid bulb (image 49). There is a large amount of eccentric echogenic densely shadowing plaque involving the origin and proximal aspects of the left internal carotid artery (image 58), which results in elevated peak systolic velocities within the proximal and mid aspects of the left internal carotid artery. Greatest acquired peak systolic velocity within mid aspect the left ICA measures 188 centimeters/second (image 61). LEFT VERTEBRAL ARTERY:  Antegrade flow IMPRESSION: Large amount of bilateral atherosclerotic plaque results in elevated peak systolic velocities within the bilateral internal carotid arteries compatible with the greater than 70% luminal  narrowing within the right internal carotid artery and the higher end of the 50-69% luminal narrowing within the left internal carotid artery. Further evaluation with CTA could performed as clinically indicated. Electronically Signed   By: Simonne Come M.D.   On: 02/15/2021 12:46   DG Chest Port 1 View  Result Date: 02/15/2021 CLINICAL DATA:  87 year old female status post fall at home. Pain, neurologic deficit with right side weakness. EXAM: PORTABLE CHEST 1 VIEW COMPARISON:  CTA chest 12/17/2020 and earlier. FINDINGS: Portable AP upright view at 0732 hours. Lung volumes and mediastinal contours are stable and within normal limits. Calcified aortic atherosclerosis. Visualized tracheal air column is within normal limits. Allowing for portable technique the lungs are clear. No pneumothorax. Stable visualized osseous structures. Right upper extremity calcified atherosclerosis. Negative visible bowel gas. IMPRESSION: No acute cardiopulmonary abnormality or acute traumatic injury identified. Electronically Signed   By: Odessa Fleming M.D.   On: 02/15/2021 08:16   DG Humerus Left  Result Date: 02/15/2021 CLINICAL DATA:  87 year old female status post fall at home. Pain, neurologic deficit with right side weakness. EXAM: LEFT HUMERUS - 2+ VIEW COMPARISON:  Chest CTA 12/17/2020. FINDINGS: Previous left humerus ORIF with hardware to the distal 3rd humerus shaft, stable from February. Healed humeral neck region fracture. Underlying osteopenia. Alignment appears maintained at the left shoulder and elbow. No acute osseous abnormality identified. Negative visible left chest. IMPRESSION: Previous left humerus ORIF. No acute osseous abnormality identified. Electronically Signed   By: Althea Grimmer.D.  On: 02/15/2021 08:18   DG Humerus Right  Result Date: 02/15/2021 CLINICAL DATA:  87 year old female status post fall at home. Pain, neurologic deficit with right side weakness. EXAM: RIGHT HUMERUS - 2+ VIEW COMPARISON:  Right wrist  series 12/13/2016. FINDINGS: Calcified peripheral vascular disease. Bone mineralization is within normal limits for age. Alignment appears maintained about the right shoulder and elbow. No right humerus fracture identified. Negative visible right chest. IMPRESSION: 1. No acute fracture or dislocation identified about the right humerus. 2. Calcified peripheral vascular disease. Electronically Signed   By: Odessa Fleming M.D.   On: 02/15/2021 08:17   ECHOCARDIOGRAM COMPLETE BUBBLE STUDY  Result Date: 02/16/2021    ECHOCARDIOGRAM REPORT   Patient Name:   [name] [name] Date of Exam: 02/16/2021 Medical Rec #:  [id number]       Height:       61.0 in Accession #:    [id number]      Weight:       93.5 lb Date of Birth:  1931/12/27       BSA:          1.367 m Patient Age:    88 years        BP:           167/85 mmHg Patient Gender: F               HR:           105 bpm. [facility] Procedure: 2D Echo and Saline Contrast Bubble Study Indications:    TIA (transient ischemic attack) 435.9 / G45.9  History:        Patient has prior history of Echocardiogram examinations, most                 recent 06/27/2018. Stroke and COPD; Risk Factors:Former Smoker                 and Hypertension. History of rheumatoid arthritis.  Sonographer:    Jeryl Columbia RDCS (AE) Referring Phys: [id number] Jezabel Lecker L Meshia Rau IMPRESSIONS  1. Left ventricular ejection fraction, by estimation, is 60 to 65%. The left ventricle has normal function. The left ventricle has no regional wall motion abnormalities. There is mild left ventricular hypertrophy. Left ventricular diastolic parameters are consistent with Grade I diastolic dysfunction (impaired relaxation).  2. Right ventricular systolic function is normal. The right ventricular size is normal.  3. Technically difficult visualization during bubble study, no clear evidence of shunt.  4. The mitral valve is normal in structure. No evidence of mitral valve regurgitation. No evidence of mitral  stenosis.  5. The aortic valve is tricuspid. Aortic valve regurgitation is not visualized. No aortic stenosis is present.  6. The inferior vena cava is normal in size with greater than 50% respiratory variability, suggesting right atrial pressure of 3 mmHg. FINDINGS  Left Ventricle: Left ventricular ejection fraction, by estimation, is 60 to 65%. The left ventricle has normal function. The left ventricle has no regional wall motion abnormalities. The left ventricular internal cavity size was normal in size. There is  mild left ventricular hypertrophy. Left ventricular diastolic parameters are consistent with Grade I diastolic dysfunction (impaired relaxation). Normal left ventricular filling pressure. Right Ventricle: The right ventricular size is normal. No increase in right ventricular wall thickness. Right ventricular systolic function is normal. Left Atrium: Left atrial size was normal in size. Right Atrium: Right atrial size was normal in size. Pericardium: There is no evidence of pericardial  effusion. Mitral Valve: The mitral valve is normal in structure. No evidence of mitral valve regurgitation. No evidence of mitral valve stenosis. Tricuspid Valve: The tricuspid valve is normal in structure. Tricuspid valve regurgitation is not demonstrated. No evidence of tricuspid stenosis. Aortic Valve: The aortic valve is tricuspid. Aortic valve regurgitation is not visualized. No aortic stenosis is present. Pulmonic Valve: The pulmonic valve was not well visualized. Pulmonic valve regurgitation is not visualized. No evidence of pulmonic stenosis. Aorta: The aortic root is normal in size and structure. Pulmonary Artery: Indeterminant PASP, inadequate TR jet. Venous: The inferior vena cava is normal in size with greater than 50% respiratory variability, suggesting right atrial pressure of 3 mmHg. IAS/Shunts: The interatrial septum was not well visualized. Agitated saline contrast was given intravenously to evaluate for  intracardiac shunting.  LEFT VENTRICLE PLAX 2D LVIDd:         2.46 cm  Diastology LVIDs:         1.70 cm  LV e' medial:    6.31 cm/s LV PW:         1.10 cm  LV E/e' medial:  13.0 LV IVS:        1.03 cm  LV e' lateral:   9.36 cm/s LVOT diam:     1.70 cm  LV E/e' lateral: 8.8 LV SV:         52 LV SV Index:   38 LVOT Area:     2.27 cm  RIGHT VENTRICLE RV S prime:     14.80 cm/s TAPSE (M-mode): 1.8 cm LEFT ATRIUM             Index       RIGHT ATRIUM          Index LA diam:        3.00 cm 2.19 cm/m  RA Area:     6.04 cm LA Vol (A2C):   20.8 ml 15.22 ml/m RA Volume:   9.84 ml  7.20 ml/m LA Vol (A4C):   16.5 ml 12.07 ml/m LA Biplane Vol: 19.3 ml 14.12 ml/m  AORTIC VALVE LVOT Vmax:   119.24 cm/s LVOT Vmean:  73.835 cm/s LVOT VTI:    0.229 m  AORTA Ao Root diam: 2.60 cm MITRAL VALVE MV Area (PHT): 2.71 cm    SHUNTS MV Decel Time: 280 msec    Systemic VTI:  0.23 m MV E velocity: 82.00 cm/s  Systemic Diam: 1.70 cm MV A velocity: 92.40 cm/s MV E/A ratio:  0.89 Dina Rich MD Electronically signed by Dina Rich MD Signature Date/Time: 02/16/2021/10:47:59 AM    Final     Scheduled Meds: . ALPRAZolam  0.5 mg Oral QHS  . aspirin  300 mg Rectal Daily   Or  . aspirin  325 mg Oral Daily  . clopidogrel  75 mg Oral Daily  . enoxaparin (LOVENOX) injection  30 mg Subcutaneous Q24H  . feeding supplement  237 mL Oral BID BM  . fluticasone  2 spray Each Nare QHS  . leflunomide  20 mg Oral Daily  . metoprolol tartrate  25 mg Oral BID  . mometasone-formoterol  2 puff Inhalation BID  . pantoprazole  40 mg Oral Q0600   Continuous Infusions:   LOS: 1 day   Time spent: 37 mins   Kevon Tench Laural Benes, MD How to contact the [facility] Attending or Consulting provider 7A - 7P or covering provider during after hours 7P -7A, for this patient?  1. Check the care team in [facility] and  look for a) attending/consulting [facility] provider listed and b) the [facility] team listed 2. Log into [email or web address] and use 's universal password  to access. If you do not have the password, please contact the hospital operator. 3. Locate the [facility] provider you are looking for under [facility] and page to a number that you can be directly reached. 4. If you still have difficulty reaching the provider, please page the [facility] (Director on Call) for the Hospitalists listed on amion for assistance.  02/16/2021, 5:51 PM   '

## 2021-02-16 NOTE — Plan of Care (Signed)
  Problem: Acute Rehab OT Goals (only OT should resolve) Goal: Pt. Will Perform Grooming Flowsheets (Taken 02/16/2021 1208) Pt Will Perform Grooming:  with supervision  standing Goal: Pt. Will Perform Upper Body Dressing Flowsheets (Taken 02/16/2021 1208) Pt Will Perform Upper Body Dressing:  with modified independence  sitting Goal: Pt. Will Perform Lower Body Dressing Flowsheets (Taken 02/16/2021 1208) Pt Will Perform Lower Body Dressing:  with min assist  sitting/lateral leans  sit to/from stand  with adaptive equipment Goal: Pt. Will Transfer To Toilet Flowsheets (Taken 02/16/2021 1208) Pt Will Transfer to Toilet:  with min guard assist  stand pivot transfer  grab bars Goal: Pt/Caregiver Will Perform Home Exercise Program Flowsheets (Taken 02/16/2021 1208) Pt/caregiver will Perform Home Exercise Program:  Right Upper extremity  Increased ROM  Increased strength  With Supervision Goal: OT Additional ADL Goal #1 Flowsheets (Taken 02/16/2021 1208) Additional ADL Goal #1: Pt will demonstarte improved visual tracking skills by smoothly tracking an object vertically, horizontally, and laterally while seated.   Deshay Kirstein OT, MOT

## 2021-02-16 NOTE — Progress Notes (Signed)
Patient suffers from acute CVA which impairs their ability to perform daily activities like ambulating in the home. A walker will not resolve issue with performing activities of daily living. A wheelchair will allow patient to safely perform daily activities. Patient can safely propel the wheelchair in the home or has a caregiver who can provide assistance.

## 2021-02-16 NOTE — Evaluation (Signed)
Occupational Therapy Evaluation Patient Details Name: [name] [name] MRN: [id number] DOB: May 12, 1932 Today's Date: 02/16/2021    History of Present Illness HPI: [name] [name] is a 87 y.o. female with medical history significant for rheumatoid arthritis, oxygen dependent COPD from former smoking, hypertension, falls, anxiety disorder who reportedly had a fall at home with this morning and was complaining of right arm and shoulder pain.  She was noted to have multiple abrasions on the arms and knees.  She normally walks with the use of a walker.  She uses nighttime oxygen 2 L/min nasal cannula.     She reported that she fell after waking up early this morning trying to get out of bed.  She reported that for some reason her right side was not working properly.  She felt like her right arms and legs were dead.  She had decreased strength mobility and sensation in her right side.  She fell immediately upon getting up.  She injured her arms and knees. No head injury.  She is not on anticoagulation.  Family reports that she was last normal at 10 PM yesterday evening.  When she woke up at around 5 AM she could not move her right side.   Clinical Impression   Pt agreeable to OT/PT co-evaluation. Pt needing mod assist for stand pivot transfer with RW and for supine to sit bed mobility. Pt presenting with R shoulder weakness 3-/5 due to A/ROM deficits. Noted decrease in fine motor coordination via extended time to open a container. Pt unable to demonstrate convergence and struggles with track an object in all fields. No visual field cuts noted. Pt will benefit from continued OT in the hospital and recommended venue below to increase strength, balance, ROM, endurance, and fine motor coordination for safe ADL's.     Follow Up Recommendations  SNF;Supervision - Intermittent    Equipment Recommendations  None recommended by OT           Precautions / Restrictions Precautions Precautions:  Fall Restrictions Weight Bearing Restrictions: No      Mobility Bed Mobility Overal bed mobility: Needs Assistance Bed Mobility: Supine to Sit     Supine to sit: Mod assist     General bed mobility comments: Slow, labored movement.    Transfers Overall transfer level: Needs assistance Equipment used: Rolling walker (2 wheeled) Transfers: Sit to/from [employer] Sit to Stand: Mod assist Stand pivot transfers: Mod assist       General transfer comment: EOB to chair with RW    Balance Overall balance assessment: Needs assistance Sitting-balance support: No upper extremity supported;Feet supported Sitting balance-Leahy Scale: Fair (Poor to fair) Sitting balance - Comments: EOB and in chair Postural control: Right lateral lean Standing balance support: Bilateral upper extremity supported;During functional activity Standing balance-Leahy Scale: Poor Standing balance comment: Poor using RW                           ADL either performed or assessed with clinical judgement   ADL Overall ADL's : Needs assistance/impaired                         Toilet Transfer: Moderate assistance;RW Toilet Transfer Details (indicate cue type and reason): Simulated via EOB to chair with RW.                 Vision Baseline Vision/History: No visual deficits Patient Visual Report: No change  from baseline Vision Assessment?: Yes Tracking/Visual Pursuits: Other (comment) (Poor tracking in laterally, vertical, and horizontal. Pt possibly) Convergence: Impaired (comment) (Unable to converge on finger when brought to nose from midline.) Visual Fields: No apparent deficits     Perception     Praxis      Pertinent Vitals/Pain Pain Assessment: Faces Faces Pain Scale: Hurts a little bit Pain Location: "everywhere" when bed lowered Pain Descriptors / Indicators: Grimacing Pain Intervention(s): Monitored during session     Hand Dominance Right    Extremity/Trunk Assessment Upper Extremity Assessment Upper Extremity Assessment: Generalized weakness;RUE deficits/detail RUE Deficits / Details: 3-/5 R shoulder MMT due to A/ROM limitations. 3+/5 grip. 4/5 elbow flexion. RUE Sensation: WNL RUE Coordination: decreased fine motor (Slow labored movement to open toothpaste container.)   Lower Extremity Assessment Lower Extremity Assessment: Defer to PT evaluation   Cervical / Trunk Assessment Cervical / Trunk Assessment: Kyphotic   Communication Communication Communication: No difficulties   Cognition Arousal/Alertness: Awake/alert Behavior During Therapy: WFL for tasks assessed/performed Overall Cognitive Status: Within Functional Limits for tasks assessed Area of Impairment: Following commands                       Following Commands: Follows one step commands inconsistently       General Comments: Difficulty following commands for visual tracking possible.                    Home Living Family/patient expects to be discharged to:: Private residence Living Arrangements: Children;Non-relatives/Friends Available Help at Discharge: Family;Personal care attendant (Personal care aids available 8 hours a day. Family available when care aids not present.) Type of Home: House Home Access: Stairs to enter [employer] of Steps: 2-3 Entrance Stairs-Rails: Right Home Layout: One level     Bathroom Shower/Tub: Producer, television/film/video: Standard Bathroom Accessibility: No   Home Equipment: Environmental consultant - 2 wheels;Walker - 4 wheels;Bedside commode;Grab bars - toilet;Shower seat;Grab bars - tub/shower          Prior Functioning/Environment Level of Independence: Needs assistance  Gait / Transfers Assistance Needed: Had improved to the point of stransfering without physical assist using RW. ADL's / Homemaking Assistance Needed: Assist for bathing, dressing, and IADLs.            OT Problem List:  Decreased strength;Decreased range of motion;Decreased activity tolerance;Decreased coordination;Impaired UE functional use      OT Treatment/Interventions: Self-care/ADL training;Therapeutic exercise;Manual therapy;Therapeutic activities;Visual/perceptual remediation/compensation;Patient/family education;Balance training    OT Goals(Current goals can be found in the care plan section) Acute Rehab OT Goals Patient Stated Goal: Return home with family to assist. OT Goal Formulation: With patient Time For Goal Achievement: 03/02/21 Potential to Achieve Goals: Good  OT Frequency: Min 2X/week               Co-evaluation PT/OT/SLP Co-Evaluation/Treatment: Yes Reason for Co-Treatment: To address functional/ADL transfers   OT goals addressed during session: ADL's and self-care;Strengthening/ROM                       End of Session Equipment Utilized During Treatment: Rolling walker  Activity Tolerance: Patient tolerated treatment well Patient left: in chair;with chair alarm set;with family/visitor present;with call bell/phone within reach  OT Visit Diagnosis: Unsteadiness on feet (R26.81);Repeated falls (R29.6);Muscle weakness (generalized) (M62.81);History of falling (Z91.81);Hemiplegia and hemiparesis Hemiplegia - Right/Left: Right Hemiplegia - dominant/non-dominant: Dominant Hemiplegia - caused by: Other cerebrovascular disease (Atherosclerotic calcifications are present  within the cavernous internal carotid arteries bilaterally)                Time: 8527-7824 OT Time Calculation (min): 22 min Charges:  OT General Charges $OT Visit: 1 Visit OT Evaluation $OT Eval Low Complexity: 1 Low  Haley Roza OT, MOT   Danie Chandler 02/16/2021, 10:09 AM

## 2021-02-16 NOTE — Consult Note (Signed)
[facility] Alick Lecomte A. Gerilyn Pilgrim, MD     [email or web address]          [name] [name] is an 87 y.o. female.   ASSESSMENT/PLAN: 1. ACUTE RIGHT HEMIPARESIS  WITH MRI A EVIDENCE OF LEFT MCA INFARCTS:  Risk factors age and hypertension. The patient has associated left carotid extracranial stenosis which I think is the most likely etiology.  Dual antiplatelet agents are recommended along with the use of a statin. The dual antiplatelet agents should be done for 3 months and then a single agent should suffice. 2. Multivessel extracranial and intracranial occlusive disease: Plan as outlined above with dual antiplatelet agents and the use of statin medication. 3. Multifactorial gait impairment including acute ischemic stroke, osteoarthritis and aging. Physical and occupational therapies are recommended.      The patient 87 year old white female who presents with the acute onset of right-sided hemiparesis after falling at home.  The history is obtained mostly from the patient's daughter who is a Engineer, civil (consulting) and presents in the room today. The patient apparently had COVID a and of December 2021 he. It appears this was complicated by the patient developing gait impairment and global weakness. The patient had a fall during that illness and presented much the same way as she presented during this admission with a fall. She also had global weakness but no clear focal weakness at that time. She had to undergo significant physiotherapy but improved to baseline. The daughter reported that she did have cognitive impairment at that time but she returned to normal and was able to ambulate with a walker. The patient fell yesterday and was noted to have right-sided weakness and hence was brought to the emergency room for further evaluation. She is noted to have a little dysarthria on examination today but this appears to be chronic per the daughter apparently due to dentures. There are no reports of chest pain,  shortness of breath or palpitations. The patient currently resides with her son with her daughter living close by and they intend to take her home once the workup is complete. She would likely need physical therapy as she did previously. The review systems otherwise negative.     GENERAL: This is a pleasant thin female who is doing well at this time.  HEENT:  The palpebra fissure is increased on the left side relative to the right side. This apparently happened after cataract extraction procedure several years ago. Neck is supple; there is evidence of bruising of the lower part of the neck and shoulder on the left side.  ABDOMEN: soft  EXTREMITIES: No edema; the scan is quite thin with the bruising noted of the knees and upper extremities from falling.   BACK: Normal  SKIN: Normal by inspection.    MENTAL STATUS:  She is awake and alert and cooperates with evaluation. She is noted to have mild dysarthria. She does follow commands well and no evidence of aphasia is noted. She is able to name 5/5 objects at baseline with good comprehension. She is not oriented however to time or place. She was oriented to hospital with repeated prompting however. She is not oriented to the month.  CRANIAL NERVES: Pupils are equal, round and reactive to light and accomodation; extra ocular movements are full, there is no significant nystagmus; visual fields are full; upper and lower facial muscles are normal in strength and symmetric, there is no flattening of the nasolabial folds; tongue is midline; uvula is midline; shoulder elevation is normal.  MOTOR:  She has 4-/ 5 weakness of the right upper extremity especially proximally. It is associated with mild drift. The other extremities shows 4/5 strength throughout. No drift is noted in the other extremities.  COORDINATION: Left finger to nose is normal, right finger to nose is normal, No rest tremor; no intention tremor; no postural tremor; no  bradykinesia.  REFLEXES: Deep tendon reflexes are symmetrical and normal.  SENSATION: Normal to pain.   NIH stroke scale 4.   Blood pressure 139/77, pulse 88, temperature 99.4 F (37.4 C), temperature source Oral, resp. rate 18, height  (1.549 m), weight 42.4 kg, SpO2 95 %.  Past Medical History:  Diagnosis Date  . Anxiety   . Arthritis    rheumatoid  . COPD (chronic obstructive pulmonary disease) (HCC)   . Fall   . Hypertension     Past Surgical History:  Procedure Laterality Date  . APPENDECTOMY    . CATARACT EXTRACTION W/PHACO Right 11/27/2013   Procedure: CATARACT EXTRACTION PHACO AND INTRAOCULAR LENS PLACEMENT (IOC);  Surgeon: Loraine Leriche T. Nile Riggs, MD;  Location: [facility];  Service: Ophthalmology;  Laterality: Right;  CDE:7.31  . CATARACT EXTRACTION W/PHACO Left 12/11/2013   Procedure: CATARACT EXTRACTION PHACO AND INTRAOCULAR LENS PLACEMENT (IOC);  Surgeon: Loraine Leriche T. Nile Riggs, MD;  Location: [facility];  Service: Ophthalmology;  Laterality: Left;  CDE:7.94  . HIP ARTHROPLASTY Right 01/02/2019   Procedure: ARTHROPLASTY BIPOLAR HIP (HEMIARTHROPLASTY);  Surgeon: Durene Romans, MD;  Location: [facility];  Service: Orthopedics;  Laterality: Right;  . HUMERUS FRACTURE SURGERY Left    had surgery x3 on it.  . REPLACEMENT TOTAL KNEE BILATERAL      Family History  Problem Relation Age of Onset  . Hypertension Other     Social History:  reports that she has quit smoking. Her smoking use included cigarettes. She has a 60.00 pack-year smoking history. She quit smokeless tobacco use about 23 years ago. She reports current alcohol use. She reports that she does not use drugs.  Allergies:  Allergies  Allergen Reactions  . Penicillins Swelling, Anaphylaxis, Hives and Nausea And Vomiting    Has patient had a PCN reaction causing immediate rash, facial/tongue/throat swelling, SOB or lightheadedness with hypotension: Yes Has patient had a PCN reaction causing severe rash involving mucus membranes  or skin necrosis: No Has patient had a PCN reaction that required hospitalization No Has patient had a PCN reaction occurring within the last 10 years: No If all of the above answers are "NO", then may proceed with Cephalosporin use.  Has patient had a PCN reaction causing immediate rash, facial/tongue/throat swelling, SOB or lightheadedness with hypotension: Yes Has patient had a PCN reaction causing severe rash involving mucus membranes or skin necrosis: No Has patient had a PCN reaction that required hospitalization No Has patient had a PCN reaction occurring within the last 10 years: No If all of the above answers are "NO", then may proceed with Cephalosporin use.   . Sulfa Antibiotics Hives  . Cortisone Other (See Comments)    Irregular Heart Beat  . Morphine And Related Nausea And Vomiting  . Vicodin [Hydrocodone-Acetaminophen] Nausea And Vomiting  . Motrin [Ibuprofen]     Medications: Prior to Admission medications   Medication Sig Start Date End Date Taking? Authorizing Provider  ALPRAZolam Prudy Feeler) 0.5 MG tablet Take 0.25-0.5 mg by mouth 2 (two) times daily. Take 0.25 every morning and 0.5 mg every evening   Yes [provider]  amLODipine (NORVASC) 5 MG tablet Take 1 tablet (  5 mg total) by mouth daily. 06/29/18 06/27/19 Yes Johnson, Clanford L, MD  budesonide-formoterol (SYMBICORT) 160-4.5 MCG/ACT inhaler Inhale 2 puffs into the lungs 2 (two) times daily. 01/30/18  Yes [provider]  cholecalciferol (VITAMIN D) 1000 UNITS tablet Take 1,000 Units by mouth daily.   Yes [provider]  feeding supplement, ENSURE ENLIVE, (ENSURE ENLIVE) LIQD Take 237 mLs by mouth 2 (two) times daily between meals. 01/08/19  Yes Almon Hercules, MD  leflunomide (ARAVA) 20 MG tablet Take 1 tablet (20 mg total) by mouth daily. Resume 2 weeks after surgery. Patient taking differently: Take 20 mg by mouth daily. 01/02/19  Yes Babish, Molli Hazard, PA-C  metoprolol tartrate (LOPRESSOR)  25 MG tablet Take 1 tablet (25 mg total) by mouth 2 (two) times daily. 06/28/18 06/27/19 Yes Johnson, Clanford L, MD  Multiple Vitamin (MULTIVITAMIN WITH MINERALS) TABS tablet Take 1 tablet by mouth daily.   Yes [provider]  ramipril (ALTACE) 10 MG capsule Take 20 mg by mouth daily.   Yes [provider]  acetaminophen (TYLENOL) 500 MG tablet Take 2 tablets (1,000 mg total) by mouth every 8 (eight) hours. 01/02/19   [name], PA-C  albuterol (PROVENTIL HFA;VENTOLIN HFA) 108 (90 BASE) MCG/ACT inhaler Inhale 2 puffs into the lungs every 6 (six) hours as needed for wheezing or shortness of breath.    [provider]  fluticasone (FLONASE) 50 MCG/ACT nasal spray Place 2 sprays into both nostrils at bedtime.  10/12/17   [provider]  hydroxypropyl methylcellulose / hypromellose (ISOPTO TEARS / GONIOVISC) 2.5 % ophthalmic solution Place 1 drop into both eyes 3 (three) times daily as needed for dry eyes.    [provider]  senna-docusate (SENOKOT-S) 8.6-50 MG tablet Take 1 tablet by mouth at bedtime as needed for mild constipation. 01/08/19   Almon Hercules, MD    Scheduled Meds: . ALPRAZolam  0.5 mg Oral QHS  . aspirin  300 mg Rectal Daily   Or  . aspirin  325 mg Oral Daily  . atorvastatin  20 mg Oral QPM  . clopidogrel  75 mg Oral Daily  . enoxaparin (LOVENOX) injection  30 mg Subcutaneous Q24H  . feeding supplement  237 mL Oral BID BM  . fluticasone  2 spray Each Nare QHS  . leflunomide  20 mg Oral Daily  . metoprolol tartrate  25 mg Oral BID  . mometasone-formoterol  2 puff Inhalation BID  . pantoprazole  40 mg Oral Q0600   Continuous Infusions: PRN Meds:.acetaminophen **OR** acetaminophen (TYLENOL) oral liquid 160 mg/5 mL **OR** acetaminophen, albuterol, ALPRAZolam, polyvinyl alcohol, senna-docusate     Results for orders placed or performed during the hospital encounter of 02/15/21 (from the past 48 hour(s))  Ethanol     Status:  None   Collection Time: 02/15/21  7:21 AM  Result Value Ref Range   Alcohol, Ethyl (B) <10 <10 mg/dL    Comment: (NOTE) Lowest detectable limit for serum alcohol is 10 mg/dL.  For medical purposes only. Performed at [facility], [address], [place], [place] [address]   Protime-INR     Status: None   Collection Time: 02/15/21  7:21 AM  Result Value Ref Range   Prothrombin Time 12.8 11.4 - 15.2 seconds   INR 1.0 0.8 - 1.2    Comment: (NOTE) INR goal varies based on device and disease states. Performed at [facility], [address], [place], [place] [address]   APTT  Status: None   Collection Time: 02/15/21  7:21 AM  Result Value Ref Range   aPTT 27 24 - 36 seconds    Comment: Performed at [facility], [address], [place], [place] [address]  CBC     Status: Abnormal   Collection Time: 02/15/21  7:21 AM  Result Value Ref Range   WBC 6.2 4.0 - 10.5 K/uL   RBC 3.96 3.87 - 5.11 MIL/uL   Hemoglobin 11.3 (L) 12.0 - 15.0 g/dL   HCT 38.8 82.8 - 00.3 %   MCV 91.9 80.0 - 100.0 fL   MCH 28.5 26.0 - 34.0 pg   MCHC 31.0 30.0 - 36.0 g/dL   RDW 49.1 (H) 79.1 - 50.5 %   Platelets 230 150 - 400 K/uL   nRBC 0.0 0.0 - 0.2 %    Comment: Performed at [facility], [address] [place], [place] [address]  Differential     Status: None   Collection Time: 02/15/21  7:21 AM  Result Value Ref Range   Neutrophils Relative % 62 %   Neutro Abs 3.8 1.7 - 7.7 K/uL   Lymphocytes Relative 21 %   Lymphs Abs 1.3 0.7 - 4.0 K/uL   Monocytes Relative 14 %   Monocytes Absolute 0.9 0.1 - 1.0 K/uL   Eosinophils Relative 2 %   Eosinophils Absolute 0.1 0.0 - 0.5 K/uL   Basophils Relative 1 %   Basophils Absolute 0.0 0.0 - 0.1 K/uL   Immature Granulocytes 0 %   Abs Immature Granulocytes 0.02 0.00 - 0.07 K/uL    Comment: Performed at [facility], [address], [place], [place] [address]  Comprehensive metabolic panel     Status: Abnormal   Collection Time: 02/15/21  7:21 AM  Result Value Ref  Range   Sodium 136 135 - 145 mmol/L   Potassium 3.9 3.5 - 5.1 mmol/L   Chloride 104 98 - 111 mmol/L   CO2 22 22 - 32 mmol/L   Glucose, Bld 109 (H) 70 - 99 mg/dL    Comment: Glucose reference range applies only to samples taken after fasting for at least 8 hours.   BUN 14 8 - 23 mg/dL   Creatinine, Ser 5.37 0.44 - 1.00 mg/dL   Calcium 9.2 8.9 - 48.2 mg/dL   Total Protein 6.9 6.5 - 8.1 g/dL   Albumin 3.7 3.5 - 5.0 g/dL   AST 18 15 - 41 U/L   ALT 13 0 - 44 U/L   Alkaline Phosphatase 68 38 - 126 U/L   Total Bilirubin 0.8 0.3 - 1.2 mg/dL   GFR, Estimated >70 >78 mL/min    Comment: (NOTE) Calculated using the CKD-EPI Creatinine Equation (2021)    Anion gap 10 5 - 15    Comment: Performed at [facility], [address] [place], [place] [address]  Hemoglobin A1c     Status: None   Collection Time: 02/15/21  7:21 AM  Result Value Ref Range   Hgb A1c MFr Bld 4.9 4.8 - 5.6 %    Comment: (NOTE) Pre diabetes:          5.7%-6.4%  Diabetes:              >6.4%  Glycemic control for   <7.0% adults with diabetes    Mean Plasma Glucose 93.93 mg/dL    Comment: Performed at [facility], [address], [place], [place] [address]  Lipid panel     Status: None  Collection Time: 02/15/21  7:21 AM  Result Value Ref Range   Cholesterol 173 0 - 200 mg/dL   Triglycerides 97 <841 mg/dL   HDL 60 >32 mg/dL   Total CHOL/HDL Ratio 2.9 RATIO   VLDL 19 0 - 40 mg/dL   LDL Cholesterol 94 0 - 99 mg/dL    Comment:        Total Cholesterol/HDL:CHD Risk Coronary Heart Disease Risk Table                     Men   Women  1/2 Average Risk   3.4   3.3  Average Risk       5.0   4.4  2 X Average Risk   9.6   7.1  3 X Average Risk  23.4   11.0        Use the calculated Patient Ratio above and the CHD Risk Table to determine the patient's CHD Risk.        ATP III CLASSIFICATION (LDL):  <100     mg/dL   Optimal  440-102  mg/dL   Near or Above                    Optimal  130-159  mg/dL    Borderline  725-366  mg/dL   High  >440     mg/dL   Very High Performed at [facility], [address], [place], [place] [address]   I-stat chem 8, ED     Status: Abnormal   Collection Time: 02/15/21  7:25 AM  Result Value Ref Range   Sodium 138 135 - 145 mmol/L   Potassium 4.0 3.5 - 5.1 mmol/L   Chloride 106 98 - 111 mmol/L   BUN 13 8 - 23 mg/dL   Creatinine, Ser 5.95 0.44 - 1.00 mg/dL   Glucose, Bld 638 (H) 70 - 99 mg/dL    Comment: Glucose reference range applies only to samples taken after fasting for at least 8 hours.   Calcium, Ion 0.73 (LL) 1.15 - 1.40 mmol/L   TCO2 24 22 - 32 mmol/L   Hemoglobin 12.2 12.0 - 15.0 g/dL   HCT 75.6 43.3 - 29.5 %   Comment NOTIFIED PHYSICIAN   CBG monitoring, ED     Status: None   Collection Time: 02/15/21  7:25 AM  Result Value Ref Range   Glucose-Capillary 93 70 - 99 mg/dL    Comment: Glucose reference range applies only to samples taken after fasting for at least 8 hours.  Resp Panel by RT-PCR (Flu A&B, Covid) Nasopharyngeal Swab     Status: None   Collection Time: 02/15/21  8:04 AM   Specimen: Nasopharyngeal Swab; Nasopharyngeal(NP) swabs in vial transport medium  Result Value Ref Range   SARS Coronavirus 2 by RT PCR NEGATIVE NEGATIVE    Comment: (NOTE) SARS-CoV-2 target nucleic acids are NOT DETECTED.  The SARS-CoV-2 RNA is generally detectable in upper respiratory specimens during the acute phase of infection. The lowest concentration of SARS-CoV-2 viral copies this assay can detect is 138 copies/mL. A negative result does not preclude SARS-Cov-2 infection and should not be used as the sole basis for treatment or other patient management decisions. A negative result may occur with  improper specimen collection/handling, submission of specimen other than nasopharyngeal swab, presence of viral mutation(s) within the areas targeted by this assay, and inadequate number of viral copies(<138 copies/mL). A negative result must be combined  with clinical observations,  patient history, and epidemiological information. The expected result is Negative.  Fact Sheet for Patients:  BloggerCourse.com  Fact Sheet for Healthcare Providers:  SeriousBroker.it  This test is no t yet approved or cleared by the [place] FDA and  has been authorized for detection and/or diagnosis of SARS-CoV-2 by FDA under an Emergency Use Authorization (EUA). This EUA will remain  in effect (meaning this test can be used) for the duration of the COVID-19 declaration under Section 564(b)(1) of the Act, 21 U.S.C.section 360bbb-3(b)(1), unless the authorization is terminated  or revoked sooner.       Influenza A by PCR NEGATIVE NEGATIVE   Influenza B by PCR NEGATIVE NEGATIVE    Comment: (NOTE) The Xpert Xpress SARS-CoV-2/FLU/RSV plus assay is intended as an aid in the diagnosis of influenza from Nasopharyngeal swab specimens and should not be used as a sole basis for treatment. Nasal washings and aspirates are unacceptable for Xpert Xpress SARS-CoV-2/FLU/RSV testing.  Fact Sheet for Patients: BloggerCourse.com  Fact Sheet for Healthcare Providers: SeriousBroker.it  This test is not yet approved or cleared by the [place] FDA and has been authorized for detection and/or diagnosis of SARS-CoV-2 by FDA under an Emergency Use Authorization (EUA). This EUA will remain in effect (meaning this test can be used) for the duration of the COVID-19 declaration under Section 564(b)(1) of the Act, 21 U.S.C. section 360bbb-3(b)(1), unless the authorization is terminated or revoked.  Performed at [facility], [address] [place], [place] [address]   VITAMIN D 25 Hydroxy (Vit-D Deficiency, Fractures)     Status: Abnormal   Collection Time: 02/15/21 10:35 AM  Result Value Ref Range   Vit D, 25-Hydroxy 107.20 (H) 30 - 100 ng/mL    Comment:  (NOTE) Vitamin D deficiency has been defined by the Institute of Medicine  and an Endocrine Society practice guideline as a level of serum 25-OH  vitamin D less than 20 ng/mL (1,2). The Endocrine Society went on to  further define vitamin D insufficiency as a level between 21 and 29  ng/mL (2).  1. IOM (Institute of Medicine). 2010. Dietary reference intakes for  calcium and D. [place]: [employer]. 2. Holick MF, Binkley [place], Bischoff-Ferrari HA, et al. Evaluation,  treatment, and prevention of vitamin D deficiency: an Endocrine  Society clinical practice guideline, JCEM. 2011 Jul; 96(7): 1911-30.  Performed at [facility], [address] [place], [place] [address]   CBC     Status: Abnormal   Collection Time: 02/16/21  6:33 AM  Result Value Ref Range   WBC 5.2 4.0 - 10.5 K/uL   RBC 3.40 (L) 3.87 - 5.11 MIL/uL   Hemoglobin 9.7 (L) 12.0 - 15.0 g/dL   HCT 09.8 (L) 11.9 - 14.7 %   MCV 91.5 80.0 - 100.0 fL   MCH 28.5 26.0 - 34.0 pg   MCHC 31.2 30.0 - 36.0 g/dL   RDW 82.9 (H) 56.2 - 13.0 %   Platelets 199 150 - 400 K/uL   nRBC 0.0 0.0 - 0.2 %    Comment: Performed at [facility], [address] [place], [place] [address]  Comprehensive metabolic panel     Status: Abnormal   Collection Time: 02/16/21  6:33 AM  Result Value Ref Range   Sodium 136 135 - 145 mmol/L   Potassium 3.7 3.5 - 5.1 mmol/L   Chloride 107 98 - 111 mmol/L   CO2 20 (L) 22 - 32 mmol/L   Glucose,  Bld 106 (H) 70 - 99 mg/dL    Comment: Glucose reference range applies only to samples taken after fasting for at least 8 hours.   BUN 15 8 - 23 mg/dL   Creatinine, Ser 6.04 0.44 - 1.00 mg/dL   Calcium 8.5 (L) 8.9 - 10.3 mg/dL   Total Protein 5.6 (L) 6.5 - 8.1 g/dL   Albumin 2.9 (L) 3.5 - 5.0 g/dL   AST 16 15 - 41 U/L   ALT 12 0 - 44 U/L   Alkaline Phosphatase 55 38 - 126 U/L   Total Bilirubin 0.6 0.3 - 1.2 mg/dL   GFR, Estimated >54 >09 mL/min    Comment: (NOTE) Calculated using the  CKD-EPI Creatinine Equation (2021)    Anion gap 9 5 - 15    Comment: Performed at [facility], [address] [place], [place] [address]    Studies/Results:  BRAIN MRI MRA  FINDINGS: MRI HEAD FINDINGS  Brain: There are scattered small acute cortical and subcortical infarcts in the left frontal, left parietal, and lateral left occipital lobes (MCA and watershed territories). Confluent T2 hyperintensities in the cerebral white matter bilaterally and in the pons are nonspecific but compatible with severe chronic small vessel ischemic disease. There is moderate cerebral atrophy. No intracranial hemorrhage, mass, midline shift, or extra-axial fluid collection is identified.  Vascular: Major intracranial vascular flow voids are preserved.  Skull and upper cervical spine: Unremarkable bone marrow signal.  Sinuses/Orbits: Bilateral cataract extraction. Paranasal sinuses and mastoid air cells are clear.  Other: None.  MRA HEAD FINDINGS  The visualized distal left vertebral artery is widely patent and strongly dominant, supplying the basilar. The right vertebral artery is small and ends in PICA. Patent PICA and SCA origins are seen bilaterally. The basilar artery is widely patent. There are left larger than right posterior communicating arteries. Both PCAs are patent with distal branch vessel attenuation and irregularity but no evidence of a significant proximal stenosis.  The internal carotid arteries are patent from skull base to carotid termini without evidence of a significant stenosis. ACAs and MCAs are patent without evidence of a significant A1 or M1 stenosis. Segmental regions of signal loss involving MCA branch vessels bilaterally are favored to be artifactual given normal appearance on yesterday's CTA. No aneurysm is identified.  IMPRESSION: 1. Scattered small acute infarcts in the left cerebral hemisphere suggestive of unilateral emboli. 2. Severe chronic  small vessel ischemic disease. 3. No major intracranial arterial occlusion or significant proximal Stenosis.     HEAD NECK CTA FINDINGS: CTA NECK FINDINGS  Aortic arch: Atherosclerotic calcifications are present within the aortic arch at the origins the great vessels no significant stenosis is present at the arch. No aneurysm is present.  Right carotid system: Dense calcifications are present in the proximal right common carotid artery without significant stenosis at the bifurcation. Additional mural calcifications are present without stenosis. Dense atherosclerotic calcifications are present at carotid bifurcation. Lumen of the proximal right ICA is narrowed to 1.6 mm. This compares with a more distal measurement 4.1 mm. The cervical right ICA is otherwise within normal limits.  Left carotid system: The left common carotid artery demonstrates extensive mural calcifications without significant luminal stenosis. Dense circumferential calcification is present just beyond the bifurcation. Minimal luminal diameter is 2.5 mm. This compares with more distal normal segment of 5.3 mm.  Vertebral arteries: The left vertebral artery is the dominant vessel. Right vertebral artery is hypoplastic. High-grade stenosis is present at the origin of the  dominant left vertebral artery. No additional stenoses are present in either vertebral artery in the neck. Moderate tortuosity is present in the distal V2 segment bilaterally without stenosis.  Skeleton: Multilevel degenerative changes are present. Degenerative anterolisthesis present at C4-5 and C5-6. Slight retrolisthesis present at C3-4. No focal lytic or blastic lesions are present. Exaggerated cervical kyphosis noted. Patient is edentulous.  Other neck: Soft tissues of the neck otherwise unremarkable.  Upper chest: Centrilobular emphysematous changes present at both lung apices. No focal nodule or mass lesion is present. No  focal airspace disease is evident.  Review of the MIP images confirms the above findings  CTA HEAD FINDINGS  Anterior circulation: Atherosclerotic calcifications are present within the cavernous internal carotid arteries bilaterally without focal stenosis through the ICA termini. The A1 and M1 segments are normal. The anterior communicating artery is patent. MCA bifurcations are normal. ACA and MCA branch vessels are within normal limits.  Posterior circulation: Hypoplastic right vertebral artery terminates at the PICA. Left vertebral artery becomes the basilar artery. PICA origin is visualized normal. Basilar artery is within normal limits. Both posterior cerebral arteries originate from basilar tip. Left posterior communicating artery contributes. The PCA branch vessels are within normal limits bilaterally.  Venous sinuses: Dural sinuses are patent. Right transverse sinus is dominant. Straight sinus deep cerebral veins are intact. Cortical veins are unremarkable.  Anatomic variants: None  Review of the MIP images confirms the above findings  IMPRESSION: 1. No emergent large vessel occlusion. 2. 60% stenosis of the proximal right internal carotid artery. Lumen is narrowed to 1.6 mm. 3. 60% stenosis of the proximal left internal carotid artery. Lumen is narrowed to 2.5 mm. 4. High-grade stenosis at the origin of the dominant left vertebral artery. 5. Atherosclerotic changes within the cavernous internal carotid arteries bilaterally without significant stenosis. 6. Multilevel spondylosis of the cervical spine. 7. Atherosclerotic changes at the origins the great vessels in throughout the common carotid arteries bilaterally left greater than right without focal stenosis. 8. Aortic Atherosclerosis (ICD10-I70.0) and Emphysema (ICD10-J43.9).      The brain MRI scan is reviewed in person and shows several small infarcts involving the centrum semiovale on the left  frontal area. There is also a few involving the lateral temporal lobe is juxtacortical also on the left side.   Cindy Brindisi A. Gerilyn Pilgrim, M.D.  Diplomate, Biomedical engineer of Psychiatry and Neurology ( Neurology). 02/16/2021, 6:35 PM

## 2021-02-16 NOTE — TOC Initial Note (Signed)
Transition of Care [facility]) - Initial/Assessment Note    Patient Details  Name: [name] [name] MRN: [id number] Date of Birth: 1932/06/22  Transition of Care [facility]) CM/SW Contact:    Karn Cassis, LCSW Phone Number: 02/16/2021, 3:08 PM  Clinical Narrative:  Pt admitted due to acute CVA. LCSW spoke with pt's daughter, Arline Asp who reports that pt lives with son and has sitters 8 hours a day through [place]. Arline Asp assists during evening hours. Pt is on 2L O2 at night. Arline Asp states pt is active with [facility] palliative services. PT evaluated pt and recommend SNF. LCSW discussed with Cindy. She talked to her siblings and they have decided to take pt back home with home health. They request [facility]. Referred to [facility] with [facility] who accepts for PT/OT. Arline Asp also requests wheelchair. Orders in and referred to [employer] who will drop ship wheelchair. Cindy updated. TOC will continue to follow.                   Expected Discharge Plan: Home w Home Health Services Barriers to Discharge: Continued Medical Work up   Patient Goals and CMS Choice Patient states their goals for this hospitalization and ongoing recovery are:: return home   Choice offered to / list presented to : Adult Children  Expected Discharge Plan and Services Expected Discharge Plan: Home w Home Health Services In-house Referral: Clinical Social Work   Post Acute Care Choice: Home Health Living arrangements for the past 2 months: Single Family Home                 DME Arranged: Wheelchair manual DME Agency: [employer] Date DME Agency Contacted: 02/16/21 Time DME Agency Contacted: 1506 Representative spoke with at DME Agency: [facility] HH Arranged: OT,PT HH Agency: [facility] Date [facility] Agency Contacted: 02/16/21 Time HH Agency Contacted: 1506 Representative spoke with at [facility] Agency: Kandee Keen  Prior Living Arrangements/Services Living arrangements for the past 2 months: Single Family Home Lives with:: Adult  Children Patient language and need for interpreter reviewed:: Yes Do you feel safe going back to the place where you live?: Yes      Need for Family Participation in Patient Care: Yes (Comment) Care giver support system in place?: Yes (comment) Current home services: DME,Sitter (Rollator, walker, BSC, O2, shower chair) Criminal Activity/Legal Involvement Pertinent to Current Situation/Hospitalization: No - Comment as needed  Activities of Daily Living Home Assistive Devices/Equipment: Built-in shower seat,Walker (specify type) ADL Screening (condition at time of admission) Patient's cognitive ability adequate to safely complete daily activities?: Yes Is the patient deaf or have difficulty hearing?: No Does the patient have difficulty seeing, even when wearing glasses/contacts?: No Does the patient have difficulty concentrating, remembering, or making decisions?: No Patient able to express need for assistance with ADLs?: Yes Does the patient have difficulty dressing or bathing?: Yes Independently performs ADLs?: No Communication: Independent Dressing (OT): Needs assistance Is this a change from baseline?: Pre-admission baseline Grooming: Needs assistance Is this a change from baseline?: Pre-admission baseline Feeding: Independent Bathing: Needs assistance Is this a change from baseline?: Pre-admission baseline Toileting: Needs assistance Is this a change from baseline?: Pre-admission baseline In/Out Bed: Needs assistance Is this a change from baseline?: Pre-admission baseline Walks in Home: Needs assistance Is this a change from baseline?: Pre-admission baseline Does the patient have difficulty walking or climbing stairs?: Yes Weakness of Legs: Both Weakness of Arms/Hands: Both  Permission Sought/Granted         Permission granted to share info  w [facility]  Permission granted to share info w Relationship: Home health     Emotional Assessment         Alcohol /  Substance Use: Not Applicable Psych Involvement: No (comment)  Admission diagnosis:  TIA (transient ischemic attack) [G45.9] Acute ischemic stroke (HCC) [I63.9] Skin tear of elbow without complication, unspecified laterality, initial encounter [S51.019A] Acute CVA (cerebrovascular accident) [facility]) [I63.9] Patient Active Problem List   Diagnosis Date Noted  . Acute CVA (cerebrovascular accident) (HCC) 02/15/2021  . Fall at home 02/15/2021  . Former smoker   . Postoperative fever 01/03/2019  . Hypokalemia 01/03/2019  . History of rheumatoid arthritis 01/03/2019  . Closed right hip fracture, initial encounter (HCC) 12/31/2018  . Hypertension 06/27/2018  . COPD (chronic obstructive pulmonary disease) (HCC) 06/27/2018  . Anxiety 06/27/2018  . Headache 06/27/2018   PCP:  Richmond Campbell., PA-C Pharmacy:   [facility] [phone] - [place], [place] - [address] [place] [address] Phone: [phone] Fax: [phone]  [facility] [phone] - [place], [place] - [address] AT [facility] [address] [place] [address] Phone: [phone] Fax: [phone]     Social Determinants of Health (SDOH) Interventions    Readmission Risk Interventions No flowsheet data found.

## 2021-02-16 NOTE — Plan of Care (Signed)

## 2021-02-16 NOTE — Evaluation (Signed)
Physical Therapy Evaluation Patient Details Name: [name] [name] MRN: [id number] DOB: [birth year] Today's Date: 02/16/2021   History of Present Illness  HPI: [name] [name] is a 87 y.o. female with medical history significant for rheumatoid arthritis, oxygen dependent COPD from former smoking, hypertension, falls, anxiety disorder who reportedly had a fall at home with this morning and was complaining of right arm and shoulder pain.  She was noted to have multiple abrasions on the arms and knees.  She normally walks with the use of a walker.  She uses nighttime oxygen 2 L/min nasal cannula.     She reported that she fell after waking up early this morning trying to get out of bed.  She reported that for some reason her right side was not working properly.  She felt like her right arms and legs were dead.  She had decreased strength mobility and sensation in her right side.  She fell immediately upon getting up.  She injured her arms and knees. No head injury.  She is not on anticoagulation.  Family reports that she was last normal at 10 PM yesterday evening.  When she woke up at around 5 AM she could not move her right side.    Clinical Impression  Patient presents with right sided weakness requiring Mod assist for supine to sitting with fair/poor carryover for propping up on elbows due to weakness, very unsteady on feet at high risk for falls, limited to a few slow labored shuffling side steps before having to sit due to fatigue.  Patient tolerated sitting up in chair with her family member present in room after therapy.  Patient will benefit from continued physical therapy in hospital and recommended venue below to increase strength, balance, endurance for safe ADLs and gait.     Follow Up Recommendations SNF;Supervision for mobility/OOB;Supervision/Assistance - 24 hour    Equipment Recommendations       Recommendations for Other Services       Precautions / Restrictions  Precautions Precautions: Fall Restrictions Weight Bearing Restrictions: No      Mobility  Bed Mobility Overal bed mobility: Needs Assistance Bed Mobility: Supine to Sit     Supine to sit: Mod assist     General bed mobility comments: Slow, labored movement.    Transfers Overall transfer level: Needs assistance Equipment used: Rolling walker (2 wheeled) Transfers: Sit to/from [employer] Sit to Stand: Min guard Stand pivot transfers: Min guard       General transfer comment: increased time, labored movement, frequent verbal cues for proper hand placement during sit to stands  Ambulation/Gait Ambulation/Gait assistance: Mod assist;Max assist Gait Distance (Feet): 4 Feet Assistive device: Rolling walker (2 wheeled) Gait Pattern/deviations: Decreased step length - right;Decreased step length - left;Decreased stride length;Shuffle Gait velocity: decreased   General Gait Details: limited to 4-5 slow labored shuffling steps at bedside due to  weakness, poor stainding balance  Stairs            Wheelchair Mobility    Modified Rankin (Stroke Patients Only)       Balance Overall balance assessment: Needs assistance Sitting-balance support: Feet supported;No upper extremity supported Sitting balance-Leahy Scale: Fair Sitting balance - Comments: EOB and in chair Postural control: Right lateral lean Standing balance support: During functional activity;Bilateral upper extremity supported Standing balance-Leahy Scale: Poor Standing balance comment: Poor using RW  Pertinent Vitals/Pain Pain Assessment: Faces Faces Pain Scale: Hurts a little bit Pain Location: "everywhere" when bed lowered Pain Descriptors / Indicators: Grimacing Pain Intervention(s): Limited activity within patient's tolerance;Monitored during session;Repositioned    Home Living Family/patient expects to be discharged to:: Private  residence Living Arrangements: Children;Non-relatives/Friends Available Help at Discharge: Family;Personal care attendant Type of Home: House Home Access: Stairs to enter Entrance Stairs-Rails: Right Entrance Stairs-Number of Steps: 2-3 Home Layout: One level Home Equipment: Environmental consultant - 2 wheels;Walker - 4 wheels;Bedside commode;Grab bars - toilet;Shower seat;Grab bars - tub/shower      Prior Function Level of Independence: Needs assistance   Gait / Transfers Assistance Needed: Supervised short distanced household ambulator using RW  ADL's / Homemaking Assistance Needed: Assist for bathing, dressing, and IADLs.        Hand Dominance   Dominant Hand: Right    Extremity/Trunk Assessment   Upper Extremity Assessment Upper Extremity Assessment: Defer to OT evaluation    Lower Extremity Assessment Lower Extremity Assessment: Generalized weakness;RLE deficits/detail;LLE deficits/detail RLE Deficits / Details: grossly 3+/5 RLE Sensation: WNL RLE Coordination: WNL LLE Deficits / Details: grossly 4/5 LLE Sensation: WNL LLE Coordination: WNL    Cervical / Trunk Assessment Cervical / Trunk Assessment: Kyphotic  Communication   Communication: No difficulties  Cognition Arousal/Alertness: Awake/alert Behavior During Therapy: WFL for tasks assessed/performed Overall Cognitive Status: Within Functional Limits for tasks assessed Area of Impairment: Following commands                       Following Commands: Follows one step commands inconsistently       General Comments: requires repeated verbal/tactile cueing to complete most tasks      General Comments      Exercises     Assessment/Plan    PT Assessment Patient needs continued PT services  PT Problem List Decreased strength;Decreased activity tolerance;Decreased balance;Decreased mobility       PT Treatment Interventions DME instruction;Gait training;Stair training;Functional mobility  training;Therapeutic activities;Balance training;Patient/family education;Therapeutic exercise    PT Goals (Current goals can be found in the Care Plan section)  Acute Rehab PT Goals Patient Stated Goal: Return home with family to assist. PT Goal Formulation: With patient/family Time For Goal Achievement: 03/02/21 Potential to Achieve Goals: Good    Frequency Min 3X/week   Barriers to discharge        Co-evaluation PT/OT/SLP Co-Evaluation/Treatment: Yes Reason for Co-Treatment: Complexity of the patient's impairments (multi-system involvement);For patient/therapist safety PT goals addressed during session: Mobility/safety with mobility;Proper use of DME;Balance         AM-PAC PT "6 Clicks" Mobility  Outcome Measure Help needed turning from your back to your side while in a flat bed without using bedrails?: A Lot Help needed moving from lying on your back to sitting on the side of a flat bed without using bedrails?: A Lot Help needed moving to and from a bed to a chair (including a wheelchair)?: A Lot Help needed standing up from a chair using your arms (e.g., wheelchair or bedside chair)?: A Lot Help needed to walk in hospital room?: A Lot Help needed climbing 3-5 steps with a railing? : Total 6 Click Score: 11    End of Session   Activity Tolerance: Patient tolerated treatment well;Patient limited by fatigue Patient left: in chair;with call bell/phone within reach;with family/visitor present;with chair alarm set Nurse Communication: Mobility status PT Visit Diagnosis: Unsteadiness on feet (R26.81);Other abnormalities of gait and mobility (R26.89);Muscle weakness (generalized) (M62.81)  Time: 1610-9604 PT Time Calculation (min) (ACUTE ONLY): 32 min   Charges:   PT Evaluation $PT Eval Moderate Complexity: 1 Mod PT Treatments $Therapeutic Activity: 23-37 mins        2:48 PM, 02/16/21 Ocie Bob, MPT Physical Therapist with [facility] [address]  [phone] office [phone] mobile phone

## 2021-02-17 MED ORDER — PANTOPRAZOLE SODIUM 40 MG PO TBEC: 40.0000 mg | DELAYED_RELEASE_TABLET | Freq: Every day | ORAL | 2 refills | Status: AC

## 2021-02-17 MED ORDER — LEFLUNOMIDE 20 MG PO TABS: 20.0000 mg | ORAL_TABLET | Freq: Every day | ORAL | Status: AC

## 2021-02-17 MED ORDER — ASPIRIN EC 81 MG PO TBEC: 81.0000 mg | DELAYED_RELEASE_TABLET | Freq: Every day | ORAL | 11 refills | Status: AC

## 2021-02-17 MED ORDER — CLOPIDOGREL BISULFATE 75 MG PO TABS: 75.0000 mg | ORAL_TABLET | Freq: Every day | ORAL | 2 refills | Status: AC

## 2021-02-17 MED ORDER — ATORVASTATIN CALCIUM 20 MG PO TABS: 20.0000 mg | ORAL_TABLET | Freq: Every evening | ORAL | 2 refills | Status: AC

## 2021-02-17 NOTE — Discharge Summary (Signed)
Physician Discharge Summary  [name] [name] ZOX:[id number] DOB: Jun 11, 1932 DOA: 02/15/2021  PCP: Richmond Campbell., PA-C  Admit date: 02/15/2021 Discharge date: 02/17/2021  Admitted From:  HOME  Disposition: HOME WITH HH   Recommendations for Outpatient Follow-up:  1. Follow up with PCP in 2 weeks 2. Follow up with neurology in 1 month  Home Health: PT, OT, SLP, [facility]   Discharge Condition: STABLE   CODE STATUS: DNR DIET: Heart Healthy    Brief Hospitalization Summary: Please see all hospital notes, images, labs for full details of the hospitalization. ADMISSION HPI:  [name] [name] is a 87 y.o. female with medical history significant for rheumatoid arthritis, oxygen dependent COPD from former smoking, hypertension, falls, anxiety disorder who reportedly had a fall at home with this morning and was complaining of right arm and shoulder pain.  She was noted to have multiple abrasions on the arms and knees.  She normally walks with the use of a walker.  She uses nighttime oxygen 2 L/min nasal cannula.  She reported that she fell after waking up early this morning trying to get out of bed.  She reported that for some reason her right side was not working properly.  She felt like her right arms and legs were dead.  She had decreased strength mobility and sensation in her right side.  She fell immediately upon getting up.  She injured her arms and knees. No head injury.  She is not on anticoagulation.  Family reports that she was last normal at 10 PM yesterday evening.  When she woke up at around 5 AM she could not move her right side.  ED Course: Patient was initially worked up for fall with x-rays of the humerus bones with no findings of acute fracture.  CT head without contrast reported advanced chronic white matter disease with no acute cortically based infarct identified, but there are two punctate calcified foci about the left superior frontal lobe which are new since February and  suspicious for thromboembolic plaque in the setting of new right side weakness. Noncontrast Brain MRI would be valuable if feasible.  Patient was seen by the telemetry neurology service and they recommended further inpatient work-up including an MRI of brain and MRA and 2D echocardiogram with bubble study.  They also recommended a full stroke work-up.  Aspirin 325 mg daily was recommended for antiplatelet therapy.  Hospital Course   Acute CVA - admitted for full stroke work-up. MRI brain without contrast and MRA positive for acute CVA, possibly embolic. Pt on aspirin and plavix as recommended by neurology for 3 months of DAPT then will continue with one agent.  Also added atorvastatin 20 mg daily.  2D echocardiogram with bubble study noted below.  Allowed permissive hypertension.  Resuming home BP meds at discharge.  Home Health PT/OT/SLP was recommended and ordered. Wheelchair ordered.    Oxygen dependent COPD-not in acute exacerbation continue supplemental oxygen as needed. Bronchodilators ordered as needed.  Essential hypertension-allow permissive hypertension in setting ofacute CVA. Slowly resuming home BP meds, metoprolol has been restarted.   Restart all home meds at discharge.   PVD/Carotid artery disease - started atorvastatin 20 mg daily.     Fall at home - Pt remains high fall risk - Fall precautions. Home health PT/OT/SLP ordered at discharge.    GAD - stable.   DNR present on admission - continue DNR order while in hospital.  DVT prophylaxis:enoxaparin Code Status:DNR Family Communication:Daughter Cindy at bedside Disposition Plan:anticipate home with  home health Consults called:neurology  Discharge Diagnoses:  Principal Problem:   Acute CVA (cerebrovascular accident) [facility]) Active Problems:   Hypertension   COPD (chronic obstructive pulmonary disease) (HCC)   Anxiety   History of rheumatoid arthritis   Fall at home   Former  smoker  Discharge Instructions:  Allergies as of 02/17/2021      Reactions   Penicillins Swelling, Anaphylaxis, Hives, Nausea And Vomiting   Has patient had a PCN reaction causing immediate rash, facial/tongue/throat swelling, SOB or lightheadedness with hypotension: Yes Has patient had a PCN reaction causing severe rash involving mucus membranes or skin necrosis: No Has patient had a PCN reaction that required hospitalization No Has patient had a PCN reaction occurring within the last 10 years: No If all of the above answers are "NO", then may proceed with Cephalosporin use. Has patient had a PCN reaction causing immediate rash, facial/tongue/throat swelling, SOB or lightheadedness with hypotension: Yes Has patient had a PCN reaction causing severe rash involving mucus membranes or skin necrosis: No Has patient had a PCN reaction that required hospitalization No Has patient had a PCN reaction occurring within the last 10 years: No If all of the above answers are "NO", then may proceed with Cephalosporin use.   Sulfa Antibiotics Hives   Cortisone Other (See Comments)   Irregular Heart Beat   Morphine And Related Nausea And Vomiting   Vicodin [hydrocodone-acetaminophen] Nausea And Vomiting   Motrin [ibuprofen]       Medication List    TAKE these medications   acetaminophen 500 MG tablet Commonly known as: TYLENOL Take 2 tablets (1,000 mg total) by mouth every 8 (eight) hours.   albuterol 108 (90 Base) MCG/ACT inhaler Commonly known as: VENTOLIN HFA Inhale 2 puffs into the lungs every 6 (six) hours as needed for wheezing or shortness of breath.   ALPRAZolam 0.5 MG tablet Commonly known as: XANAX Take 0.25-0.5 mg by mouth 2 (two) times daily. Take 0.25 every morning and 0.5 mg every evening   amLODipine 5 MG tablet Commonly known as: NORVASC Take 1 tablet (5 mg total) by mouth daily.   aspirin EC 81 MG tablet Take 1 tablet (81 mg total) by mouth daily. Swallow whole.    atorvastatin 20 MG tablet Commonly known as: LIPITOR Take 1 tablet (20 mg total) by mouth every evening.   budesonide-formoterol 160-4.5 MCG/ACT inhaler Commonly known as: SYMBICORT Inhale 2 puffs into the lungs 2 (two) times daily.   cholecalciferol 1000 units tablet Commonly known as: VITAMIN D Take 1,000 Units by mouth daily.   clopidogrel 75 MG tablet Commonly known as: PLAVIX Take 1 tablet (75 mg total) by mouth daily. Start taking on: February 18, 2021   feeding supplement Liqd Take 237 mLs by mouth 2 (two) times daily between meals.   fluticasone 50 MCG/ACT nasal spray Commonly known as: FLONASE Place 2 sprays into both nostrils at bedtime.   hydroxypropyl methylcellulose / hypromellose 2.5 % ophthalmic solution Commonly known as: ISOPTO TEARS / GONIOVISC Place 1 drop into both eyes 3 (three) times daily as needed for dry eyes.   leflunomide 20 MG tablet Commonly known as: ARAVA Take 1 tablet (20 mg total) by mouth daily.   metoprolol tartrate 25 MG tablet Commonly known as: LOPRESSOR Take 1 tablet (25 mg total) by mouth 2 (two) times daily.   multivitamin with minerals Tabs tablet Take 1 tablet by mouth daily.   pantoprazole 40 MG tablet Commonly known as: PROTONIX Take 1 tablet (40  mg total) by mouth daily at 6 (six) AM. Start taking on: February 18, 2021   ramipril 10 MG capsule Commonly known as: ALTACE Take 20 mg by mouth daily.   senna-docusate 8.6-50 MG tablet Commonly known as: Senokot-S Take 1 tablet by mouth at bedtime as needed for mild constipation.            Durable Medical Equipment  (From admission, onward)         Start     Ordered   02/16/21 1128  For home use only DME standard manual wheelchair with seat cushion  Once       Comments: 16" with elevating foot rests, antitippers, brake extenders, and basic foam cushion  Patient suffers from stroke which impairs their ability to perform daily activities like bathing, dressing, feeding,  grooming, and toileting in the home.  A cane, crutch, or walker will not resolve issue with performing activities of daily living. A wheelchair will allow patient to safely perform daily activities. Patient can safely propel the wheelchair in the home or has a caregiver who can provide assistance. Length of need Lifetime. Accessories: elevating leg rests (ELRs), wheel locks, extensions and anti-tippers.   02/16/21 1128          Follow-up Information    Richmond Campbell., PA-C. Schedule an appointment as soon as possible for a visit in 2 week(s).   Specialty: Family Medicine Contact information: [address] [place] [address] [phone]        Beryle Beams, MD. Schedule an appointment as soon as possible for a visit in 1 month(s).   Specialty: Neurology Why: Stroke follow up  Contact information: [address] [place] [address] [phone]              Allergies  Allergen Reactions  . Penicillins Swelling, Anaphylaxis, Hives and Nausea And Vomiting    Has patient had a PCN reaction causing immediate rash, facial/tongue/throat swelling, SOB or lightheadedness with hypotension: Yes Has patient had a PCN reaction causing severe rash involving mucus membranes or skin necrosis: No Has patient had a PCN reaction that required hospitalization No Has patient had a PCN reaction occurring within the last 10 years: No If all of the above answers are "NO", then may proceed with Cephalosporin use.  Has patient had a PCN reaction causing immediate rash, facial/tongue/throat swelling, SOB or lightheadedness with hypotension: Yes Has patient had a PCN reaction causing severe rash involving mucus membranes or skin necrosis: No Has patient had a PCN reaction that required hospitalization No Has patient had a PCN reaction occurring within the last 10 years: No If all of the above answers are "NO", then may proceed with Cephalosporin use.   . Sulfa Antibiotics Hives   . Cortisone Other (See Comments)    Irregular Heart Beat  . Morphine And Related Nausea And Vomiting  . Vicodin [Hydrocodone-Acetaminophen] Nausea And Vomiting  . Motrin [Ibuprofen]    Allergies as of 02/17/2021      Reactions   Penicillins Swelling, Anaphylaxis, Hives, Nausea And Vomiting   Has patient had a PCN reaction causing immediate rash, facial/tongue/throat swelling, SOB or lightheadedness with hypotension: Yes Has patient had a PCN reaction causing severe rash involving mucus membranes or skin necrosis: No Has patient had a PCN reaction that required hospitalization No Has patient had a PCN reaction occurring within the last 10 years: No If all of the above answers are "NO", then may proceed with Cephalosporin use. Has patient  had a PCN reaction causing immediate rash, facial/tongue/throat swelling, SOB or lightheadedness with hypotension: Yes Has patient had a PCN reaction causing severe rash involving mucus membranes or skin necrosis: No Has patient had a PCN reaction that required hospitalization No Has patient had a PCN reaction occurring within the last 10 years: No If all of the above answers are "NO", then may proceed with Cephalosporin use.   Sulfa Antibiotics Hives   Cortisone Other (See Comments)   Irregular Heart Beat   Morphine And Related Nausea And Vomiting   Vicodin [hydrocodone-acetaminophen] Nausea And Vomiting   Motrin [ibuprofen]       Medication List    TAKE these medications   acetaminophen 500 MG tablet Commonly known as: TYLENOL Take 2 tablets (1,000 mg total) by mouth every 8 (eight) hours.   albuterol 108 (90 Base) MCG/ACT inhaler Commonly known as: VENTOLIN HFA Inhale 2 puffs into the lungs every 6 (six) hours as needed for wheezing or shortness of breath.   ALPRAZolam 0.5 MG tablet Commonly known as: XANAX Take 0.25-0.5 mg by mouth 2 (two) times daily. Take 0.25 every morning and 0.5 mg every evening   amLODipine 5 MG tablet Commonly  known as: NORVASC Take 1 tablet (5 mg total) by mouth daily.   aspirin EC 81 MG tablet Take 1 tablet (81 mg total) by mouth daily. Swallow whole.   atorvastatin 20 MG tablet Commonly known as: LIPITOR Take 1 tablet (20 mg total) by mouth every evening.   budesonide-formoterol 160-4.5 MCG/ACT inhaler Commonly known as: SYMBICORT Inhale 2 puffs into the lungs 2 (two) times daily.   cholecalciferol 1000 units tablet Commonly known as: VITAMIN D Take 1,000 Units by mouth daily.   clopidogrel 75 MG tablet Commonly known as: PLAVIX Take 1 tablet (75 mg total) by mouth daily. Start taking on: February 18, 2021   feeding supplement Liqd Take 237 mLs by mouth 2 (two) times daily between meals.   fluticasone 50 MCG/ACT nasal spray Commonly known as: FLONASE Place 2 sprays into both nostrils at bedtime.   hydroxypropyl methylcellulose / hypromellose 2.5 % ophthalmic solution Commonly known as: ISOPTO TEARS / GONIOVISC Place 1 drop into both eyes 3 (three) times daily as needed for dry eyes.   leflunomide 20 MG tablet Commonly known as: ARAVA Take 1 tablet (20 mg total) by mouth daily.   metoprolol tartrate 25 MG tablet Commonly known as: LOPRESSOR Take 1 tablet (25 mg total) by mouth 2 (two) times daily.   multivitamin with minerals Tabs tablet Take 1 tablet by mouth daily.   pantoprazole 40 MG tablet Commonly known as: PROTONIX Take 1 tablet (40 mg total) by mouth daily at 6 (six) AM. Start taking on: February 18, 2021   ramipril 10 MG capsule Commonly known as: ALTACE Take 20 mg by mouth daily.   senna-docusate 8.6-50 MG tablet Commonly known as: Senokot-S Take 1 tablet by mouth at bedtime as needed for mild constipation.            Durable Medical Equipment  (From admission, onward)         Start     Ordered   02/16/21 1128  For home use only DME standard manual wheelchair with seat cushion  Once       Comments: 16" with elevating foot rests, antitippers, brake  extenders, and basic foam cushion  Patient suffers from stroke which impairs their ability to perform daily activities like bathing, dressing, feeding, grooming, and toileting in the home.  A cane, crutch, or walker will not resolve issue with performing activities of daily living. A wheelchair will allow patient to safely perform daily activities. Patient can safely propel the wheelchair in the home or has a caregiver who can provide assistance. Length of need Lifetime. Accessories: elevating leg rests (ELRs), wheel locks, extensions and anti-tippers.   02/16/21 1128          Procedures/Studies: CT ANGIO HEAD W OR WO CONTRAST  Result Date: 02/15/2021 CLINICAL DATA:  Neuro deficit, acute, stroke. Acute visual changes. Changes to NIH score. EXAM: CT ANGIOGRAPHY HEAD AND NECK TECHNIQUE: Multidetector CT imaging of the head and neck was performed using the standard protocol during bolus administration of intravenous contrast. Multiplanar CT image reconstructions and MIPs were obtained to evaluate the vascular anatomy. Carotid stenosis measurements (when applicable) are obtained utilizing NASCET criteria, using the distal internal carotid diameter as the denominator. CONTRAST:  75mL OMNIPAQUE IOHEXOL 350 MG/ML SOLN COMPARISON:  CT head without contrast 02/15/2021. Bilateral carotid Doppler study 02/15/2021 FINDINGS: CTA NECK FINDINGS Aortic arch: Atherosclerotic calcifications are present within the aortic arch at the origins the great vessels no significant stenosis is present at the arch. No aneurysm is present. Right carotid system: Dense calcifications are present in the proximal right common carotid artery without significant stenosis at the bifurcation. Additional mural calcifications are present without stenosis. Dense atherosclerotic calcifications are present at carotid bifurcation. Lumen of the proximal right ICA is narrowed to 1.6 mm. This compares with a more distal measurement 4.1 mm. The  cervical right ICA is otherwise within normal limits. Left carotid system: The left common carotid artery demonstrates extensive mural calcifications without significant luminal stenosis. Dense circumferential calcification is present just beyond the bifurcation. Minimal luminal diameter is 2.5 mm. This compares with more distal normal segment of 5.3 mm. Vertebral arteries: The left vertebral artery is the dominant vessel. Right vertebral artery is hypoplastic. High-grade stenosis is present at the origin of the dominant left vertebral artery. No additional stenoses are present in either vertebral artery in the neck. Moderate tortuosity is present in the distal V2 segment bilaterally without stenosis. Skeleton: Multilevel degenerative changes are present. Degenerative anterolisthesis present at C4-5 and C5-6. Slight retrolisthesis present at C3-4. No focal lytic or blastic lesions are present. Exaggerated cervical kyphosis noted. Patient is edentulous. Other neck: Soft tissues of the neck otherwise unremarkable. Upper chest: Centrilobular emphysematous changes present at both lung apices. No focal nodule or mass lesion is present. No focal airspace disease is evident. Review of the MIP images confirms the above findings CTA HEAD FINDINGS Anterior circulation: Atherosclerotic calcifications are present within the cavernous internal carotid arteries bilaterally without focal stenosis through the ICA termini. The A1 and M1 segments are normal. The anterior communicating artery is patent. MCA bifurcations are normal. ACA and MCA branch vessels are within normal limits. Posterior circulation: Hypoplastic right vertebral artery terminates at the PICA. Left vertebral artery becomes the basilar artery. PICA origin is visualized normal. Basilar artery is within normal limits. Both posterior cerebral arteries originate from basilar tip. Left posterior communicating artery contributes. The PCA branch vessels are within normal  limits bilaterally. Venous sinuses: Dural sinuses are patent. Right transverse sinus is dominant. Straight sinus deep cerebral veins are intact. Cortical veins are unremarkable. Anatomic variants: None Review of the MIP images confirms the above findings IMPRESSION: 1. No emergent large vessel occlusion. 2. 60% stenosis of the proximal right internal carotid artery. Lumen is narrowed to 1.6 mm. 3. 60% stenosis of  the proximal left internal carotid artery. Lumen is narrowed to 2.5 mm. 4. High-grade stenosis at the origin of the dominant left vertebral artery. 5. Atherosclerotic changes within the cavernous internal carotid arteries bilaterally without significant stenosis. 6. Multilevel spondylosis of the cervical spine. 7. Atherosclerotic changes at the origins the great vessels in throughout the common carotid arteries bilaterally left greater than right without focal stenosis. 8. Aortic Atherosclerosis (ICD10-I70.0) and Emphysema (ICD10-J43.9). Electronically Signed   By: Marin Roberts M.D.   On: 02/15/2021 15:37   CT HEAD WO CONTRAST  Result Date: 02/15/2021 CLINICAL DATA:  87 year old female status post fall at home. Pain, neurologic deficit with right side weakness. EXAM: CT HEAD WITHOUT CONTRAST TECHNIQUE: Contiguous axial images were obtained from the base of the skull through the vertex without intravenous contrast. COMPARISON:  Head CT without contrast 12/17/2020. FINDINGS: Brain: No midline shift, ventriculomegaly, mass effect, evidence of mass lesion, acute intracranial hemorrhage. Stable cerebral volume. Stable Patchy and confluent bilateral white matter hypodensity. Deep gray nuclei, brainstem and cerebellum appear relatively spared. There are punctate new calcified foci along the left middle and superior frontal gyrus (coronal image 40) which were not present in February. No acute cortically based infarct is evident in the region. Vascular: Calcified atherosclerosis at the skull base. No  suspicious intracranial vascular hyperdensity. Skull: No acute osseous abnormality identified. Sinuses/Orbits: Visualized paranasal sinuses and mastoids are stable and well pneumatized. Other: No orbit or scalp soft tissue injury. IMPRESSION: 1. No acute acute traumatic injury identified. 2. Advanced chronic white matter disease with no acute cortically based infarct identified, but there are two punctate calcified foci about the left superior frontal lobe which are new since February and suspicious for thromboembolic plaque in the setting of new right side weakness. Noncontrast Brain MRI would be valuable if feasible. Electronically Signed   By: Odessa Fleming M.D.   On: 02/15/2021 08:15   CT ANGIO NECK W OR WO CONTRAST  Result Date: 02/15/2021 CLINICAL DATA:  Neuro deficit, acute, stroke. Acute visual changes. Changes to NIH score. EXAM: CT ANGIOGRAPHY HEAD AND NECK TECHNIQUE: Multidetector CT imaging of the head and neck was performed using the standard protocol during bolus administration of intravenous contrast. Multiplanar CT image reconstructions and MIPs were obtained to evaluate the vascular anatomy. Carotid stenosis measurements (when applicable) are obtained utilizing NASCET criteria, using the distal internal carotid diameter as the denominator. CONTRAST:  75mL OMNIPAQUE IOHEXOL 350 MG/ML SOLN COMPARISON:  CT head without contrast 02/15/2021. Bilateral carotid Doppler study 02/15/2021 FINDINGS: CTA NECK FINDINGS Aortic arch: Atherosclerotic calcifications are present within the aortic arch at the origins the great vessels no significant stenosis is present at the arch. No aneurysm is present. Right carotid system: Dense calcifications are present in the proximal right common carotid artery without significant stenosis at the bifurcation. Additional mural calcifications are present without stenosis. Dense atherosclerotic calcifications are present at carotid bifurcation. Lumen of the proximal right ICA is  narrowed to 1.6 mm. This compares with a more distal measurement 4.1 mm. The cervical right ICA is otherwise within normal limits. Left carotid system: The left common carotid artery demonstrates extensive mural calcifications without significant luminal stenosis. Dense circumferential calcification is present just beyond the bifurcation. Minimal luminal diameter is 2.5 mm. This compares with more distal normal segment of 5.3 mm. Vertebral arteries: The left vertebral artery is the dominant vessel. Right vertebral artery is hypoplastic. High-grade stenosis is present at the origin of the dominant left vertebral artery. No additional stenoses  are present in either vertebral artery in the neck. Moderate tortuosity is present in the distal V2 segment bilaterally without stenosis. Skeleton: Multilevel degenerative changes are present. Degenerative anterolisthesis present at C4-5 and C5-6. Slight retrolisthesis present at C3-4. No focal lytic or blastic lesions are present. Exaggerated cervical kyphosis noted. Patient is edentulous. Other neck: Soft tissues of the neck otherwise unremarkable. Upper chest: Centrilobular emphysematous changes present at both lung apices. No focal nodule or mass lesion is present. No focal airspace disease is evident. Review of the MIP images confirms the above findings CTA HEAD FINDINGS Anterior circulation: Atherosclerotic calcifications are present within the cavernous internal carotid arteries bilaterally without focal stenosis through the ICA termini. The A1 and M1 segments are normal. The anterior communicating artery is patent. MCA bifurcations are normal. ACA and MCA branch vessels are within normal limits. Posterior circulation: Hypoplastic right vertebral artery terminates at the PICA. Left vertebral artery becomes the basilar artery. PICA origin is visualized normal. Basilar artery is within normal limits. Both posterior cerebral arteries originate from basilar tip. Left  posterior communicating artery contributes. The PCA branch vessels are within normal limits bilaterally. Venous sinuses: Dural sinuses are patent. Right transverse sinus is dominant. Straight sinus deep cerebral veins are intact. Cortical veins are unremarkable. Anatomic variants: None Review of the MIP images confirms the above findings IMPRESSION: 1. No emergent large vessel occlusion. 2. 60% stenosis of the proximal right internal carotid artery. Lumen is narrowed to 1.6 mm. 3. 60% stenosis of the proximal left internal carotid artery. Lumen is narrowed to 2.5 mm. 4. High-grade stenosis at the origin of the dominant left vertebral artery. 5. Atherosclerotic changes within the cavernous internal carotid arteries bilaterally without significant stenosis. 6. Multilevel spondylosis of the cervical spine. 7. Atherosclerotic changes at the origins the great vessels in throughout the common carotid arteries bilaterally left greater than right without focal stenosis. 8. Aortic Atherosclerosis (ICD10-I70.0) and Emphysema (ICD10-J43.9). Electronically Signed   By: Marin Roberts M.D.   On: 02/15/2021 15:37   MR ANGIO HEAD WO CONTRAST  Result Date: 02/16/2021 CLINICAL DATA:  Acute vision changes.  Changes to NIH score. EXAM: MRI HEAD WITHOUT CONTRAST MRA HEAD WITHOUT CONTRAST TECHNIQUE: Multiplanar, multiecho pulse sequences of the brain and surrounding structures were obtained without intravenous contrast. Angiographic images of the head were obtained using MRA technique without contrast. COMPARISON:  Head CT and CTA 02/15/2021 FINDINGS: MRI HEAD FINDINGS Brain: There are scattered small acute cortical and subcortical infarcts in the left frontal, left parietal, and lateral left occipital lobes (MCA and watershed territories). Confluent T2 hyperintensities in the cerebral white matter bilaterally and in the pons are nonspecific but compatible with severe chronic small vessel ischemic disease. There is moderate  cerebral atrophy. No intracranial hemorrhage, mass, midline shift, or extra-axial fluid collection is identified. Vascular: Major intracranial vascular flow voids are preserved. Skull and upper cervical spine: Unremarkable bone marrow signal. Sinuses/Orbits: Bilateral cataract extraction. Paranasal sinuses and mastoid air cells are clear. Other: None. MRA HEAD FINDINGS The visualized distal left vertebral artery is widely patent and strongly dominant, supplying the basilar. The right vertebral artery is small and ends in PICA. Patent PICA and SCA origins are seen bilaterally. The basilar artery is widely patent. There are left larger than right posterior communicating arteries. Both PCAs are patent with distal branch vessel attenuation and irregularity but no evidence of a significant proximal stenosis. The internal carotid arteries are patent from skull base to carotid termini without evidence of a significant  stenosis. ACAs and MCAs are patent without evidence of a significant A1 or M1 stenosis. Segmental regions of signal loss involving MCA branch vessels bilaterally are favored to be artifactual given normal appearance on yesterday's CTA. No aneurysm is identified. IMPRESSION: 1. Scattered small acute infarcts in the left cerebral hemisphere suggestive of unilateral emboli. 2. Severe chronic small vessel ischemic disease. 3. No major intracranial arterial occlusion or significant proximal stenosis. Electronically Signed   By: Sebastian Ache M.D.   On: 02/16/2021 14:10   MR BRAIN WO CONTRAST  Result Date: 02/16/2021 CLINICAL DATA:  Acute vision changes.  Changes to NIH score. EXAM: MRI HEAD WITHOUT CONTRAST MRA HEAD WITHOUT CONTRAST TECHNIQUE: Multiplanar, multiecho pulse sequences of the brain and surrounding structures were obtained without intravenous contrast. Angiographic images of the head were obtained using MRA technique without contrast. COMPARISON:  Head CT and CTA 02/15/2021 FINDINGS: MRI HEAD  FINDINGS Brain: There are scattered small acute cortical and subcortical infarcts in the left frontal, left parietal, and lateral left occipital lobes (MCA and watershed territories). Confluent T2 hyperintensities in the cerebral white matter bilaterally and in the pons are nonspecific but compatible with severe chronic small vessel ischemic disease. There is moderate cerebral atrophy. No intracranial hemorrhage, mass, midline shift, or extra-axial fluid collection is identified. Vascular: Major intracranial vascular flow voids are preserved. Skull and upper cervical spine: Unremarkable bone marrow signal. Sinuses/Orbits: Bilateral cataract extraction. Paranasal sinuses and mastoid air cells are clear. Other: None. MRA HEAD FINDINGS The visualized distal left vertebral artery is widely patent and strongly dominant, supplying the basilar. The right vertebral artery is small and ends in PICA. Patent PICA and SCA origins are seen bilaterally. The basilar artery is widely patent. There are left larger than right posterior communicating arteries. Both PCAs are patent with distal branch vessel attenuation and irregularity but no evidence of a significant proximal stenosis. The internal carotid arteries are patent from skull base to carotid termini without evidence of a significant stenosis. ACAs and MCAs are patent without evidence of a significant A1 or M1 stenosis. Segmental regions of signal loss involving MCA branch vessels bilaterally are favored to be artifactual given normal appearance on yesterday's CTA. No aneurysm is identified. IMPRESSION: 1. Scattered small acute infarcts in the left cerebral hemisphere suggestive of unilateral emboli. 2. Severe chronic small vessel ischemic disease. 3. No major intracranial arterial occlusion or significant proximal stenosis. Electronically Signed   By: Sebastian Ache M.D.   On: 02/16/2021 14:10   US Carotid Bilateral (at [facility] and [facility] only)  Result Date: 02/15/2021 CLINICAL  DATA:  TIA.  History of hypertension.  Former smoker. EXAM: BILATERAL CAROTID DUPLEX ULTRASOUND TECHNIQUE: Wallace Cullens scale imaging, color Doppler and duplex ultrasound were performed of bilateral carotid and vertebral arteries in the neck. COMPARISON:  None. FINDINGS: Criteria: Quantification of carotid stenosis is based on velocity parameters that correlate the residual internal carotid diameter with NASCET-based stenosis levels, using the diameter of the distal internal carotid lumen as the denominator for stenosis measurement. The following velocity measurements were obtained: RIGHT ICA: 247/36 cm/sec CCA: 64/14 cm/sec SYSTOLIC ICA/CCA RATIO:  3.9 ECA: 60 cm/sec LEFT ICA: 188/46 cm/sec CCA: /125/24 cm/sec SYSTOLIC ICA/CCA RATIO:  1.2 ECA: 159 cm/sec RIGHT CAROTID ARTERY: There is a moderate to large amount of eccentric echogenic plaque scattered throughout the right common carotid artery (representative images 3, 7 and 11). There is a large amount of eccentric echogenic plaque within the right carotid bulb (images 14 and 16, extending to  involve the origin and proximal aspects of the right internal carotid artery (image 23, which results in elevated peak systolic velocities within the proximal and mid aspects of the right internal carotid artery. Greatest acquired peak systolic velocity with proximal right ICA measures 247 centimeters/second (image 25). RIGHT VERTEBRAL ARTERY:  Antegrade Flow LEFT CAROTID ARTERY: There is a large amount of eccentric echogenic plaque throughout the left common carotid artery (images 35, 36, 40 and 44). There is a large amount of eccentric echogenic plaque within the left carotid bulb (image 49). There is a large amount of eccentric echogenic densely shadowing plaque involving the origin and proximal aspects of the left internal carotid artery (image 58), which results in elevated peak systolic velocities within the proximal and mid aspects of the left internal carotid artery. Greatest  acquired peak systolic velocity within mid aspect the left ICA measures 188 centimeters/second (image 61). LEFT VERTEBRAL ARTERY:  Antegrade flow IMPRESSION: Large amount of bilateral atherosclerotic plaque results in elevated peak systolic velocities within the bilateral internal carotid arteries compatible with the greater than 70% luminal narrowing within the right internal carotid artery and the higher end of the 50-69% luminal narrowing within the left internal carotid artery. Further evaluation with CTA could performed as clinically indicated. Electronically Signed   By: Simonne Come M.D.   On: 02/15/2021 12:46   DG Chest Port 1 View  Result Date: 02/15/2021 CLINICAL DATA:  87 year old female status post fall at home. Pain, neurologic deficit with right side weakness. EXAM: PORTABLE CHEST 1 VIEW COMPARISON:  CTA chest 12/17/2020 and earlier. FINDINGS: Portable AP upright view at 0732 hours. Lung volumes and mediastinal contours are stable and within normal limits. Calcified aortic atherosclerosis. Visualized tracheal air column is within normal limits. Allowing for portable technique the lungs are clear. No pneumothorax. Stable visualized osseous structures. Right upper extremity calcified atherosclerosis. Negative visible bowel gas. IMPRESSION: No acute cardiopulmonary abnormality or acute traumatic injury identified. Electronically Signed   By: Odessa Fleming M.D.   On: 02/15/2021 08:16   DG Humerus Left  Result Date: 02/15/2021 CLINICAL DATA:  87 year old female status post fall at home. Pain, neurologic deficit with right side weakness. EXAM: LEFT HUMERUS - 2+ VIEW COMPARISON:  Chest CTA 12/17/2020. FINDINGS: Previous left humerus ORIF with hardware to the distal 3rd humerus shaft, stable from February. Healed humeral neck region fracture. Underlying osteopenia. Alignment appears maintained at the left shoulder and elbow. No acute osseous abnormality identified. Negative visible left chest. IMPRESSION:  Previous left humerus ORIF. No acute osseous abnormality identified. Electronically Signed   By: Odessa Fleming M.D.   On: 02/15/2021 08:18   DG Humerus Right  Result Date: 02/15/2021 CLINICAL DATA:  87 year old female status post fall at home. Pain, neurologic deficit with right side weakness. EXAM: RIGHT HUMERUS - 2+ VIEW COMPARISON:  Right wrist series 12/13/2016. FINDINGS: Calcified peripheral vascular disease. Bone mineralization is within normal limits for age. Alignment appears maintained about the right shoulder and elbow. No right humerus fracture identified. Negative visible right chest. IMPRESSION: 1. No acute fracture or dislocation identified about the right humerus. 2. Calcified peripheral vascular disease. Electronically Signed   By: Odessa Fleming M.D.   On: 02/15/2021 08:17   ECHOCARDIOGRAM COMPLETE BUBBLE STUDY  Result Date: 02/16/2021    ECHOCARDIOGRAM REPORT   Patient Name:   AZARIYAH LUHRS Date of Exam: 02/16/2021 Medical Rec #:  [id number]       Height:       61.0 in Accession #:  [id number]      Weight:       93.5 lb Date of Birth:  [birth year]       BSA:          1.367 m Patient Age:    88 years        BP:           167/85 mmHg Patient Gender: F               HR:           105 bpm. [facility] Procedure: 2D Echo and Saline Contrast Bubble Study Indications:    TIA (transient ischemic attack) 435.9 / G45.9  History:        Patient has prior history of Echocardiogram examinations, most                 recent 06/27/2018. Stroke and COPD; Risk Factors:Former Smoker                 and Hypertension. History of rheumatoid arthritis.  Sonographer:    Jeryl Columbia RDCS (AE) Referring Phys: [id number] Shaterica Mcclatchy L Noha Karasik IMPRESSIONS  1. Left ventricular ejection fraction, by estimation, is 60 to 65%. The left ventricle has normal function. The left ventricle has no regional wall motion abnormalities. There is mild left ventricular hypertrophy. Left ventricular diastolic parameters are consistent with  Grade I diastolic dysfunction (impaired relaxation).  2. Right ventricular systolic function is normal. The right ventricular size is normal.  3. Technically difficult visualization during bubble study, no clear evidence of shunt.  4. The mitral valve is normal in structure. No evidence of mitral valve regurgitation. No evidence of mitral stenosis.  5. The aortic valve is tricuspid. Aortic valve regurgitation is not visualized. No aortic stenosis is present.  6. The inferior vena cava is normal in size with greater than 50% respiratory variability, suggesting right atrial pressure of 3 mmHg. FINDINGS  Left Ventricle: Left ventricular ejection fraction, by estimation, is 60 to 65%. The left ventricle has normal function. The left ventricle has no regional wall motion abnormalities. The left ventricular internal cavity size was normal in size. There is  mild left ventricular hypertrophy. Left ventricular diastolic parameters are consistent with Grade I diastolic dysfunction (impaired relaxation). Normal left ventricular filling pressure. Right Ventricle: The right ventricular size is normal. No increase in right ventricular wall thickness. Right ventricular systolic function is normal. Left Atrium: Left atrial size was normal in size. Right Atrium: Right atrial size was normal in size. Pericardium: There is no evidence of pericardial effusion. Mitral Valve: The mitral valve is normal in structure. No evidence of mitral valve regurgitation. No evidence of mitral valve stenosis. Tricuspid Valve: The tricuspid valve is normal in structure. Tricuspid valve regurgitation is not demonstrated. No evidence of tricuspid stenosis. Aortic Valve: The aortic valve is tricuspid. Aortic valve regurgitation is not visualized. No aortic stenosis is present. Pulmonic Valve: The pulmonic valve was not well visualized. Pulmonic valve regurgitation is not visualized. No evidence of pulmonic stenosis. Aorta: The aortic root is normal in  size and structure. Pulmonary Artery: Indeterminant PASP, inadequate TR jet. Venous: The inferior vena cava is normal in size with greater than 50% respiratory variability, suggesting right atrial pressure of 3 mmHg. IAS/Shunts: The interatrial septum was not well visualized. Agitated saline contrast was given intravenously to evaluate for intracardiac shunting.  LEFT VENTRICLE PLAX 2D LVIDd:         2.46 cm  Diastology  LVIDs:         1.70 cm  LV e' medial:    6.31 cm/s LV PW:         1.10 cm  LV E/e' medial:  13.0 LV IVS:        1.03 cm  LV e' lateral:   9.36 cm/s LVOT diam:     1.70 cm  LV E/e' lateral: 8.8 LV SV:         52 LV SV Index:   38 LVOT Area:     2.27 cm  RIGHT VENTRICLE RV S prime:     14.80 cm/s TAPSE (M-mode): 1.8 cm LEFT ATRIUM             Index       RIGHT ATRIUM          Index LA diam:        3.00 cm 2.19 cm/m  RA Area:     6.04 cm LA Vol (A2C):   20.8 ml 15.22 ml/m RA Volume:   9.84 ml  7.20 ml/m LA Vol (A4C):   16.5 ml 12.07 ml/m LA Biplane Vol: 19.3 ml 14.12 ml/m  AORTIC VALVE LVOT Vmax:   119.24 cm/s LVOT Vmean:  73.835 cm/s LVOT VTI:    0.229 m  AORTA Ao Root diam: 2.60 cm MITRAL VALVE MV Area (PHT): 2.71 cm    SHUNTS MV Decel Time: 280 msec    Systemic VTI:  0.23 m MV E velocity: 82.00 cm/s  Systemic Diam: 1.70 cm MV A velocity: 92.40 cm/s MV E/A ratio:  0.89 Dina Rich MD Electronically signed by Dina Rich MD Signature Date/Time: 02/16/2021/10:47:59 AM    Final       Subjective: Pt reports that she is feeling better gaining strength in right side everyday, wants to go home.   Discharge Exam: Vitals:   02/17/21 0524 02/17/21 0739  BP: (!) 141/66   Pulse: 86   Resp: (!) 21   Temp: 98.6 F (37 C)   SpO2: 95% 96%   Vitals:   02/16/21 1938 02/16/21 2131 02/17/21 0524 02/17/21 0739  BP:  (!) 168/63 (!) 141/66   Pulse:  98 86   Resp:   (!) 21   Temp:  99.2 F (37.3 C) 98.6 F (37 C)   TempSrc:  Oral Oral   SpO2: 94% 92% 95% 96%  Weight:      Height:        General: Pt is alert, awake, not in acute distress Cardiovascular: normal S1/S2 +, no rubs, no gallops Respiratory: CTA bilaterally, no wheezing, no rhonchi Abdominal: Soft, NT, ND, bowel sounds + Extremities: no edema, no cyanosis Neuro: 4/5 RUE/RLE, 5/5 LUE/LLL.    The results of significant diagnostics from this hospitalization (including imaging, microbiology, ancillary and laboratory) are listed below for reference.     Microbiology: Recent Results (from the past 240 hour(s))  Resp Panel by RT-PCR (Flu A&B, Covid) Nasopharyngeal Swab     Status: None   Collection Time: 02/15/21  8:04 AM   Specimen: Nasopharyngeal Swab; Nasopharyngeal(NP) swabs in vial transport medium  Result Value Ref Range Status   SARS Coronavirus 2 by RT PCR NEGATIVE NEGATIVE Final    Comment: (NOTE) SARS-CoV-2 target nucleic acids are NOT DETECTED.  The SARS-CoV-2 RNA is generally detectable in upper respiratory specimens during the acute phase of infection. The lowest concentration of SARS-CoV-2 viral copies this assay can detect is 138 copies/mL. A negative result does not preclude SARS-Cov-2 infection  and should not be used as the sole basis for treatment or other patient management decisions. A negative result may occur with  improper specimen collection/handling, submission of specimen other than nasopharyngeal swab, presence of viral mutation(s) within the areas targeted by this assay, and inadequate number of viral copies(<138 copies/mL). A negative result must be combined with clinical observations, patient history, and epidemiological information. The expected result is Negative.  Fact Sheet for Patients:  BloggerCourse.com  Fact Sheet for Healthcare Providers:  SeriousBroker.it  This test is no t yet approved or cleared by the [place] FDA and  has been authorized for detection and/or diagnosis of SARS-CoV-2 by FDA under an  Emergency Use Authorization (EUA). This EUA will remain  in effect (meaning this test can be used) for the duration of the COVID-19 declaration under Section 564(b)(1) of the Act, 21 U.S.C.section 360bbb-3(b)(1), unless the authorization is terminated  or revoked sooner.       Influenza A by PCR NEGATIVE NEGATIVE Final   Influenza B by PCR NEGATIVE NEGATIVE Final    Comment: (NOTE) The Xpert Xpress SARS-CoV-2/FLU/RSV plus assay is intended as an aid in the diagnosis of influenza from Nasopharyngeal swab specimens and should not be used as a sole basis for treatment. Nasal washings and aspirates are unacceptable for Xpert Xpress SARS-CoV-2/FLU/RSV testing.  Fact Sheet for Patients: BloggerCourse.com  Fact Sheet for Healthcare Providers: SeriousBroker.it  This test is not yet approved or cleared by the [place] FDA and has been authorized for detection and/or diagnosis of SARS-CoV-2 by FDA under an Emergency Use Authorization (EUA). This EUA will remain in effect (meaning this test can be used) for the duration of the COVID-19 declaration under Section 564(b)(1) of the Act, 21 U.S.C. section 360bbb-3(b)(1), unless the authorization is terminated or revoked.  Performed at [facility], [address] [place], [place] [address]      Labs: BNP (last 3 results) No results for input(s): BNP in the last 8760 hours. Basic Metabolic Panel: Recent Labs  Lab 02/15/21 0721 02/15/21 0725 02/16/21 0633  NA 136 138 136  K 3.9 4.0 3.7  CL 104 106 107  CO2 22  --  20*  GLUCOSE 109* 106* 106*  BUN 14 13 15   CREATININE 0.78 0.80 0.71  CALCIUM 9.2  --  8.5*   Liver Function Tests: Recent Labs  Lab 02/15/21 0721 02/16/21 0633  AST 18 16  ALT 13 12  ALKPHOS 68 55  BILITOT 0.8 0.6  PROT 6.9 5.6*  ALBUMIN 3.7 2.9*   No results for input(s): LIPASE, AMYLASE in the last 168 hours. No results for input(s): AMMONIA in the last  168 hours. CBC: Recent Labs  Lab 02/15/21 0721 02/15/21 0725 02/16/21 0633  WBC 6.2  --  5.2  NEUTROABS 3.8  --   --   HGB 11.3* 12.2 9.7*  HCT 36.4 36.0 31.1*  MCV 91.9  --  91.5  PLT 230  --  199   Cardiac Enzymes: No results for input(s): CKTOTAL, CKMB, CKMBINDEX, TROPONINI in the last 168 hours. BNP: Invalid input(s): POCBNP CBG: Recent Labs  Lab 02/15/21 0725  GLUCAP 93   D-Dimer No results for input(s): DDIMER in the last 72 hours. Hgb A1c Recent Labs    02/15/21 0721  HGBA1C 4.9   Lipid Profile Recent Labs    02/15/21 0721  CHOL 173  HDL 60  LDLCALC 94  TRIG 97  CHOLHDL 2.9   Thyroid function studies No results for input(s):  TSH, T4TOTAL, T3FREE, THYROIDAB in the last 72 hours.  Invalid input(s): FREET3 Anemia work up No results for input(s): VITAMINB12, FOLATE, FERRITIN, TIBC, IRON, RETICCTPCT in the last 72 hours. Urinalysis    Component Value Date/Time   COLORURINE YELLOW 12/17/2020 2047   APPEARANCEUR CLEAR 12/17/2020 2047   LABSPEC >1.046 (H) 12/17/2020 2047   PHURINE 6.0 12/17/2020 2047   GLUCOSEU NEGATIVE 12/17/2020 2047   HGBUR NEGATIVE 12/17/2020 2047   BILIRUBINUR NEGATIVE 12/17/2020 2047   KETONESUR 5 (A) 12/17/2020 2047   PROTEINUR NEGATIVE 12/17/2020 2047   NITRITE NEGATIVE 12/17/2020 2047   LEUKOCYTESUR TRACE (A) 12/17/2020 2047   Sepsis Labs Invalid input(s): PROCALCITONIN,  WBC,  LACTICIDVEN Microbiology Recent Results (from the past 240 hour(s))  Resp Panel by RT-PCR (Flu A&B, Covid) Nasopharyngeal Swab     Status: None   Collection Time: 02/15/21  8:04 AM   Specimen: Nasopharyngeal Swab; Nasopharyngeal(NP) swabs in vial transport medium  Result Value Ref Range Status   SARS Coronavirus 2 by RT PCR NEGATIVE NEGATIVE Final    Comment: (NOTE) SARS-CoV-2 target nucleic acids are NOT DETECTED.  The SARS-CoV-2 RNA is generally detectable in upper respiratory specimens during the acute phase of infection. The  lowest concentration of SARS-CoV-2 viral copies this assay can detect is 138 copies/mL. A negative result does not preclude SARS-Cov-2 infection and should not be used as the sole basis for treatment or other patient management decisions. A negative result may occur with  improper specimen collection/handling, submission of specimen other than nasopharyngeal swab, presence of viral mutation(s) within the areas targeted by this assay, and inadequate number of viral copies(<138 copies/mL). A negative result must be combined with clinical observations, patient history, and epidemiological information. The expected result is Negative.  Fact Sheet for Patients:  BloggerCourse.com  Fact Sheet for Healthcare Providers:  SeriousBroker.it  This test is no t yet approved or cleared by the [place] FDA and  has been authorized for detection and/or diagnosis of SARS-CoV-2 by FDA under an Emergency Use Authorization (EUA). This EUA will remain  in effect (meaning this test can be used) for the duration of the COVID-19 declaration under Section 564(b)(1) of the Act, 21 U.S.C.section 360bbb-3(b)(1), unless the authorization is terminated  or revoked sooner.       Influenza A by PCR NEGATIVE NEGATIVE Final   Influenza B by PCR NEGATIVE NEGATIVE Final    Comment: (NOTE) The Xpert Xpress SARS-CoV-2/FLU/RSV plus assay is intended as an aid in the diagnosis of influenza from Nasopharyngeal swab specimens and should not be used as a sole basis for treatment. Nasal washings and aspirates are unacceptable for Xpert Xpress SARS-CoV-2/FLU/RSV testing.  Fact Sheet for Patients: BloggerCourse.com  Fact Sheet for Healthcare Providers: SeriousBroker.it  This test is not yet approved or cleared by the [place] FDA and has been authorized for detection and/or diagnosis of SARS-CoV-2 by FDA under  an Emergency Use Authorization (EUA). This EUA will remain in effect (meaning this test can be used) for the duration of the COVID-19 declaration under Section 564(b)(1) of the Act, 21 U.S.C. section 360bbb-3(b)(1), unless the authorization is terminated or revoked.  Performed at [facility], [address], [place], [place] [address]    Time coordinating discharge: 40 mins   SIGNED:  Standley Dakins, MD  [facility] 02/17/2021, 12:00 PM How to contact the [facility] Attending or Consulting provider 7A - 7P or covering provider during after hours 7P -7A, for this patient?  1. Check the care team  in [facility] and look for a) attending/consulting [facility] provider listed and b) the [facility] team listed 2. Log into [email or web address] and use [place]'s universal password to access. If you do not have the password, please contact the hospital operator. 3. Locate the [facility] provider you are looking for under [facility] and page to a number that you can be directly reached. 4. If you still have difficulty reaching the provider, please page the [facility] (Director on Call) for the Hospitalists listed on amion for assistance.

## 2021-02-17 NOTE — Progress Notes (Signed)
Discharge instructions and Stroke Education booklet reviewed with patient and patient's daughter. Both verbalized understanding of instructions. Patient discharged home with daughter in stable condition.

## 2021-02-17 NOTE — Plan of Care (Signed)
Reviewed stroke education with patient and patients children/caregivers. Both verbalized understanding of education.

## 2021-02-17 NOTE — TOC Transition Note (Addendum)
Transition of Care [facility]) - CM/SW Discharge Note   Patient Details  Name: [name] [name] MRN: [id number] Date of Birth: Apr 04, 1932  Transition of Care [facility]) CM/SW Contact:  Karn Cassis, LCSW Phone Number: 02/17/2021, 1:07 PM   Clinical Narrative:  Pt d/c today and will return home with home health. LCSW notified Kandee Keen with [facility] that orders are in for PT, OT, and SLP. Pt's daughter, Arline Asp will provide transportation home. She states she will notify [facility] of return home.  [facility] called and requested discharge summary and progress notes to resume aid services. Arline Asp gives permission for TOC to send.      Final next level of care: Home w Home Health Services Barriers to Discharge: Barriers Resolved   Patient Goals and CMS Choice Patient states their goals for this hospitalization and ongoing recovery are:: return home   Choice offered to / list presented to : Adult Children  Discharge Placement                       Discharge Plan and Services In-house Referral: Clinical Social Work   Post Acute Care Choice: Home Health          DME Arranged: Wheelchair manual DME Agency: [employer] Date DME Agency Contacted: 02/16/21 Time DME Agency Contacted: 1506 Representative spoke with at DME Agency: [facility] Arranged: [facility] [place] HH Agency: [facility] Date [facility] Agency Contacted: 02/16/21 Time HH Agency Contacted: 1506 Representative spoke with at [facility] Agency: Kandee Keen  Social Determinants of Health (SDOH) Interventions     Readmission Risk Interventions No flowsheet data found.

## 2021-02-17 NOTE — Discharge Instructions (Signed)
Please take plavix and aspirin together daily for 3 months then stop plavix but continue taking the aspirin daily.     IMPORTANT INFORMATION: PAY CLOSE ATTENTION   PHYSICIAN DISCHARGE INSTRUCTIONS  Follow with Primary care provider  [name] [name]., PA-C  and other consultants as instructed by your Hospitalist Physician  SEEK MEDICAL CARE OR RETURN TO EMERGENCY ROOM IF SYMPTOMS COME BACK, WORSEN OR NEW PROBLEM DEVELOPS   Please note: You were cared for by a hospitalist during your hospital stay. Every effort will be made to forward records to your primary care provider.  You can request that your primary care provider send for your hospital records if they have not received them.  Once you are discharged, your primary care physician will handle any further medical issues. Please note that NO REFILLS for any discharge medications will be authorized once you are discharged, as it is imperative that you return to your primary care physician (or establish a relationship with a primary care physician if you do not have one) for your post hospital discharge needs so that they can reassess your need for medications and monitor your lab values.  Please get a complete blood count and chemistry panel checked by your Primary MD at your next visit, and again as instructed by your Primary MD.  Get Medicines reviewed and adjusted: Please take all your medications with you for your next visit with your Primary MD  Laboratory/radiological data: Please request your Primary MD to go over all hospital tests and procedure/radiological results at the follow up, please ask your primary care provider to get all Hospital records sent to his/her office.  In some cases, they will be blood work, cultures and biopsy results pending at the time of your discharge. Please request that your primary care provider follow up on these results.  If you are diabetic, please bring your blood sugar readings with you to your  follow up appointment with primary care.    Please call and make your follow up appointments as soon as possible.    Also Note the following: If you experience worsening of your admission symptoms, develop shortness of breath, life threatening emergency, suicidal or homicidal thoughts you must seek medical attention immediately by calling 911 or calling your MD immediately  if symptoms less severe.  You must read complete instructions/literature along with all the possible adverse reactions/side effects for all the Medicines you take and that have been prescribed to you. Take any new Medicines after you have completely understood and accpet all the possible adverse reactions/side effects.   Do not drive when taking Pain medications or sleeping medications (Benzodiazepines)  Do not take more than prescribed Pain, Sleep and Anxiety Medications. It is not advisable to combine anxiety,sleep and pain medications without talking with your primary care practitioner  Special Instructions: If you have smoked or chewed Tobacco  in the last 2 yrs please stop smoking, stop any regular Alcohol  and or any Recreational drug use.  Wear Seat belts while driving.  Do not drive if taking any narcotic, mind altering or controlled substances or recreational drugs or alcohol.

## 2021-02-17 NOTE — Progress Notes (Signed)
Physical Therapy Treatment Patient Details Name: MACKYNZIE WOOLFORD MRN: [id number] DOB: [birth year] Today's Date: 02/17/2021    History of Present Illness NEHEMIAH MONTEE is a 87 y.o. female with c/o R arm and shoulder pain. She reported that she fell after waking up early on 02/15/21 to get out of bed.  She reported that for some reason her right side was not working properly. She felt like her right arms and legs were dead. She had decreased strength mobility and sensation in her right side. She fell immediately upon getting up. She injured her arms and knees. No head injury. 02/15/21: MRI positive for L cerebral infarcts. PMH: RA, COPD, HTN, falls, anxiety disorder, R THA 12/2018 and bil TKA.    PT Comments    Pt with significant R lateral lean in standing and sitting, requires cues for weight-shift L often. Pt ambulates with narrow BOS, trunk flexed and R sidebent, maintains RLE slightly internally rotated and decreased weight-shift to L. Therapist cued pt for wider BOS, encouraging L weight-shift and assist to maneuver RW with fatigue due to getting body outside of frame. Pt tolerates standing reaching with R and L hand to anterior-lateral targets at L side, encouraging L weight-shift, attention to L, and using mirror for mind-body connection with visual feedback. Educated pt and pt's daughter on cues to promote L weight-shift with mobility, ambulation, DME and exercises; all questions answered and education completed. Pt's daughter states pt will be returning home with 24 hr assist and HHPT.   Follow Up Recommendations  SNF;Supervision for mobility/OOB;Supervision/Assistance - 24 hour     Equipment Recommendations       Recommendations for Other Services       Precautions / Restrictions Precautions Precautions: Fall Restrictions Weight Bearing Restrictions: No    Mobility  Bed Mobility  General bed mobility comments: in chair upon arrival    Transfers Overall transfer level: Needs  assistance Equipment used: Rolling walker (2 wheeled) Transfers: Sit to/from Stand Sit to Stand: Min guard    General transfer comment: min G to power up to stand, cues for hand placement on seated surface to rise  Ambulation/Gait Ambulation/Gait assistance: Mod assist Gait Distance (Feet): 10 Feet (5' x2) Assistive device: Rolling walker (2 wheeled) Gait Pattern/deviations: Decreased step length - right;Decreased step length - left;Decreased stride length;Shuffle;Trunk flexed;Narrow base of support Gait velocity: decreased   General Gait Details: significant R lateral lean in standing, RLE internal rotation, cues for upright posture, widening BOS, L weight-shift to clear R foot and RW management, pt limited due to fatigue   Stairs             Wheelchair Mobility    Modified Rankin (Stroke Patients Only)       Balance Overall balance assessment: Needs assistance  Postural control: Right lateral lean Standing balance support: During functional activity;Bilateral upper extremity supported Standing balance-Leahy Scale: Poor Standing balance comment: R lateral lean in standing, reliant on UE support     Cognition Arousal/Alertness: Awake/alert Behavior During Therapy: WFL for tasks assessed/performed Overall Cognitive Status: Within Functional Limits for tasks assessed     Exercises Other Exercises Other Exercises: standing at bathroom sink in front of mirror for visual feedback, reaching with R and L hands L anterior-lateral to encourage L attention, weight-shift L, 5 sec isometric hold at furthest position, x8 minutes    General Comments        Pertinent Vitals/Pain Pain Assessment: No/denies pain    Home Living  Prior Function            PT Goals (current goals can now be found in the care plan section) Acute Rehab PT Goals Patient Stated Goal: Return home with family to assist. PT Goal Formulation: With patient/family Time  For Goal Achievement: 03/02/21 Potential to Achieve Goals: Good Progress towards PT goals: Progressing toward goals    Frequency    Min 3X/week      PT Plan Current plan remains appropriate    Co-evaluation              AM-PAC PT "6 Clicks" Mobility   Outcome Measure  Help needed turning from your back to your side while in a flat bed without using bedrails?: A Lot Help needed moving from lying on your back to sitting on the side of a flat bed without using bedrails?: A Lot Help needed moving to and from a bed to a chair (including a wheelchair)?: A Lot Help needed standing up from a chair using your arms (e.g., wheelchair or bedside chair)?: A Little Help needed to walk in hospital room?: A Lot Help needed climbing 3-5 steps with a railing? : Total 6 Click Score: 12    End of Session Equipment Utilized During Treatment: Gait belt Activity Tolerance: Patient tolerated treatment well;Patient limited by fatigue Patient left: in chair;with call bell/phone within reach;with chair alarm set;with family/visitor present Nurse Communication: Mobility status PT Visit Diagnosis: Unsteadiness on feet (R26.81);Other abnormalities of gait and mobility (R26.89);Muscle weakness (generalized) (M62.81)     Time: 3716-9678 PT Time Calculation (min) (ACUTE ONLY): 24 min  Charges:  $Gait Training: 8-22 mins $Neuromuscular Re-education: 8-22 mins                     Tori Windsor Goeken PT, DPT 02/17/21, 1:42 PM

## 2021-03-03 ENCOUNTER — Other Ambulatory Visit: Payer: Self-pay | Admitting: Nurse Practitioner

## 2021-03-03 ENCOUNTER — Other Ambulatory Visit: Payer: Self-pay

## 2021-03-03 DIAGNOSIS — R531 Weakness: Secondary | ICD-10-CM

## 2021-03-03 DIAGNOSIS — Z515 Encounter for palliative care: Secondary | ICD-10-CM

## 2021-03-03 NOTE — Progress Notes (Signed)
Therapist, nutritional Palliative Care Consult Note Telephone: [phone]  Fax: [phone]    Date of encounter: 03/03/21 PATIENT NAME: [name] [name] [address] [place] [address]   [phone] (home)  DOB: 1931-12-28 MRN: [id number] PRIMARY CARE PROVIDER:    Gala Lewandowsky,  [address] [place] [address] [phone]  REFERRING PROVIDER:   Richmond Campbell., PA-C [address],  [place] [address] [phone]  RESPONSIBLE PARTY:    Contact Information    Name Relation Home Work Mobile   Morris,Cindy Daughter [phone]  [phone]      I met face to face with patient in home. Daughter [name] [name] present during visit. Palliative Care was asked to follow this patient by consultation request of  Richmond Campbell., PA-C to address advance care planning and complex medical decision making. This is a follow up visit from 02/03/2021.                                   ASSESSMENT AND PLAN / RECOMMENDATIONS:   Advance Care Planning/Goals of Care:  Code Status: DNR Goal of Care: Comfort while preserving function Directives: Signed DNR and MOST form present in home, copy on [place] Epic EMR. Details of MOST include comfort measures, antibiotics if indicated, IV fluids for defined period of time, no feeding tube.  Symptom Management/Plan: Weakness related to CVA: Patient with mild right side weakness. She has round the clock caregiver assistance during the week. Patient currently ambulates with a front wheel walker, uses wheelchair at times. She report [place]  Plan: Continue supportive care, Continue PT/OT. Maintain safety, prevent falls, offer assistance with ADLs while encouraging her to do as much as possible for self. Monitor for sign of decrease function or acute pain, and notify provider promptly. Questions and concerns were addressed. Patient  and family was encouraged to call with questions and/or concerns.     Follow up Palliative Care Visit: Palliative care will continue to follow for complex medical decision making, advance care planning, and clarification of goals. Return 4 weeks or prn.  PPS: 40%  HOSPICE ELIGIBILITY/DIAGNOSIS: TBD  CHIEF COMPLAIN:  Weakness  HISTORY OF PRESENT ILLNESS:[name] R Dixonis a 87 y.o.year old femalewith multiple medical problems including COPD, Rheumatoid arthritis, HTN.Patient with complain of weakness ongoing and worsened in the context of recent CVA that occurred 3 weeks ago. Condition is improving with ongoing physical and occupational therapy. Of note, patient had a mechanical fall about three weeks ago, was transported to ED due to right side weakness, in the ED she was noted to have had an acute Ischemic stroke, no acute fracture. Patient was hospitalized for 3 days and discharged on Aspirin, Plavix, and Statin to follow up with neurology out patient. Today patient denied any uncontrolled pain, denied chest pain, denied SOB, denied acute bleed. She report feeling well, with fair appetite, voiced no new concerns. She is tolerating Statin without report of myalgia or abdominal pain, 10 systems reviewed and are negative for acute change, except as noted in the HPI.   History obtained from review of EMR, discussion with patient and her family.   I reviewed available labs, medications, imaging, studies and related documents from the EMR.  Records reviewed and summarized above.   Physical Exam: Current and past weights: 92lbs down from 95lbs last  month, Ht 19f 1", BMI 17.4kg/m2 Constitutional: NAD General: chronically ill and frail appearing, cachetic  EYES: anicteric sclera, no discharge  ENMT: mild hard of hearing, oral mucous membranes moist, dentures CV: +1 edema to bilateral ankles Pulmonary: LCTA, no increased work of breathing, no cough, room air Abdomen: no ascites GU: deferred MSK:  sever sarcopenia, moves all extremities, ambulatory Skin: warm and dry, healing wounds noted on right ankle Neuro: generalized weakness,  no cognitive impairment Psych: non-anxious affect, Alert Hem/lymph/immuno: no widespread bruising  Past Medical History:  Diagnosis Date  . Anxiety   . Arthritis    rheumatoid  . COPD (chronic obstructive pulmonary disease) ([place])   . Fall   . Hypertension    Current Outpatient Medications on File Prior to Visit  Medication Sig Dispense Refill  . acetaminophen (TYLENOL) 500 MG tablet Take 2 tablets (1,000 mg total) by mouth every 8 (eight) hours. 30 tablet 0  . albuterol (PROVENTIL HFA;VENTOLIN HFA) 108 (90 BASE) MCG/ACT inhaler Inhale 2 puffs into the lungs every 6 (six) hours as needed for wheezing or shortness of breath.    . ALPRAZolam (XANAX) 0.5 MG tablet Take 0.25-0.5 mg by mouth 2 (two) times daily. Take 0.25 every morning and 0.5 mg every evening    . amLODipine (NORVASC) 5 MG tablet Take 1 tablet (5 mg total) by mouth daily. 30 tablet 0  . aspirin EC 81 MG tablet Take 1 tablet (81 mg total) by mouth daily. Swallow whole. 30 tablet 11  . atorvastatin (LIPITOR) 20 MG tablet Take 1 tablet (20 mg total) by mouth every evening. 30 tablet 2  . budesonide-formoterol (SYMBICORT) 160-4.5 MCG/ACT inhaler Inhale 2 puffs into the lungs 2 (two) times daily.    . cholecalciferol (VITAMIN D) 1000 UNITS tablet Take 1,000 Units by mouth daily.    . clopidogrel (PLAVIX) 75 MG tablet Take 1 tablet (75 mg total) by mouth daily. 30 tablet 2  . feeding supplement, ENSURE ENLIVE, (ENSURE ENLIVE) LIQD Take 237 mLs by mouth 2 (two) times daily between meals. 60 Bottle 0  . fluticasone (FLONASE) 50 MCG/ACT nasal spray Place 2 sprays into both nostrils at bedtime.     . hydroxypropyl methylcellulose / hypromellose (ISOPTO TEARS / GONIOVISC) 2.5 % ophthalmic solution Place 1 drop into both eyes 3 (three) times daily as needed for dry eyes.    Marland Kitchen leflunomide (ARAVA) 20 MG  tablet Take 1 tablet (20 mg total) by mouth daily.    . metoprolol tartrate (LOPRESSOR) 25 MG tablet Take 1 tablet (25 mg total) by mouth 2 (two) times daily. 60 tablet 0  . Multiple Vitamin (MULTIVITAMIN WITH MINERALS) TABS tablet Take 1 tablet by mouth daily.    . pantoprazole (PROTONIX) 40 MG tablet Take 1 tablet (40 mg total) by mouth daily at 6 (six) AM. 30 tablet 2  . ramipril (ALTACE) 10 MG capsule Take 20 mg by mouth daily.    Marland Kitchen senna-docusate (SENOKOT-S) 8.6-50 MG tablet Take 1 tablet by mouth at bedtime as needed for mild constipation. 30 tablet 0   No current facility-administered medications on file prior to visit.   This visit was coded based on medical decision making (MDM)  Thank you for the opportunity to participate in the care of Ms. Truszkowski.  The palliative care team will continue to follow. Please call our office at [phone] if we can be of additional assistance.   Jari Favre, DNP, AGPCNP-BC  COVID-19 PATIENT SCREENING TOOL Asked and negative response unless otherwise noted:  Have you had symptoms of covid, tested positive or been in contact with someone with symptoms/positive test in the past 5-10 days?

## 2021-05-05 ENCOUNTER — Encounter (HOSPITAL_COMMUNITY): Payer: Self-pay

## 2021-05-05 ENCOUNTER — Emergency Department (HOSPITAL_COMMUNITY): Payer: Medicare HMO

## 2021-05-05 ENCOUNTER — Other Ambulatory Visit: Payer: Self-pay

## 2021-05-05 ENCOUNTER — Inpatient Hospital Stay (HOSPITAL_COMMUNITY)
Admission: EM | Admit: 2021-05-05 | Discharge: 2021-05-12 | DRG: 481 | Disposition: A | Discharge status: Skilled Nursing Facility | Payer: Medicare HMO | Attending: Hospitalist | Admitting: Hospitalist

## 2021-05-05 DIAGNOSIS — Z20822 Contact with and (suspected) exposure to covid-19: Secondary | ICD-10-CM | POA: Diagnosis present

## 2021-05-05 DIAGNOSIS — R52 Pain, unspecified: Secondary | ICD-10-CM

## 2021-05-05 DIAGNOSIS — F411 Generalized anxiety disorder: Secondary | ICD-10-CM | POA: Diagnosis present

## 2021-05-05 DIAGNOSIS — S7290XA Unspecified fracture of unspecified femur, initial encounter for closed fracture: Secondary | ICD-10-CM

## 2021-05-05 DIAGNOSIS — I10 Essential (primary) hypertension: Secondary | ICD-10-CM | POA: Diagnosis present

## 2021-05-05 DIAGNOSIS — D509 Iron deficiency anemia, unspecified: Secondary | ICD-10-CM | POA: Diagnosis present

## 2021-05-05 DIAGNOSIS — K219 Gastro-esophageal reflux disease without esophagitis: Secondary | ICD-10-CM

## 2021-05-05 DIAGNOSIS — Y92009 Unspecified place in unspecified non-institutional (private) residence as the place of occurrence of the external cause: Secondary | ICD-10-CM | POA: Diagnosis not present

## 2021-05-05 DIAGNOSIS — W010XXA Fall on same level from slipping, tripping and stumbling without subsequent striking against object, initial encounter: Secondary | ICD-10-CM | POA: Diagnosis present

## 2021-05-05 DIAGNOSIS — Y92018 Other place in single-family (private) house as the place of occurrence of the external cause: Secondary | ICD-10-CM

## 2021-05-05 DIAGNOSIS — M069 Rheumatoid arthritis, unspecified: Secondary | ICD-10-CM | POA: Diagnosis present

## 2021-05-05 DIAGNOSIS — Z7982 Long term (current) use of aspirin: Secondary | ICD-10-CM

## 2021-05-05 DIAGNOSIS — W19XXXA Unspecified fall, initial encounter: Secondary | ICD-10-CM | POA: Diagnosis not present

## 2021-05-05 DIAGNOSIS — E871 Hypo-osmolality and hyponatremia: Secondary | ICD-10-CM

## 2021-05-05 DIAGNOSIS — J9611 Chronic respiratory failure with hypoxia: Secondary | ICD-10-CM | POA: Diagnosis present

## 2021-05-05 DIAGNOSIS — N179 Acute kidney failure, unspecified: Secondary | ICD-10-CM | POA: Diagnosis not present

## 2021-05-05 DIAGNOSIS — Z880 Allergy status to penicillin: Secondary | ICD-10-CM

## 2021-05-05 DIAGNOSIS — Z882 Allergy status to sulfonamides status: Secondary | ICD-10-CM

## 2021-05-05 DIAGNOSIS — Z885 Allergy status to narcotic agent status: Secondary | ICD-10-CM

## 2021-05-05 DIAGNOSIS — Z96652 Presence of left artificial knee joint: Secondary | ICD-10-CM | POA: Diagnosis present

## 2021-05-05 DIAGNOSIS — S72331A Displaced oblique fracture of shaft of right femur, initial encounter for closed fracture: Secondary | ICD-10-CM

## 2021-05-05 DIAGNOSIS — Z66 Do not resuscitate: Secondary | ICD-10-CM | POA: Diagnosis present

## 2021-05-05 DIAGNOSIS — E860 Dehydration: Secondary | ICD-10-CM

## 2021-05-05 DIAGNOSIS — F419 Anxiety disorder, unspecified: Secondary | ICD-10-CM | POA: Diagnosis not present

## 2021-05-05 DIAGNOSIS — E8809 Other disorders of plasma-protein metabolism, not elsewhere classified: Secondary | ICD-10-CM

## 2021-05-05 DIAGNOSIS — D62 Acute posthemorrhagic anemia: Secondary | ICD-10-CM | POA: Diagnosis not present

## 2021-05-05 DIAGNOSIS — Z888 Allergy status to other drugs, medicaments and biological substances status: Secondary | ICD-10-CM

## 2021-05-05 DIAGNOSIS — Z8249 Family history of ischemic heart disease and other diseases of the circulatory system: Secondary | ICD-10-CM

## 2021-05-05 DIAGNOSIS — M79651 Pain in right thigh: Secondary | ICD-10-CM | POA: Diagnosis present

## 2021-05-05 DIAGNOSIS — R7401 Elevation of levels of liver transaminase levels: Secondary | ICD-10-CM | POA: Diagnosis present

## 2021-05-05 DIAGNOSIS — Z87891 Personal history of nicotine dependence: Secondary | ICD-10-CM

## 2021-05-05 DIAGNOSIS — Z9981 Dependence on supplemental oxygen: Secondary | ICD-10-CM

## 2021-05-05 DIAGNOSIS — Z79899 Other long term (current) drug therapy: Secondary | ICD-10-CM

## 2021-05-05 DIAGNOSIS — Z8739 Personal history of other diseases of the musculoskeletal system and connective tissue: Secondary | ICD-10-CM | POA: Diagnosis not present

## 2021-05-05 DIAGNOSIS — Z886 Allergy status to analgesic agent status: Secondary | ICD-10-CM

## 2021-05-05 DIAGNOSIS — S7291XA Unspecified fracture of right femur, initial encounter for closed fracture: Secondary | ICD-10-CM | POA: Diagnosis present

## 2021-05-05 DIAGNOSIS — Z8673 Personal history of transient ischemic attack (TIA), and cerebral infarction without residual deficits: Secondary | ICD-10-CM

## 2021-05-05 DIAGNOSIS — Z419 Encounter for procedure for purposes other than remedying health state, unspecified: Secondary | ICD-10-CM

## 2021-05-05 DIAGNOSIS — M9711XA Periprosthetic fracture around internal prosthetic right knee joint, initial encounter: Secondary | ICD-10-CM | POA: Diagnosis present

## 2021-05-05 DIAGNOSIS — Z7902 Long term (current) use of antithrombotics/antiplatelets: Secondary | ICD-10-CM

## 2021-05-05 DIAGNOSIS — D72829 Elevated white blood cell count, unspecified: Secondary | ICD-10-CM | POA: Diagnosis present

## 2021-05-05 DIAGNOSIS — J449 Chronic obstructive pulmonary disease, unspecified: Secondary | ICD-10-CM | POA: Diagnosis present

## 2021-05-05 DIAGNOSIS — S72401A Unspecified fracture of lower end of right femur, initial encounter for closed fracture: Principal | ICD-10-CM | POA: Diagnosis present

## 2021-05-05 DIAGNOSIS — Z96641 Presence of right artificial hip joint: Secondary | ICD-10-CM | POA: Diagnosis present

## 2021-05-05 DIAGNOSIS — Z7951 Long term (current) use of inhaled steroids: Secondary | ICD-10-CM

## 2021-05-05 HISTORY — DX: Cerebral infarction, unspecified: I63.9

## 2021-05-05 LAB — COMPREHENSIVE METABOLIC PANEL
ALT: 81 U/L — ABNORMAL HIGH (ref 0–44)
AST: 81 U/L — ABNORMAL HIGH (ref 15–41)
Albumin: 3.1 g/dL — ABNORMAL LOW (ref 3.5–5.0)
Alkaline Phosphatase: 123 U/L (ref 38–126)
Anion gap: 6 (ref 5–15)
BUN: 20 mg/dL (ref 8–23)
CO2: 25 mmol/L (ref 22–32)
Calcium: 7.9 mg/dL — ABNORMAL LOW (ref 8.9–10.3)
Chloride: 106 mmol/L (ref 98–111)
Creatinine, Ser: 1 mg/dL (ref 0.44–1.00)
GFR, Estimated: 55 mL/min — ABNORMAL LOW (ref >60)
Glucose, Bld: 130 mg/dL — ABNORMAL HIGH (ref 70–99)
Potassium: 4.1 mmol/L (ref 3.5–5.1)
Sodium: 137 mmol/L (ref 135–145)
Total Bilirubin: 0.5 mg/dL (ref 0.3–1.2)
Total Protein: 5.3 g/dL — ABNORMAL LOW (ref 6.5–8.1)

## 2021-05-05 LAB — CBC WITH DIFFERENTIAL/PLATELET
Abs Immature Granulocytes: 0.06 10*3/uL (ref 0.00–0.07)
Basophils Absolute: 0.1 10*3/uL (ref 0.0–0.1)
Basophils Relative: 1 %
Eosinophils Absolute: 0.5 10*3/uL (ref 0.0–0.5)
Eosinophils Relative: 4 %
HCT: 27.7 % — ABNORMAL LOW (ref 36.0–46.0)
Hemoglobin: 8.4 g/dL — ABNORMAL LOW (ref 12.0–15.0)
Immature Granulocytes: 1 %
Lymphocytes Relative: 19 %
Lymphs Abs: 2.5 10*3/uL (ref 0.7–4.0)
MCH: 28.4 pg (ref 26.0–34.0)
MCHC: 30.3 g/dL (ref 30.0–36.0)
MCV: 93.6 fL (ref 80.0–100.0)
Monocytes Absolute: 1.5 10*3/uL — ABNORMAL HIGH (ref 0.1–1.0)
Monocytes Relative: 11 %
Neutro Abs: 8.5 10*3/uL — ABNORMAL HIGH (ref 1.7–7.7)
Neutrophils Relative %: 64 %
Platelets: 283 10*3/uL (ref 150–400)
RBC: 2.96 MIL/uL — ABNORMAL LOW (ref 3.87–5.11)
RDW: 18 % — ABNORMAL HIGH (ref 11.5–15.5)
WBC: 13 10*3/uL — ABNORMAL HIGH (ref 4.0–10.5)
nRBC: 0 % (ref 0.0–0.2)

## 2021-05-05 LAB — PROTIME-INR
INR: 1 (ref 0.8–1.2)
Prothrombin Time: 13.5 seconds (ref 11.4–15.2)

## 2021-05-05 LAB — RESP PANEL BY RT-PCR (FLU A&B, COVID) ARPGX2
Influenza A by PCR: NEGATIVE
Influenza B by PCR: NEGATIVE
SARS Coronavirus 2 by RT PCR: NEGATIVE

## 2021-05-05 MED ORDER — PANTOPRAZOLE SODIUM 40 MG IV SOLR
40.0000 mg | INTRAVENOUS | Status: DC
  Administered 2021-05-06 – 2021-05-07 (×2): 40 mg via INTRAVENOUS
  Filled 2021-05-05 (×2): qty 40

## 2021-05-05 MED ORDER — FENTANYL CITRATE (PF) 100 MCG/2ML IJ SOLN
25.0000 ug | INTRAMUSCULAR | Status: DC | PRN
  Administered 2021-05-05 – 2021-05-06 (×3): 25 ug via INTRAVENOUS
  Filled 2021-05-05 (×3): qty 2

## 2021-05-05 MED ORDER — FENTANYL CITRATE (PF) 100 MCG/2ML IJ SOLN
50.0000 ug | Freq: Once | INTRAMUSCULAR | Status: AC
  Administered 2021-05-05: 50 ug via INTRAVENOUS
  Filled 2021-05-05: qty 2

## 2021-05-05 MED ORDER — SODIUM CHLORIDE 0.9 % IV SOLN: Freq: Once | INTRAVENOUS | Status: AC

## 2021-05-05 MED ORDER — ONDANSETRON HCL 4 MG/2ML IJ SOLN
4.0000 mg | Freq: Once | INTRAMUSCULAR | Status: AC
  Administered 2021-05-05: 4 mg via INTRAVENOUS
  Filled 2021-05-05: qty 2

## 2021-05-05 NOTE — ED Provider Notes (Signed)
Patient signed out to me from [place], [place].  Patient was walking out onto her deck without using her walker and tripped and fell causing injury to her right femur.  Imaging obtained and is significant for a mid shaft femur fracture.  She does have a right hip and knee arthroplasty procedures, [place], the hip by Dr. Charlann Boxer in 2020.  Past ankle history is significant for COPD, hypertension, history of CVA.  Patient is on aspirin and Plavix.  Call placed to Dr. Thomasena Edis who is on-call for Dr. Nilsa Nutting group, defers to on-call orthopedist.  Call placed to Dr. Dallas Schimke. Pt's injury is more complicated than can be repaired here. He has spoken with Dr. Jena Gauss in [place] who accepts pt. She will need to be transferred to [facility] and will plan surgery for tomorrow, so NPO after midnight.  Patient will need medical admission.  Discussed with Dr. Thomes Dinning.   Call placed to daughter [name], with patients permission to advise of diagnosis and need for admission.      Burgess Amor, PA-C 05/05/21 2212    Blane Ohara, MD 05/06/21 [phone]

## 2021-05-05 NOTE — H&P (Signed)
History and Physical  IZADORA ROEHR WGN:[id number] DOB: May 01, 1932 DOA: 05/05/2021  Referring physician: Burgess Amor, PA-C  PCP: Richmond Campbell., PA-C  Patient coming from: Home  Chief Complaint: Right leg pain  HPI: [name] [name] is a 87 y.o. female with medical history significant for  rheumatoid arthritis, oxygen dependent COPD from former smoking, hypertension, prior strokes, falls, anxiety disorder who presents to the emergency department after sustaining a fall at home.  Patient states that she was trying to step out of the doorway onto the deck of her house without using walker and she sustained a fall with difficulty in being able to bear weight on the right leg due to right thigh/hip pain.  Patient denies any head injury, chest pain, shortness of breath, numbness or tingling in the extremities.  ED Course:  In the emergency department, she was hemodynamically stable, work-up in the ED showed leukocytosis, normocytic anemia, hypoalbuminemia and transaminitis.  Influenza A, B, SARS coronavirus 2 was negative. CT of head without contrast showed no acute intracranial abnormality Chest x-ray showed no active disease; pelvis x-ray was negative for pelvic fracture and showed right hip replacement Right femoral x-ray showed fracture mid to distal femur on the right with mild angulation and displacement IV fentanyl was given due to pain, Zofran was given. Orthopedic surgeon on call was consulted and he made an arrangement for patient to be admitted to [facility] per ED PA.  Hospitalist was asked to admit patient for further evaluation and management.  Review of Systems: Constitutional: Negative for chills and fever.  HENT: Negative for ear pain and sore throat.   Eyes: Negative for pain and visual disturbance.  Respiratory: Negative for cough, chest tightness and shortness of breath.   Cardiovascular: Negative for chest pain and palpitations.  Gastrointestinal: Negative for abdominal  pain and vomiting.  Endocrine: Negative for polyphagia and polyuria.  Genitourinary: Negative for decreased urine volume, dysuria, enuresis Musculoskeletal: Positive for right thigh and hip pain. Skin: Negative for color change and rash.  Allergic/Immunologic: Negative for immunocompromised state.  Neurological: Negative for tremors, syncope, speech difficulty, weakness, light-headedness and headaches.  Hematological: Does not bruise/bleed easily.  All other systems reviewed and are negative  Past Medical History:  Diagnosis Date   Anxiety    Arthritis    rheumatoid   COPD (chronic obstructive pulmonary disease) (HCC)    Fall    Hypertension    Stroke [facility])    Past Surgical History:  Procedure Laterality Date   APPENDECTOMY     CATARACT EXTRACTION W/PHACO Right 11/27/2013   Procedure: CATARACT EXTRACTION PHACO AND INTRAOCULAR LENS PLACEMENT (IOC);  Surgeon: Loraine Leriche T. Nile Riggs, MD;  Location: [facility] ORS;  Service: Ophthalmology;  Laterality: Right;  CDE:7.31   CATARACT EXTRACTION W/PHACO Left 12/11/2013   Procedure: CATARACT EXTRACTION PHACO AND INTRAOCULAR LENS PLACEMENT (IOC);  Surgeon: Loraine Leriche T. Nile Riggs, MD;  Location: [facility];  Service: Ophthalmology;  Laterality: Left;  CDE:7.94   HIP ARTHROPLASTY Right 01/02/2019   Procedure: ARTHROPLASTY BIPOLAR HIP (HEMIARTHROPLASTY);  Surgeon: Durene Romans, MD;  Location: [facility] OR[facility];  Service: Orthopedics;  Laterality: Right;   HUMERUS FRACTURE SURGERY Left    had surgery x3 on it.   REPLACEMENT TOTAL KNEE BILATERAL      Social History:  reports that she has quit smoking. Her smoking use included cigarettes. She has a 60.00 pack-year smoking history. She quit smokeless tobacco use about 23 years ago. She reports current alcohol use. She reports that she does not use  drugs.   Allergies  Allergen Reactions   Penicillins Swelling, Anaphylaxis, Hives and Nausea And Vomiting    Has patient had a PCN reaction causing immediate rash, facial/tongue/throat  swelling, SOB or lightheadedness with hypotension: Yes Has patient had a PCN reaction causing severe rash involving mucus membranes or skin necrosis: No Has patient had a PCN reaction that required hospitalization No Has patient had a PCN reaction occurring within the last 10 years: No If all of the above answers are "NO", then may proceed with Cephalosporin use.  Has patient had a PCN reaction causing immediate rash, facial/tongue/throat swelling, SOB or lightheadedness with hypotension: Yes Has patient had a PCN reaction causing severe rash involving mucus membranes or skin necrosis: No Has patient had a PCN reaction that required hospitalization No Has patient had a PCN reaction occurring within the last 10 years: No If all of the above answers are "NO", then may proceed with Cephalosporin use.    Sulfa Antibiotics Hives   Cortisone Other (See Comments)    Irregular Heart Beat   Morphine And Related Nausea And Vomiting   Vicodin [Hydrocodone-Acetaminophen] Nausea And Vomiting   Motrin [Ibuprofen]     Family History  Problem Relation Age of Onset   Hypertension Other      Prior to Admission medications   Medication Sig Start Date End Date Taking? Authorizing Provider  acetaminophen (TYLENOL) 500 MG tablet Take 2 tablets (1,000 mg total) by mouth every 8 (eight) hours. 01/02/19  Yes Babish, Molli Hazard, PA-C  albuterol (PROVENTIL HFA;VENTOLIN HFA) 108 (90 BASE) MCG/ACT inhaler Inhale 2 puffs into the lungs every 6 (six) hours as needed for wheezing or shortness of breath.   Yes [provider]  ALPRAZolam (XANAX) 0.5 MG tablet Take 0.25-0.5 mg by mouth 2 (two) times daily. Take 0.25 every morning and 0.5 mg every evening   Yes [provider]  amLODipine (NORVASC) 5 MG tablet Take 1 tablet (5 mg total) by mouth daily. 06/29/18 05/05/21 Yes Johnson, Clanford L, MD  aspirin EC 81 MG tablet Take 1 tablet (81 mg total) by mouth daily. Swallow whole. 02/17/21  Yes Johnson,  Clanford L, MD  atorvastatin (LIPITOR) 20 MG tablet Take 1 tablet (20 mg total) by mouth every evening. 02/17/21  Yes Johnson, Clanford L, MD  budesonide-formoterol (SYMBICORT) 160-4.5 MCG/ACT inhaler Inhale 2 puffs into the lungs 2 (two) times daily. 01/30/18  Yes [provider]  cholecalciferol (VITAMIN D) 1000 UNITS tablet Take 1,000 Units by mouth daily.   Yes [provider]  clopidogrel (PLAVIX) 75 MG tablet Take 1 tablet (75 mg total) by mouth daily. 02/18/21 05/19/21 Yes Johnson, Clanford L, MD  feeding supplement, ENSURE ENLIVE, (ENSURE ENLIVE) LIQD Take 237 mLs by mouth 2 (two) times daily between meals. 01/08/19  Yes Almon Hercules, MD  fluticasone (FLONASE) 50 MCG/ACT nasal spray Place 2 sprays into both nostrils at bedtime.  10/12/17  Yes [provider]  hydroxypropyl methylcellulose / hypromellose (ISOPTO TEARS / GONIOVISC) 2.5 % ophthalmic solution Place 1 drop into both eyes 3 (three) times daily as needed for dry eyes.   Yes [provider]  leflunomide (ARAVA) 20 MG tablet Take 1 tablet (20 mg total) by mouth daily. 02/17/21  Yes Johnson, Clanford L, MD  metoprolol tartrate (LOPRESSOR) 25 MG tablet Take 1 tablet (25 mg total) by mouth 2 (two) times daily. 06/28/18 05/05/21 Yes Johnson, Clanford L, MD  Multiple Vitamin (MULTIVITAMIN WITH MINERALS) TABS tablet Take 1 tablet by mouth daily.  Yes [provider]  pantoprazole (PROTONIX) 40 MG tablet Take 1 tablet (40 mg total) by mouth daily at 6 (six) AM. 02/18/21  Yes Johnson, Clanford L, MD  ramipril (ALTACE) 10 MG capsule Take 20 mg by mouth daily.   Yes [provider]  senna-docusate (SENOKOT-S) 8.6-50 MG tablet Take 1 tablet by mouth at bedtime as needed for mild constipation. 01/08/19  Yes Almon Hercules, MD    Physical Exam: BP (!) 105/58   Pulse 73   Temp 97.7 F (36.5 C) (Oral)   Resp 18   Ht 5\' 1"  (1.549 m)   Wt 40.4 kg   SpO2 98%   BMI 16.82 kg/m   General: 87 y.o.  year-old female well developed well nourished in no acute distress.  Alert and oriented x3. HEENT: Dry mucous membrane.  NCAT, EOMI Neck: Supple, trachea medial Cardiovascular: Regular rate and rhythm with no rubs or gallops.  No thyromegaly or JVD noted.  No lower extremity edema. 2/4 pulses in all 4 extremities. Respiratory: Clear to auscultation with no wheezes or rales. Good inspiratory effort. Abdomen: Soft, nontender nondistended with normal bowel sounds x4 quadrants. Muskuloskeletal: Right thigh in traction and tender to touch. No cyanosis or clubbing noted bilaterally Neuro: CN II-XII intact, sensation and reflexes intact Skin: No ulcerative lesions noted or rashes Psychiatry: Mood is appropriate for condition and setting          Labs on Admission:  Basic Metabolic Panel: Recent Labs  Lab 05/05/21 1901  NA 137  K 4.1  CL 106  CO2 25  GLUCOSE 130*  BUN 20  CREATININE 1.00  CALCIUM 7.9*   Liver Function Tests: Recent Labs  Lab 05/05/21 1901  AST 81*  ALT 81*  ALKPHOS 123  BILITOT 0.5  PROT 5.3*  ALBUMIN 3.1*   No results for input(s): LIPASE, AMYLASE in the last 168 hours. No results for input(s): AMMONIA in the last 168 hours. CBC: Recent Labs  Lab 05/05/21 1901  WBC 13.0*  NEUTROABS 8.5*  HGB 8.4*  HCT 27.7*  MCV 93.6  PLT 283   Cardiac Enzymes: No results for input(s): CKTOTAL, CKMB, CKMBINDEX, TROPONINI in the last 168 hours.  BNP (last 3 results) No results for input(s): BNP in the last 8760 hours.  ProBNP (last 3 results) No results for input(s): PROBNP in the last 8760 hours.  CBG: No results for input(s): GLUCAP in the last 168 hours.  Radiological Exams on Admission: DG Pelvis 1-2 Views  Result Date: 05/05/2021 CLINICAL DATA:  Fall EXAM: PELVIS - 1-2 VIEW COMPARISON:  01/02/2019 FINDINGS: Right hip replacement in satisfactory position and alignment. No pelvic fracture. Normal left hip. Atherosclerotic calcification noted. IMPRESSION:  Right hip replacement.  Negative for pelvic fracture. Electronically Signed   By: 01/04/2019 M.D.   On: 05/05/2021 19:06   CT Head Wo Contrast  Result Date: 05/05/2021 CLINICAL DATA:  Head trauma.  Fall at home. EXAM: CT HEAD WITHOUT CONTRAST TECHNIQUE: Contiguous axial images were obtained from the base of the skull through the vertex without intravenous contrast. COMPARISON:  Head CT and brain MRI 02/15/2021 FINDINGS: Brain: No intracranial hemorrhage, mass effect, or midline shift. Stable degree of atrophy. No hydrocephalus. The basilar cisterns are patent. Advanced chronic small vessel ischemia. No definite CT correlate for the multiple punctate infarcts in the left cerebral hemisphere on prior MRI. No evidence of territorial infarct or acute ischemia. No extra-axial or intracranial fluid collection. Vascular: Punctate calcifications in the left frontal  lobe are unchanged from prior exam. There is no hyperdense vessel. Skull: No fracture or focal lesion. Sinuses/Orbits: Paranasal sinuses and mastoid air cells are clear. The visualized orbits are unremarkable. Bilateral lens extraction. Other: None. IMPRESSION: 1. No acute intracranial abnormality. No skull fracture. 2. Stable atrophy and chronic small vessel ischemia. Electronically Signed   By: Narda Rutherford M.D.   On: 05/05/2021 20:22   DG Chest Portable 1 View  Result Date: 05/05/2021 CLINICAL DATA:  Fall EXAM: PORTABLE CHEST 1 VIEW COMPARISON:  02/15/2021 FINDINGS: The heart size and mediastinal contours are within normal limits. Both lungs are clear. The visualized skeletal structures are unremarkable. Atherosclerotic aortic arch. IMPRESSION: No active disease. Electronically Signed   By: Marlan Palau M.D.   On: 05/05/2021 19:29   DG Femur Portable 1 View Right  Result Date: 05/05/2021 CLINICAL DATA:  Fall. EXAM: RIGHT FEMUR PORTABLE 1 VIEW COMPARISON:  12/31/2018 FINDINGS: Right hip replacement in satisfactory position and alignment.  There is an oblique fracture of the mid to distal femur with displacement and mild angulation. Right knee replacement is present. The fracture ends above the prosthesis. IMPRESSION: Fracture mid to distal femur on the right with mild angulation and displacement Right hip and right knee replacement. Electronically Signed   By: Marlan Palau M.D.   On: 05/05/2021 19:05    EKG: I independently viewed the EKG done and my findings are as followed: EKG was not done in the ED  Assessment/Plan Present on Admission:  Right femoral fracture (HCC)  COPD (chronic obstructive pulmonary disease) (HCC)  Hypertension  Anxiety  Principal Problem:   Right femoral fracture (HCC) Active Problems:   Hypertension   COPD (chronic obstructive pulmonary disease) (HCC)   Anxiety   History of rheumatoid arthritis   Fall at home   Hyponatremia   Dehydration   Hypoalbuminemia   History of stroke   GERD (gastroesophageal reflux disease)   Right femoral fracture secondary to fall at home Right femoral x-ray showed fracture mid to distal femur on the right with mild angulation and displacement Continue IV fentanyl RN Continue fall precaution and neurochecks Continue PT/OT eval and treat Orthopedic surgeon was consulted and recommended admitting patient to [facility]  Hyponatremia possibly due to dehydration Na 130, continue IV hydration  Hypoalbuminemia possibly secondary to mild protein calorie malnutrition Consider protein supplement when patient resumes oral intake  History of stroke Continue home meds when patient resumes oral intake Continue PT/OT eval and treat  GERD Continue Protonix  History of rheumatoid arthritis Continue home meds when patient resumes oral intake  COPD Continue albuterol per home regimen  Hypertension BP meds will be held at this time due to soft blood pressure  Anxiety Continue home meds when patient resumes oral intake    DVT prophylaxis: SCDs  Code  Status: DNR  Family Communication: None at bedside  Disposition Plan:  Patient is from:                        home Anticipated DC to:                   SNF or family members home Anticipated DC date:               2-3 days Anticipated DC barriers:          Patient requires inpatient management due to fracture which requires surgical intervention pending orthopedic consult    Consults called: Orthopedic surgery  Admission status: Inpatient    Frankey Shown MD [facility]  05/05/2021, 11:41 PM

## 2021-05-05 NOTE — ED Provider Notes (Addendum)
[facility] EMERGENCY DEPARTMENT Provider Note   CSN: [id number] Arrival date & time: 05/05/21  1751     History Chief Complaint  Patient presents with   Leg Pain    [name] [name] is a 87 y.o. female.   Leg Pain Associated symptoms: no back pain, no fatigue, no fever and no neck pain       [name] [name] is a 87 y.o. female with past medical history of COPD, hypertension, prior strokes and anticoagulated with clopidogrel and aspirin.  She presents to the Emergency Department complaining of pain to her right upper leg/hip.  She states that she was attempting to step out of the doorway onto the deck and fell.  She admits to not using her walker at the time of the fall.  She is supposed to use a walker at baseline.  She has history of a hip replacement surgery in 2020 and bilateral knee replacements in the early 2000's.  She denies head injury or LOC, chest pain, shortness of breath or numbness of her lower extremities. Have spoken with patient's daughter, [name], who is also her healthcare power of attorney and confirmed history.  Pt is DNR  Past Medical History:  Diagnosis Date   Anxiety    Arthritis    rheumatoid   COPD (chronic obstructive pulmonary disease) (HCC)    Fall    Hypertension    Stroke [facility])     Patient Active Problem List   Diagnosis Date Noted   Acute CVA (cerebrovascular accident) (HCC) 02/15/2021   Fall at home 02/15/2021   Former smoker    Postoperative fever 01/03/2019   Hypokalemia 01/03/2019   History of rheumatoid arthritis 01/03/2019   Closed right hip fracture, initial encounter (HCC) 12/31/2018   Hypertension 06/27/2018   COPD (chronic obstructive pulmonary disease) (HCC) 06/27/2018   Anxiety 06/27/2018   Headache 06/27/2018    Past Surgical History:  Procedure Laterality Date   APPENDECTOMY     CATARACT EXTRACTION W/PHACO Right 11/27/2013   Procedure: CATARACT EXTRACTION PHACO AND INTRAOCULAR LENS PLACEMENT (IOC);  Surgeon:  Loraine Leriche T. Nile Riggs, MD;  Location: [facility];  Service: Ophthalmology;  Laterality: Right;  CDE:7.31   CATARACT EXTRACTION W/PHACO Left 12/11/2013   Procedure: CATARACT EXTRACTION PHACO AND INTRAOCULAR LENS PLACEMENT (IOC);  Surgeon: Loraine Leriche T. Nile Riggs, MD;  Location: [facility];  Service: Ophthalmology;  Laterality: Left;  CDE:7.94   HIP ARTHROPLASTY Right 01/02/2019   Procedure: ARTHROPLASTY BIPOLAR HIP (HEMIARTHROPLASTY);  Surgeon: Durene Romans, MD;  Location: [facility];  Service: Orthopedics;  Laterality: Right;   HUMERUS FRACTURE SURGERY Left    had surgery x3 on it.   REPLACEMENT TOTAL KNEE BILATERAL       OB History   No obstetric history on file.     Family History  Problem Relation Age of Onset   Hypertension Other     Social History   Tobacco Use   Smoking status: Former    Packs/day: 2.00    Years: 30.00    Pack years: 60.00    Types: Cigarettes   Smokeless tobacco: Former    Quit date: 11/22/1997  Vaping Use   Vaping Use: Never used  Substance Use Topics   Alcohol use: Yes    Comment: 1 mixed drink at night   Drug use: No    Home Medications Prior to Admission medications   Medication Sig Start Date End Date Taking? Authorizing Provider  acetaminophen (TYLENOL) 500 MG tablet Take 2 tablets (1,000 mg  total) by mouth every 8 (eight) hours. 01/02/19   [name], PA-C  albuterol (PROVENTIL HFA;VENTOLIN HFA) 108 (90 BASE) MCG/ACT inhaler Inhale 2 puffs into the lungs every 6 (six) hours as needed for wheezing or shortness of breath.    [provider]  ALPRAZolam Prudy Feeler) 0.5 MG tablet Take 0.25-0.5 mg by mouth 2 (two) times daily. Take 0.25 every morning and 0.5 mg every evening    [provider]  amLODipine (NORVASC) 5 MG tablet Take 1 tablet (5 mg total) by mouth daily. 06/29/18 06/27/19  Cleora Fleet, MD  aspirin EC 81 MG tablet Take 1 tablet (81 mg total) by mouth daily. Swallow whole. 02/17/21   Johnson, Clanford L, MD  atorvastatin (LIPITOR) 20 MG  tablet Take 1 tablet (20 mg total) by mouth every evening. 02/17/21   Johnson, Clanford L, MD  budesonide-formoterol (SYMBICORT) 160-4.5 MCG/ACT inhaler Inhale 2 puffs into the lungs 2 (two) times daily. 01/30/18   [provider]  cholecalciferol (VITAMIN D) 1000 UNITS tablet Take 1,000 Units by mouth daily.    [provider]  clopidogrel (PLAVIX) 75 MG tablet Take 1 tablet (75 mg total) by mouth daily. 02/18/21 05/19/21  Johnson, Clanford L, MD  feeding supplement, ENSURE ENLIVE, (ENSURE ENLIVE) LIQD Take 237 mLs by mouth 2 (two) times daily between meals. 01/08/19   Almon Hercules, MD  fluticasone (FLONASE) 50 MCG/ACT nasal spray Place 2 sprays into both nostrils at bedtime.  10/12/17   [provider]  hydroxypropyl methylcellulose / hypromellose (ISOPTO TEARS / GONIOVISC) 2.5 % ophthalmic solution Place 1 drop into both eyes 3 (three) times daily as needed for dry eyes.    [provider]  leflunomide (ARAVA) 20 MG tablet Take 1 tablet (20 mg total) by mouth daily. 02/17/21   Johnson, Clanford L, MD  metoprolol tartrate (LOPRESSOR) 25 MG tablet Take 1 tablet (25 mg total) by mouth 2 (two) times daily. 06/28/18 06/27/19  Cleora Fleet, MD  Multiple Vitamin (MULTIVITAMIN WITH MINERALS) TABS tablet Take 1 tablet by mouth daily.    [provider]  pantoprazole (PROTONIX) 40 MG tablet Take 1 tablet (40 mg total) by mouth daily at 6 (six) AM. 02/18/21   Johnson, Clanford L, MD  ramipril (ALTACE) 10 MG capsule Take 20 mg by mouth daily.    [provider]  senna-docusate (SENOKOT-S) 8.6-50 MG tablet Take 1 tablet by mouth at bedtime as needed for mild constipation. 01/08/19   Almon Hercules, MD    Allergies    Penicillins, Sulfa antibiotics, Cortisone, Morphine and related, Vicodin [hydrocodone-acetaminophen], and Motrin [ibuprofen]  Review of Systems   Review of Systems  Constitutional:  Negative for chills, fatigue and fever.  Respiratory:  Negative  for shortness of breath.   Cardiovascular:  Negative for chest pain.  Gastrointestinal:  Negative for abdominal pain, nausea and vomiting.  Genitourinary:  Negative for dysuria.  Musculoskeletal:  Positive for arthralgias (right upper leg and hip pain). Negative for back pain and neck pain.  Skin:  Negative for rash and wound.  Neurological:  Negative for dizziness, syncope, weakness, numbness and headaches.  Hematological:  Does not bruise/bleed easily.   Physical Exam Updated Vital Signs BP (!) 136/49   Pulse 71   Temp 98.2 F (36.8 C) (Oral)   Resp 18   Ht  (1.549 m)   Wt 40.4 kg   SpO2 100%   BMI 16.82 kg/m   Physical Exam Vitals and nursing note reviewed.  Constitutional:      General: She is not in acute distress.    Appearance: Normal appearance.  HENT:     Head: Atraumatic.     Mouth/Throat:     Mouth: Mucous membranes are moist.  Eyes:     Extraocular Movements: Extraocular movements intact.     Conjunctiva/sclera: Conjunctivae normal.     Pupils: Pupils are equal, round, and reactive to light.  Cardiovascular:     Rate and Rhythm: Normal rate and regular rhythm.     Pulses: Normal pulses.  Pulmonary:     Effort: Pulmonary effort is normal. No respiratory distress.     Breath sounds: Normal breath sounds.  Chest:     Chest wall: No tenderness.  Abdominal:     Palpations: Abdomen is soft.     Tenderness: There is no abdominal tenderness.  Musculoskeletal:        General: Tenderness and signs of injury present.     Cervical back: Normal range of motion. No tenderness.     Comments: Patient arrived with traction applied to the right lower extremity.  Tenderness of the distal right upper leg and hip.  Edema noted to dorsum of bilateral feet.  Ecchymosis noted of the dorsal right foot.  No open wounds to the lower extremity.  Skin:    General: Skin is warm.     Capillary Refill: Capillary refill takes less than 2 seconds. Palpable dorsalis pedis pulses  bilaterally. Neurological:     General: No focal deficit present.     Mental Status: She is alert.     Sensory: No sensory deficit.     Motor: No weakness.    ED Results / Procedures / Treatments   Labs (all labs ordered are listed, but only abnormal results are displayed) Labs Reviewed  COMPREHENSIVE METABOLIC PANEL - Abnormal; Notable for the following components:      Result Value   Glucose, Bld 130 (*)    Calcium 7.9 (*)    Total Protein 5.3 (*)    Albumin 3.1 (*)    AST 81 (*)    ALT 81 (*)    GFR, Estimated 55 (*)    All other components within normal limits  CBC WITH DIFFERENTIAL/PLATELET - Abnormal; Notable for the following components:   WBC 13.0 (*)    RBC 2.96 (*)    Hemoglobin 8.4 (*)    HCT 27.7 (*)    RDW 18.0 (*)    Neutro Abs 8.5 (*)    Monocytes Absolute 1.5 (*)    All other components within normal limits  PROTIME-INR    EKG None  Radiology No results found.  Procedures Procedures   Medications Ordered in ED Medications - No data to display  ED Course  I have reviewed the triage vital signs and the nursing notes.  Pertinent labs & imaging results that were available during my care of the patient were reviewed by me and considered in my medical decision making (see chart for details).    MDM Rules/Calculators/A&P                          Patient here for evaluation of right upper leg pain secondary to mechanical fall.  Patient uses walker at baseline and was attempting to make a step onto her deck without using her walker.  She is anticoagulated with clopidogrel and aspirin.  Denies head injury or LOC.  Have spoken with patient's daughter, Arline Asp  Morris, who is also healthcare power of attorney.  ( Cell phone# [phone]) No loss of consciousness at time of injury.  No head injury witnessed.   On exam, she is tender in the distal portion of the right upper leg.  There is traction applied prior to arrival.  Neurovascularly intact.  X-ray  results pending.  Initial evaluation of portable right femur shows fracture.  Given patient's age and use of anticoagulants, CT head was also ordered.  She was given pain medication prior to arrival. Discussed patient care with Burgess Amor, PA-C at end of shift.  Imaging results pending.  Labs reviewed by me.  INR 1.  Electrolytes without acute derangement.  Mild leukocytosis.  Hemoglobin 8.4, one-point drop from 2 months ago. She will likely need admission and consult with orthopedics.     Final Clinical Impression(s) / ED Diagnoses Final diagnoses:  Pain    Rx / DC Orders ED Discharge Orders     None        Pauline Aus, PA-C 05/05/21 1945    Pauline Aus, PA-C 05/05/21 1946    Blane Ohara, MD 05/06/21 [phone]

## 2021-05-05 NOTE — ED Notes (Signed)
Took bottom denture out. Placed at bedside

## 2021-05-05 NOTE — ED Triage Notes (Signed)
Patient from home after sustaining mechanical fall while not using walker. Complaining of right leg pain. EMS placed traction and administered Fentanyl PTA. Noted with bilateral foot edema. Positive pedal pulse right foot.

## 2021-05-06 ENCOUNTER — Inpatient Hospital Stay (HOSPITAL_COMMUNITY): Payer: Medicare HMO | Admitting: Certified Registered Nurse Anesthetist

## 2021-05-06 ENCOUNTER — Encounter (HOSPITAL_COMMUNITY): Payer: Self-pay | Admitting: Internal Medicine

## 2021-05-06 ENCOUNTER — Inpatient Hospital Stay (HOSPITAL_COMMUNITY): Payer: Medicare HMO

## 2021-05-06 ENCOUNTER — Encounter (HOSPITAL_COMMUNITY)
Admission: EM | Disposition: A | Discharge status: Skilled Nursing Facility | Payer: Self-pay | Source: Home / Self Care | Attending: Hospitalist

## 2021-05-06 HISTORY — PX: ORIF FEMUR FRACTURE: SHX2119

## 2021-05-06 LAB — COMPREHENSIVE METABOLIC PANEL
ALT: 87 U/L — ABNORMAL HIGH (ref 0–44)
AST: 79 U/L — ABNORMAL HIGH (ref 15–41)
Albumin: 3.2 g/dL — ABNORMAL LOW (ref 3.5–5.0)
Alkaline Phosphatase: 117 U/L (ref 38–126)
Anion gap: 9 (ref 5–15)
BUN: 25 mg/dL — ABNORMAL HIGH (ref 8–23)
CO2: 22 mmol/L (ref 22–32)
Calcium: 8 mg/dL — ABNORMAL LOW (ref 8.9–10.3)
Chloride: 107 mmol/L (ref 98–111)
Creatinine, Ser: 1.11 mg/dL — ABNORMAL HIGH (ref 0.44–1.00)
GFR, Estimated: 48 mL/min — ABNORMAL LOW (ref >60)
Glucose, Bld: 188 mg/dL — ABNORMAL HIGH (ref 70–99)
Potassium: 4.4 mmol/L (ref 3.5–5.1)
Sodium: 138 mmol/L (ref 135–145)
Total Bilirubin: 0.9 mg/dL (ref 0.3–1.2)
Total Protein: 5.6 g/dL — ABNORMAL LOW (ref 6.5–8.1)

## 2021-05-06 LAB — APTT: aPTT: 26 seconds (ref 24–36)

## 2021-05-06 LAB — CBC
HCT: 25.8 % — ABNORMAL LOW (ref 36.0–46.0)
Hemoglobin: 7.7 g/dL — ABNORMAL LOW (ref 12.0–15.0)
MCH: 27.7 pg (ref 26.0–34.0)
MCHC: 29.8 g/dL — ABNORMAL LOW (ref 30.0–36.0)
MCV: 92.8 fL (ref 80.0–100.0)
Platelets: 273 10*3/uL (ref 150–400)
RBC: 2.78 MIL/uL — ABNORMAL LOW (ref 3.87–5.11)
RDW: 17.7 % — ABNORMAL HIGH (ref 11.5–15.5)
WBC: 10.5 10*3/uL (ref 4.0–10.5)
nRBC: 0 % (ref 0.0–0.2)

## 2021-05-06 LAB — PROTIME-INR
INR: 1 (ref 0.8–1.2)
Prothrombin Time: 13.5 seconds (ref 11.4–15.2)

## 2021-05-06 LAB — MAGNESIUM: Magnesium: 1.8 mg/dL (ref 1.7–2.4)

## 2021-05-06 LAB — CBG MONITORING, ED: Glucose-Capillary: 174 mg/dL — ABNORMAL HIGH (ref 70–99)

## 2021-05-06 LAB — PREPARE RBC (CROSSMATCH)

## 2021-05-06 LAB — PHOSPHORUS: Phosphorus: 3.8 mg/dL (ref 2.5–4.6)

## 2021-05-06 SURGERY — OPEN REDUCTION INTERNAL FIXATION (ORIF) DISTAL FEMUR FRACTURE
Anesthesia: General | Site: Leg Upper | Laterality: Right

## 2021-05-06 MED ORDER — ATORVASTATIN CALCIUM 10 MG PO TABS
20.0000 mg | ORAL_TABLET | Freq: Every evening | ORAL | Status: DC
  Administered 2021-05-06 – 2021-05-11 (×6): 20 mg via ORAL
  Filled 2021-05-06 (×6): qty 2

## 2021-05-06 MED ORDER — ONDANSETRON HCL 4 MG/2ML IJ SOLN
INTRAMUSCULAR | Status: AC
  Filled 2021-05-06: qty 2

## 2021-05-06 MED ORDER — ASPIRIN EC 81 MG PO TBEC
81.0000 mg | DELAYED_RELEASE_TABLET | Freq: Every day | ORAL | Status: DC
  Administered 2021-05-06 – 2021-05-12 (×7): 81 mg via ORAL
  Filled 2021-05-06 (×7): qty 1

## 2021-05-06 MED ORDER — EPHEDRINE 5 MG/ML INJ
INTRAVENOUS | Status: AC
  Filled 2021-05-06: qty 10

## 2021-05-06 MED ORDER — DEXAMETHASONE SODIUM PHOSPHATE 10 MG/ML IJ SOLN
INTRAMUSCULAR | Status: DC | PRN
  Administered 2021-05-06: 10 mg via INTRAVENOUS

## 2021-05-06 MED ORDER — CLINDAMYCIN PHOSPHATE 900 MG/50ML IV SOLN
INTRAVENOUS | Status: DC | PRN
  Administered 2021-05-06: 900 mg via INTRAVENOUS

## 2021-05-06 MED ORDER — FENTANYL CITRATE (PF) 250 MCG/5ML IJ SOLN
INTRAMUSCULAR | Status: AC
  Filled 2021-05-06: qty 5

## 2021-05-06 MED ORDER — SODIUM CHLORIDE 0.9 % IV SOLN: INTRAVENOUS | Status: DC | PRN

## 2021-05-06 MED ORDER — PHENYLEPHRINE 40 MCG/ML (10ML) SYRINGE FOR IV PUSH (FOR BLOOD PRESSURE SUPPORT)
PREFILLED_SYRINGE | INTRAVENOUS | Status: DC | PRN
  Administered 2021-05-06: 240 ug via INTRAVENOUS
  Administered 2021-05-06: 120 ug via INTRAVENOUS
  Administered 2021-05-06 (×2): 80 ug via INTRAVENOUS

## 2021-05-06 MED ORDER — VANCOMYCIN HCL 1000 MG IV SOLR
INTRAVENOUS | Status: DC | PRN
  Administered 2021-05-06: 1000 mg via TOPICAL

## 2021-05-06 MED ORDER — CLINDAMYCIN PHOSPHATE 900 MG/50ML IV SOLN
INTRAVENOUS | Status: AC
  Filled 2021-05-06: qty 50

## 2021-05-06 MED ORDER — PHENYLEPHRINE HCL (PRESSORS) 10 MG/ML IV SOLN: INTRAVENOUS | Status: DC | PRN

## 2021-05-06 MED ORDER — ACETAMINOPHEN 10 MG/ML IV SOLN: 500.0000 mg | Freq: Once | INTRAVENOUS | Status: DC

## 2021-05-06 MED ORDER — ROCURONIUM BROMIDE 10 MG/ML (PF) SYRINGE
PREFILLED_SYRINGE | INTRAVENOUS | Status: AC
  Filled 2021-05-06: qty 10

## 2021-05-06 MED ORDER — ALBUTEROL SULFATE HFA 108 (90 BASE) MCG/ACT IN AERS
2.0000 | INHALATION_SPRAY | Freq: Four times a day (QID) | RESPIRATORY_TRACT | Status: DC | PRN
  Administered 2021-05-09 – 2021-05-11 (×2): 2 via RESPIRATORY_TRACT
  Filled 2021-05-06: qty 6.7

## 2021-05-06 MED ORDER — AMLODIPINE BESYLATE 5 MG PO TABS
5.0000 mg | ORAL_TABLET | Freq: Every day | ORAL | Status: DC
  Administered 2021-05-07 – 2021-05-11 (×5): 5 mg via ORAL
  Filled 2021-05-06 (×6): qty 1

## 2021-05-06 MED ORDER — LIDOCAINE HCL (CARDIAC) PF 100 MG/5ML IV SOSY
PREFILLED_SYRINGE | INTRAVENOUS | Status: DC | PRN
  Administered 2021-05-06: 40 mg via INTRAVENOUS

## 2021-05-06 MED ORDER — TRANEXAMIC ACID-NACL 1000-0.7 MG/100ML-% IV SOLN
INTRAVENOUS | Status: DC | PRN
  Administered 2021-05-06: 1000 mg via INTRAVENOUS

## 2021-05-06 MED ORDER — TRANEXAMIC ACID-NACL 1000-0.7 MG/100ML-% IV SOLN
1000.0000 mg | Freq: Once | INTRAVENOUS | Status: AC
  Administered 2021-05-06: 1000 mg via INTRAVENOUS
  Filled 2021-05-06: qty 100

## 2021-05-06 MED ORDER — ACETAMINOPHEN 10 MG/ML IV SOLN
INTRAVENOUS | Status: AC
  Filled 2021-05-06: qty 100

## 2021-05-06 MED ORDER — SODIUM CHLORIDE 0.9 % IV SOLN: INTRAVENOUS | Status: DC

## 2021-05-06 MED ORDER — PHENYLEPHRINE HCL-NACL 10-0.9 MG/250ML-% IV SOLN
INTRAVENOUS | Status: DC | PRN
  Administered 2021-05-06: 60 ug/min via INTRAVENOUS

## 2021-05-06 MED ORDER — VANCOMYCIN HCL 500 MG IV SOLR
INTRAVENOUS | Status: AC
  Filled 2021-05-06: qty 1000

## 2021-05-06 MED ORDER — SODIUM CHLORIDE 0.9% FLUSH
10.0000 mL | Freq: Two times a day (BID) | INTRAVENOUS | Status: DC
  Administered 2021-05-06 – 2021-05-10 (×7): 10 mL

## 2021-05-06 MED ORDER — EPINEPHRINE 1 MG/10ML IJ SOSY
PREFILLED_SYRINGE | INTRAMUSCULAR | Status: AC
  Filled 2021-05-06: qty 10

## 2021-05-06 MED ORDER — SODIUM CHLORIDE 0.9% FLUSH: 10.0000 mL | INTRAVENOUS | Status: DC | PRN

## 2021-05-06 MED ORDER — PROPOFOL 10 MG/ML IV BOLUS
INTRAVENOUS | Status: DC | PRN
  Administered 2021-05-06: 80 mg via INTRAVENOUS

## 2021-05-06 MED ORDER — VITAMIN D 25 MCG (1000 UNIT) PO TABS
1000.0000 [IU] | ORAL_TABLET | Freq: Every day | ORAL | Status: DC
  Administered 2021-05-06 – 2021-05-07 (×2): 1000 [IU] via ORAL
  Filled 2021-05-06 (×2): qty 1

## 2021-05-06 MED ORDER — ORAL CARE MOUTH RINSE: 15.0000 mL | Freq: Once | OROMUCOSAL | Status: AC

## 2021-05-06 MED ORDER — DOCUSATE SODIUM 100 MG PO CAPS
100.0000 mg | ORAL_CAPSULE | Freq: Two times a day (BID) | ORAL | Status: DC
  Administered 2021-05-06 – 2021-05-11 (×8): 100 mg via ORAL
  Filled 2021-05-06 (×11): qty 1

## 2021-05-06 MED ORDER — LACTATED RINGERS IV SOLN: INTRAVENOUS | Status: DC | PRN

## 2021-05-06 MED ORDER — ROCURONIUM BROMIDE 10 MG/ML (PF) SYRINGE
PREFILLED_SYRINGE | INTRAVENOUS | Status: DC | PRN
  Administered 2021-05-06: 50 mg via INTRAVENOUS

## 2021-05-06 MED ORDER — LIDOCAINE HCL (PF) 2 % IJ SOLN
INTRAMUSCULAR | Status: AC
  Filled 2021-05-06: qty 30

## 2021-05-06 MED ORDER — FENTANYL CITRATE (PF) 100 MCG/2ML IJ SOLN: 25.0000 ug | INTRAMUSCULAR | Status: DC | PRN

## 2021-05-06 MED ORDER — ONDANSETRON HCL 4 MG/2ML IJ SOLN
INTRAMUSCULAR | Status: DC | PRN
  Administered 2021-05-06: 4 mg via INTRAVENOUS

## 2021-05-06 MED ORDER — CHLORHEXIDINE GLUCONATE 0.12 % MT SOLN
15.0000 mL | Freq: Once | OROMUCOSAL | Status: AC
  Administered 2021-05-06: 15 mL via OROMUCOSAL
  Filled 2021-05-06: qty 15

## 2021-05-06 MED ORDER — SUGAMMADEX SODIUM 200 MG/2ML IV SOLN
INTRAVENOUS | Status: DC | PRN
  Administered 2021-05-06: 110 mg via INTRAVENOUS
  Administered 2021-05-06 (×2): 50 mg via INTRAVENOUS

## 2021-05-06 MED ORDER — FENTANYL CITRATE (PF) 250 MCG/5ML IJ SOLN
INTRAMUSCULAR | Status: DC | PRN
  Administered 2021-05-06: 50 ug via INTRAVENOUS
  Administered 2021-05-06: 25 ug via INTRAVENOUS

## 2021-05-06 MED ORDER — PROPOFOL 10 MG/ML IV BOLUS
INTRAVENOUS | Status: AC
  Filled 2021-05-06: qty 20

## 2021-05-06 MED ORDER — OXYCODONE HCL 5 MG PO TABS: 2.5000 mg | ORAL_TABLET | Freq: Once | ORAL | Status: DC | PRN

## 2021-05-06 MED ORDER — PROCHLORPERAZINE EDISYLATE 10 MG/2ML IJ SOLN
10.0000 mg | Freq: Four times a day (QID) | INTRAMUSCULAR | Status: DC | PRN
  Administered 2021-05-06: 10 mg via INTRAVENOUS
  Filled 2021-05-06: qty 2

## 2021-05-06 MED ORDER — FERROUS SULFATE 325 (65 FE) MG PO TABS
325.0000 mg | ORAL_TABLET | Freq: Three times a day (TID) | ORAL | Status: DC
  Administered 2021-05-07 – 2021-05-12 (×17): 325 mg via ORAL
  Filled 2021-05-06 (×17): qty 1

## 2021-05-06 MED ORDER — DEXAMETHASONE SODIUM PHOSPHATE 10 MG/ML IJ SOLN
INTRAMUSCULAR | Status: AC
  Filled 2021-05-06: qty 1

## 2021-05-06 MED ORDER — OXYCODONE HCL 5 MG/5ML PO SOLN: 2.5000 mg | Freq: Once | ORAL | Status: DC | PRN

## 2021-05-06 MED ORDER — PHENYLEPHRINE 40 MCG/ML (10ML) SYRINGE FOR IV PUSH (FOR BLOOD PRESSURE SUPPORT)
PREFILLED_SYRINGE | INTRAVENOUS | Status: AC
  Filled 2021-05-06: qty 20

## 2021-05-06 MED ORDER — 0.9 % SODIUM CHLORIDE (POUR BTL) OPTIME
TOPICAL | Status: DC | PRN
  Administered 2021-05-06: 1000 mL

## 2021-05-06 MED ORDER — TRAMADOL HCL 50 MG PO TABS
50.0000 mg | ORAL_TABLET | Freq: Four times a day (QID) | ORAL | Status: DC | PRN
  Administered 2021-05-07: 50 mg via ORAL
  Filled 2021-05-06: qty 1

## 2021-05-06 MED ORDER — POLYETHYLENE GLYCOL 3350 17 G PO PACK: 17.0000 g | PACK | Freq: Every day | ORAL | Status: DC | PRN

## 2021-05-06 MED ORDER — ONDANSETRON HCL 4 MG/2ML IJ SOLN
4.0000 mg | Freq: Four times a day (QID) | INTRAMUSCULAR | Status: DC | PRN
  Administered 2021-05-06 (×2): 4 mg via INTRAVENOUS
  Filled 2021-05-06 (×2): qty 2

## 2021-05-06 MED ORDER — LACTATED RINGERS IV SOLN: INTRAVENOUS | Status: DC

## 2021-05-06 MED ORDER — CLOPIDOGREL BISULFATE 75 MG PO TABS
75.0000 mg | ORAL_TABLET | Freq: Every day | ORAL | Status: DC
  Administered 2021-05-07 – 2021-05-12 (×6): 75 mg via ORAL
  Filled 2021-05-06 (×8): qty 1

## 2021-05-06 MED ORDER — ACETAMINOPHEN 500 MG PO TABS
1000.0000 mg | ORAL_TABLET | Freq: Three times a day (TID) | ORAL | Status: DC
  Administered 2021-05-06 – 2021-05-12 (×16): 1000 mg via ORAL
  Filled 2021-05-06 (×18): qty 2

## 2021-05-06 MED ORDER — EPHEDRINE SULFATE 50 MG/ML IJ SOLN
INTRAMUSCULAR | Status: DC | PRN
  Administered 2021-05-06: 10 mg via INTRAVENOUS

## 2021-05-06 MED ORDER — TRANEXAMIC ACID-NACL 1000-0.7 MG/100ML-% IV SOLN
INTRAVENOUS | Status: AC
  Filled 2021-05-06: qty 100

## 2021-05-06 MED ORDER — CLINDAMYCIN PHOSPHATE 600 MG/50ML IV SOLN
600.0000 mg | Freq: Four times a day (QID) | INTRAVENOUS | Status: AC
  Administered 2021-05-06 – 2021-05-07 (×3): 600 mg via INTRAVENOUS
  Filled 2021-05-06 (×3): qty 50

## 2021-05-06 MED ORDER — ONDANSETRON HCL 4 MG/2ML IJ SOLN: 4.0000 mg | Freq: Once | INTRAMUSCULAR | Status: DC | PRN

## 2021-05-06 SURGICAL SUPPLY — 74 items
APL PRP STRL LF DISP 70% ISPRP (MISCELLANEOUS) ×1
BIT DRILL 4.3 (BIT) ×2 IMPLANT
BIT DRILL 4.3MM (BIT) ×1
BIT DRILL 4.3X300MM (BIT) IMPLANT
BIT DRILL LONG 3.3 (BIT) ×1 IMPLANT
BIT DRILL LONG 3.3MM (BIT) ×1
BIT DRILL QC 3.3X195 (BIT) ×2 IMPLANT
BLADE CLIPPER SURG (BLADE) IMPLANT
BNDG CMPR MED 10X6 ELC LF (GAUZE/BANDAGES/DRESSINGS) ×1
BNDG COHESIVE 6X5 TAN STRL LF (GAUZE/BANDAGES/DRESSINGS) ×3 IMPLANT
BNDG ELASTIC 6X10 VLCR STRL LF (GAUZE/BANDAGES/DRESSINGS) ×3 IMPLANT
BRUSH SCRUB EZ PLAIN DRY (MISCELLANEOUS) ×2 IMPLANT
CANISTER SUCT 3000ML PPV (MISCELLANEOUS) ×3 IMPLANT
CAP LOCK NCB (Cap) ×20 IMPLANT
CHLORAPREP W/TINT 26 (MISCELLANEOUS) ×3 IMPLANT
COVER SURGICAL LIGHT HANDLE (MISCELLANEOUS) ×3 IMPLANT
COVER WAND RF STERILE (DRAPES) ×3 IMPLANT
DRAPE C-ARM 42X72 X-RAY (DRAPES) ×3 IMPLANT
DRAPE C-ARMOR (DRAPES) ×3 IMPLANT
DRAPE HALF SHEET 40X57 (DRAPES) ×6 IMPLANT
DRAPE ORTHO SPLIT 77X108 STRL (DRAPES) ×6
DRAPE SURG 17X23 STRL (DRAPES) ×3 IMPLANT
DRAPE SURG ORHT 6 SPLT 77X108 (DRAPES) ×2 IMPLANT
DRAPE U-SHAPE 47X51 STRL (DRAPES) ×3 IMPLANT
DRILL BIT 4.3 (BIT) ×3
DRSG ADAPTIC 3X8 NADH LF (GAUZE/BANDAGES/DRESSINGS) IMPLANT
DRSG MEPILEX BORDER 4X12 (GAUZE/BANDAGES/DRESSINGS) IMPLANT
DRSG MEPILEX BORDER 4X4 (GAUZE/BANDAGES/DRESSINGS) IMPLANT
DRSG MEPILEX BORDER 4X8 (GAUZE/BANDAGES/DRESSINGS) IMPLANT
DRSG PAD ABDOMINAL 8X10 ST (GAUZE/BANDAGES/DRESSINGS) ×9 IMPLANT
ELECT REM PT RETURN 9FT ADLT (ELECTROSURGICAL) ×3
ELECTRODE REM PT RTRN 9FT ADLT (ELECTROSURGICAL) ×1 IMPLANT
GAUZE SPONGE 4X4 12PLY STRL (GAUZE/BANDAGES/DRESSINGS) ×3 IMPLANT
GLOVE SURG ENC MOIS LTX SZ6.5 (GLOVE) ×9 IMPLANT
GLOVE SURG ENC MOIS LTX SZ7.5 (GLOVE) ×12 IMPLANT
GLOVE SURG UNDER POLY LF SZ6.5 (GLOVE) ×3 IMPLANT
GLOVE SURG UNDER POLY LF SZ7.5 (GLOVE) ×3 IMPLANT
GOWN STRL REUS W/ TWL LRG LVL3 (GOWN DISPOSABLE) ×3 IMPLANT
GOWN STRL REUS W/TWL LRG LVL3 (GOWN DISPOSABLE) ×9
K-WIRE 2.0 (WIRE) ×3
K-WIRE FXSTD 280X2XNS SS (WIRE) ×1
KIT BASIN OR (CUSTOM PROCEDURE TRAY) ×3 IMPLANT
KIT TURNOVER KIT B (KITS) ×3 IMPLANT
KWIRE FXSTD 280X2XNS SS (WIRE) IMPLANT
NS IRRIG 1000ML POUR BTL (IV SOLUTION) ×3 IMPLANT
PACK TOTAL JOINT (CUSTOM PROCEDURE TRAY) ×3 IMPLANT
PAD ARMBOARD 7.5X6 YLW CONV (MISCELLANEOUS) ×6 IMPLANT
PAD CAST 4YDX4 CTTN HI CHSV (CAST SUPPLIES) ×1 IMPLANT
PADDING CAST COTTON 4X4 STRL (CAST SUPPLIES) ×3
PADDING CAST COTTON 6X4 STRL (CAST SUPPLIES) ×3 IMPLANT
PLATE DISTAL FEMUR 15H 317M RT (Plate) ×2 IMPLANT
SCREW 5.0 70MM (Screw) ×2 IMPLANT
SCREW 5.0 80MM (Screw) ×2 IMPLANT
SCREW CORT NCB SELFTAP 5.0X42 (Screw) ×2 IMPLANT
SCREW NCB 3.5X75X5X6.2XST (Screw) IMPLANT
SCREW NCB 4.0X36MM (Screw) ×2 IMPLANT
SCREW NCB 5.0X36MM (Screw) ×4 IMPLANT
SCREW NCB 5.0X75MM (Screw) ×9 IMPLANT
SCREW UNI 5.0 12MM (Screw) ×2 IMPLANT
SPONGE LAP 18X18 RF (DISPOSABLE) IMPLANT
STAPLER VISISTAT 35W (STAPLE) ×3 IMPLANT
SUCTION FRAZIER HANDLE 10FR (MISCELLANEOUS) ×3
SUCTION TUBE FRAZIER 10FR DISP (MISCELLANEOUS) ×1 IMPLANT
SUT ETHILON 3 0 PS 1 (SUTURE) ×2 IMPLANT
SUT MNCRL AB 3-0 PS2 18 (SUTURE) ×4 IMPLANT
SUT VIC AB 0 CT1 27 (SUTURE) ×3
SUT VIC AB 0 CT1 27XBRD ANBCTR (SUTURE) IMPLANT
SUT VIC AB 1 CT1 27 (SUTURE)
SUT VIC AB 1 CT1 27XBRD ANBCTR (SUTURE) IMPLANT
SUT VIC AB 2-0 CT1 27 (SUTURE) ×6
SUT VIC AB 2-0 CT1 TAPERPNT 27 (SUTURE) ×2 IMPLANT
TOWEL GREEN STERILE (TOWEL DISPOSABLE) ×6 IMPLANT
TRAY FOLEY MTR SLVR 16FR STAT (SET/KITS/TRAYS/PACK) IMPLANT
WATER STERILE IRR 1000ML POUR (IV SOLUTION) ×2 IMPLANT

## 2021-05-06 NOTE — Progress Notes (Signed)
Assumed care  at 1340, CHG bath done Report given to shortstay nurse , in OR advised NO orders for consent yet, Pt alert and oriented uses oxygen at night , IV team was able to insert a midline.

## 2021-05-06 NOTE — Plan of Care (Signed)
  Problem: Education: Goal: Knowledge of General Education information will improve Description Including pain rating scale, medication(s)/side effects and non-pharmacologic comfort measures Outcome: Progressing   

## 2021-05-06 NOTE — Anesthesia Postprocedure Evaluation (Signed)
Anesthesia Post Note  Patient: [name] [name]  Procedure(s) Performed: OPEN REDUCTION INTERNAL FIXATION (ORIF) DISTAL FEMUR FRACTURE (Right: Leg Upper)     Patient location during evaluation: PACU Anesthesia Type: General Level of consciousness: awake Pain management: pain level controlled Vital Signs Assessment: post-procedure vital signs reviewed and stable Respiratory status: spontaneous breathing, nonlabored ventilation, respiratory function stable and patient connected to nasal cannula oxygen Cardiovascular status: blood pressure returned to baseline and stable Postop Assessment: no apparent nausea or vomiting Anesthetic complications: no   No notable events documented.  Last Vitals:  Vitals:   05/06/21 2000 05/06/21 2028  BP: (!) 145/58 (!) 142/66  Pulse: 88 96  Resp: 10 16  Temp:  36.8 C  SpO2: 97% 100%    Last Pain:  Vitals:   05/06/21 2028  TempSrc: Oral  PainSc:                  Catheryn Bacon Deatrice Spanbauer

## 2021-05-06 NOTE — Transfer of Care (Signed)
Immediate Anesthesia Transfer of Care Note  Patient: [name] [name]  Procedure(s) Performed: OPEN REDUCTION INTERNAL FIXATION (ORIF) DISTAL FEMUR FRACTURE (Right: Leg Upper)  Patient Location: PACU  Anesthesia Type:General  Level of Consciousness: drowsy, patient cooperative and responds to stimulation  Airway & Oxygen Therapy: Patient Spontanous Breathing and Patient connected to face mask oxygen  Post-op Assessment: Report given to RN, Post -op Vital signs reviewed and stable and Patient moving all extremities  Post vital signs: Reviewed and stable  Last Vitals:  Vitals Value Taken Time  BP 166/93 05/06/21 1813  Temp    Pulse 100 05/06/21 1820  Resp 14 05/06/21 1820  SpO2 99 % 05/06/21 1820  Vitals shown include unvalidated device data.  Last Pain:  Vitals:   05/06/21 1340  TempSrc: Oral  PainSc: 2          Complications: No notable events documented.

## 2021-05-06 NOTE — Anesthesia Procedure Notes (Signed)
Procedure Name: Intubation Date/Time: 05/06/2021 4:28 PM Performed by: Rande Brunt, CRNA Pre-anesthesia Checklist: Patient identified, Emergency Drugs available, Suction available and Patient being monitored Patient Re-evaluated:Patient Re-evaluated prior to induction Oxygen Delivery Method: Circle System Utilized Preoxygenation: Pre-oxygenation with 100% oxygen Induction Type: IV induction Ventilation: Mask ventilation without difficulty Laryngoscope Size: Mac and 3 Grade View: Grade I Tube type: Oral Tube size: 7.0 mm Number of attempts: 1 Airway Equipment and Method: Stylet Placement Confirmation: ETT inserted through vocal cords under direct vision, positive ETCO2 and breath sounds checked- equal and bilateral Secured at: 20 cm Tube secured with: Tape Dental Injury: Teeth and Oropharynx as per pre-operative assessment

## 2021-05-06 NOTE — Progress Notes (Signed)
PT Cancellation Note  Patient Details Name: SHAUNTAY BRUNELLI MRN: [id number] DOB: Jul 10, 1932   Cancelled Treatment:    Reason Eval/Treat Not Completed: Medical issues which prohibited therapy.  Patient awaiting surgery.  Will wait for Ortho MD orders after surgery.   8:11 AM, 05/06/21 Ocie Bob, MPT Physical Therapist with [facility] [phone] office [phone] mobile phone

## 2021-05-06 NOTE — Progress Notes (Signed)
Ortho Trauma Note  Contacted by Dr. Dallas Schimke regarding patient. She has interprosthetic femur fracture. Will require ORIF. Will try to place on surgery schedule today pending OR availability. There is a chance it will be delayed until Thurs or Fri.  Roby Lofts, MD [facility] [phone] (office) [email or web address]

## 2021-05-06 NOTE — ED Notes (Signed)
Called [facility] with bed assignment and for transport to [facility]. 

## 2021-05-06 NOTE — Progress Notes (Signed)
PROGRESS NOTE   [name] [name]  ZOX:[id number] DOB: 06-06-1932 DOA: 05/05/2021 PCP: Richmond Campbell., PA-C   Chief Complaint  Patient presents with   Leg Pain   Level of care: Med-Surg  Brief Admission History:  87 y.o. female with medical history significant for  rheumatoid arthritis, oxygen dependent COPD from former smoking, hypertension, prior strokes, falls, anxiety disorder who presents to the emergency department after sustaining a fall at home.  Patient states that she was trying to step out of the doorway onto the deck of her house without using walker and she sustained a fall with difficulty in being able to bear weight on the right leg due to right thigh/hip pain.  Patient denies any head injury, chest pain, shortness of breath, numbness or tingling in the extremities.  Orthopedic surgeon on call was consulted and he made an arrangement for patient to be admitted to [facility] per ED PA.  Hospitalist was asked to admit patient for further evaluation and management.  Assessment & Plan:   Principal Problem:   Right femoral fracture (HCC) Active Problems:   Hypertension   COPD (chronic obstructive pulmonary disease) (HCC)   Anxiety   History of rheumatoid arthritis   Fall at home   Hyponatremia   Dehydration   Hypoalbuminemia   History of stroke   GERD (gastroesophageal reflux disease)   Acute right femoral fracture from fall at home  - Pt is being transferred to [facility] for operative management with Dr. Jena Gauss who has agreed to consult on her and was called in ED about her.  She is being admitted for operative management.  IV fentanyl for pain management.  PT/OT evaluation per ortho team.  Order placed for ortho team to be called when patient arrives at [facility]   Hyponatremia - Pt is clinically dehydrated and being treated with IV normal saline infusion.   Hypoalbuminemia- supplements as ordered, consult to dietitian per fracture protocol.   History of CVA - resume  home meds  when she is able to take oral again, currently NPO pending surgery for fracture repair.   RA - has been stable per daughter, resume home meds when able.   Essential hypertension - temporarily holding home meds due to dehydration and soft BPs.   GAD - stable, but plan to resume home meds when able.   GERD - protonix for GI protection.    DVT prophylaxis: SCDs Code Status: DNR  Family Communication: updated daughter Arline Asp at bedside  Disposition: anticipating SNF  Status is: Inpatient  Not inpatient appropriate, will call [facility] team and downgrade to OBS.   Dispo: The patient is from: Home              Anticipated d/c is to: SNF              Patient currently is not medically stable to d/c.   Difficult to place patient No  Consultants:  Orthopedics Dr. Jena Gauss  Procedures:  Pending   Antimicrobials:  N/a   Subjective: Pt reports that pain is better controlled with pain meds now.   Objective: Vitals:   05/06/21 1100 05/06/21 1130 05/06/21 1200 05/06/21 1340  BP: (!) 146/73 139/68 120/74 (!) 108/93  Pulse: (!) 111   80  Resp: 15 16 17 16   Temp:    98.3 F (36.8 C)  TempSrc:    Oral  SpO2: 100%  100% 100%  Weight:      Height:       No  intake or output data in the 24 hours ending 05/06/21 1604 Filed Weights   05/05/21 1806  Weight: 40.4 kg    Examination:  General exam: Frail elderly female, awake, alert, NAD, cooperative. Appears calm and comfortable  Respiratory system: Clear to auscultation. Respiratory effort normal. Cardiovascular system: normal S1 & S2 heard. No JVD, murmurs, rubs, gallops or clicks. No pedal edema. Gastrointestinal system: Abdomen is nondistended, soft and nontender. No organomegaly or masses felt. Normal bowel sounds heard. Central nervous system: Alert and oriented. No focal neurological deficits. Extremities: right femur fracture  Skin: No rashes, lesions or ulcers Psychiatry: Judgement and insight appear poor. Mood & affect appropriate.    Data Reviewed: I have personally reviewed following labs and imaging studies  CBC: Recent Labs  Lab 05/05/21 1901 05/06/21 0430  WBC 13.0* 10.5  NEUTROABS 8.5*  --   HGB 8.4* 7.7*  HCT 27.7* 25.8*  MCV 93.6 92.8  PLT 283 273    Basic Metabolic Panel: Recent Labs  Lab 05/05/21 1901 05/06/21 0430  NA 137 138  K 4.1 4.4  CL 106 107  CO2 25 22  GLUCOSE 130* 188*  BUN 20 25*  CREATININE 1.00 1.11*  CALCIUM 7.9* 8.0*  MG  --  1.8  PHOS  --  3.8    GFR: Estimated Creatinine Clearance: 23.2 mL/min (A) (by C-G formula based on SCr of 1.11 mg/dL (H)).  Liver Function Tests: Recent Labs  Lab 05/05/21 1901 05/06/21 0430  AST 81* 79*  ALT 81* 87*  ALKPHOS 123 117  BILITOT 0.5 0.9  PROT 5.3* 5.6*  ALBUMIN 3.1* 3.2*    CBG: Recent Labs  Lab 05/06/21 0742  GLUCAP 174*    Recent Results (from the past 240 hour(s))  Resp Panel by RT-PCR (Flu A&B, Covid) Nasopharyngeal Swab     Status: None   Collection Time: 05/05/21 10:13 PM   Specimen: Nasopharyngeal Swab; Nasopharyngeal(NP) swabs in vial transport medium  Result Value Ref Range Status   SARS Coronavirus 2 by RT PCR NEGATIVE NEGATIVE Final    Comment: (NOTE) SARS-CoV-2 target nucleic acids are NOT DETECTED.  The SARS-CoV-2 RNA is generally detectable in upper respiratory specimens during the acute phase of infection. The lowest concentration of SARS-CoV-2 viral copies this assay can detect is 138 copies/mL. A negative result does not preclude SARS-Cov-2 infection and should not be used as the sole basis for treatment or other patient management decisions. A negative result may occur with  improper specimen collection/handling, submission of specimen other than nasopharyngeal swab, presence of viral mutation(s) within the areas targeted by this assay, and inadequate number of viral copies(<138 copies/mL). A negative result must be combined with clinical observations, patient history, and  epidemiological information. The expected result is Negative.  Fact Sheet for Patients:  BloggerCourse.com  Fact Sheet for Healthcare Providers:  SeriousBroker.it  This test is no t yet approved or cleared by the [place] FDA and  has been authorized for detection and/or diagnosis of SARS-CoV-2 by FDA under an Emergency Use Authorization (EUA). This EUA will remain  in effect (meaning this test can be used) for the duration of the COVID-19 declaration under Section 564(b)(1) of the Act, 21 U.S.C.section 360bbb-3(b)(1), unless the authorization is terminated  or revoked sooner.       Influenza A by PCR NEGATIVE NEGATIVE Final   Influenza B by PCR NEGATIVE NEGATIVE Final    Comment: (NOTE) The Xpert Xpress SARS-CoV-2/FLU/RSV plus assay is intended as an aid in the  diagnosis of influenza from Nasopharyngeal swab specimens and should not be used as a sole basis for treatment. Nasal washings and aspirates are unacceptable for Xpert Xpress SARS-CoV-2/FLU/RSV testing.  Fact Sheet for Patients: BloggerCourse.com  Fact Sheet for Healthcare Providers: SeriousBroker.it  This test is not yet approved or cleared by the [place] FDA and has been authorized for detection and/or diagnosis of SARS-CoV-2 by FDA under an Emergency Use Authorization (EUA). This EUA will remain in effect (meaning this test can be used) for the duration of the COVID-19 declaration under Section 564(b)(1) of the Act, 21 U.S.C. section 360bbb-3(b)(1), unless the authorization is terminated or revoked.  Performed at [facility], [address], [place], [place] [address]      Radiology Studies: DG Pelvis 1-2 Views  Result Date: 05/05/2021 CLINICAL DATA:  Fall EXAM: PELVIS - 1-2 VIEW COMPARISON:  01/02/2019 FINDINGS: Right hip replacement in satisfactory position and alignment. No pelvic fracture. Normal  left hip. Atherosclerotic calcification noted. IMPRESSION: Right hip replacement.  Negative for pelvic fracture. Electronically Signed   By: Marlan Palau M.D.   On: 05/05/2021 19:06   CT Head Wo Contrast  Result Date: 05/05/2021 CLINICAL DATA:  Head trauma.  Fall at home. EXAM: CT HEAD WITHOUT CONTRAST TECHNIQUE: Contiguous axial images were obtained from the base of the skull through the vertex without intravenous contrast. COMPARISON:  Head CT and brain MRI 02/15/2021 FINDINGS: Brain: No intracranial hemorrhage, mass effect, or midline shift. Stable degree of atrophy. No hydrocephalus. The basilar cisterns are patent. Advanced chronic small vessel ischemia. No definite CT correlate for the multiple punctate infarcts in the left cerebral hemisphere on prior MRI. No evidence of territorial infarct or acute ischemia. No extra-axial or intracranial fluid collection. Vascular: Punctate calcifications in the left frontal lobe are unchanged from prior exam. There is no hyperdense vessel. Skull: No fracture or focal lesion. Sinuses/Orbits: Paranasal sinuses and mastoid air cells are clear. The visualized orbits are unremarkable. Bilateral lens extraction. Other: None. IMPRESSION: 1. No acute intracranial abnormality. No skull fracture. 2. Stable atrophy and chronic small vessel ischemia. Electronically Signed   By: Narda Rutherford M.D.   On: 05/05/2021 20:22   DG Chest Portable 1 View  Result Date: 05/05/2021 CLINICAL DATA:  Fall EXAM: PORTABLE CHEST 1 VIEW COMPARISON:  02/15/2021 FINDINGS: The heart size and mediastinal contours are within normal limits. Both lungs are clear. The visualized skeletal structures are unremarkable. Atherosclerotic aortic arch. IMPRESSION: No active disease. Electronically Signed   By: Marlan Palau M.D.   On: 05/05/2021 19:29   DG Femur Portable 1 View Right  Result Date: 05/05/2021 CLINICAL DATA:  Fall. EXAM: RIGHT FEMUR PORTABLE 1 VIEW COMPARISON:  12/31/2018 FINDINGS:  Right hip replacement in satisfactory position and alignment. There is an oblique fracture of the mid to distal femur with displacement and mild angulation. Right knee replacement is present. The fracture ends above the prosthesis. IMPRESSION: Fracture mid to distal femur on the right with mild angulation and displacement Right hip and right knee replacement. Electronically Signed   By: Marlan Palau M.D.   On: 05/05/2021 19:05    Scheduled Meds:  [MAR Hold] pantoprazole (PROTONIX) IV  40 mg Intravenous Q24H   Continuous Infusions:  lactated ringers 10 mL/hr at 05/06/21 1549     LOS: 1 day   Time spent: 36 mins   Frances Joynt Laural Benes, MD How to contact the [facility] Attending or Consulting provider 7A - 7P or covering provider during after hours 7P -7A, for  this patient?  Check the care team in [facility] and look for a) attending/consulting [facility] provider listed and b) the [facility] team listed Log into [email or web address] and use 's universal password to access. If you do not have the password, please contact the hospital operator. Locate the [facility] provider you are looking for under [facility] and page to a number that you can be directly reached. If you still have difficulty reaching the provider, please page the [facility] (Director on Call) for the Hospitalists listed on amion for assistance.  05/06/2021, 4:04 PM

## 2021-05-06 NOTE — Op Note (Signed)
Orthopaedic Surgery Operative Note (CSN: [id number] ) Date of Surgery: 05/06/2021  Admit Date: 05/05/2021   Diagnoses: Pre-Op Diagnoses: Right periprosthetic femur fracture  Post-Op Diagnosis: Same  Procedures: CPT 27511-Open reduction internal fixation of right femur fracture  Surgeons : Primary: Roby Lofts, MD  Assistant: Ulyses Southward, PA-C  Location: OR 9   Anesthesia:General  Antibiotics: Clindamycin 900mg  with 1 gm vancomycin powder placed topically  Tourniquet time:None    Estimated Blood Loss:75 mL  Complications:Two skin tears sustained on operative leg during manipulation of the leg   Specimens:* No specimens in log *   Implants: Implant Name Type Inv. Item Serial No. Manufacturer Lot No. LRB No. Used Action  CAP LOCK NCB - Cap CAP LOCK NCB  [employer]IM[employer](ORTH,TRAU,BIO,SG)  Right 10 Implanted  PLATE DISTAL FEMUR 15H 317M RT - [id number] Plate PLATE DISTAL FEMUR 15H 317M RT  [employer](ORTH,TRAU,BIO,SG)  Right 1 Implanted  SCREW 5.0 [id number] - Screw SCREW 5.0 [id number]  [employer]ON(ORTH,TRAU,BIO,SG)  Right 1 Implanted  SCREW NCB 5.0X75MM - Screw SCREW NCB 5.0X75MM  Z[employer]MER [employer]ON(ORTH,TRAU,BIO,SG)  Right 3 Implanted  SCREW NCB 5.0X36MM - [id number] Screw SCREW NCB 5.0X36MM  [employer]ON(ORTH,TRAU,BIO,SG)  Right 2 Implanted  SCREW CORT NCB SELFTAP 5.0X42 - [id number] Screw SCREW CORT NCB SELFTAP 5.0X42  ZIMMER [employer]ON(ORTH,TRAU,BIO,SG)  Right 1 Implanted  SCREW 5.0 [id number] - Screw SCREW 5.0 [id number]  ZIMMER RECON(ORTH,TRAU,BIO,SG) ON TRAY Right 1 Implanted  SCREW UNI 5.0 - Screw SCREW UNI 5.0 [id number]  ZIMMER [employer]ON(ORTH,TRAU,BIO,SG) ON TRAY Right 1 Implanted  SCREW NCB 4.0X36MM - Screw SCREW NCB 4.0X36MM  ZIMMER [employer]ON(ORTH,TRAU,BIO,SG) ON TRAY Right 1 Implanted     Indications for Surgery: 87 year old female who sustained a ground-level fall and a right periprosthetic femur fracture.  Due to the unstable nature of her  injury I recommended proceeding with open reduction internal fixation.  Risks and benefits were discussed with the patient and her daughter.  Risks included but not limited to bleeding, infection, malunion, nonunion, hardware failure, hardware irritation, nerve or blood vessel injury, DVT, even the possibility anesthetic complications.  They agreed to proceed with surgery and consent was obtained.  Operative Findings: 1.  Open reduction internal fixation of right periprosthetic femur fracture using [employer] NCB 15 hole distal femoral locking plate 2.  2 skin tears to the lower extremity from manipulation of the leg due to the delicate nature of the patient's skin  Procedure: The patient was identified in the preoperative holding area. Consent was confirmed with the patient and their family and all questions were answered. The operative extremity was marked after confirmation with the patient. she was then brought back to the operating room by our anesthesia colleagues.  She was placed under general anesthetic and carefully transferred over to a radiolucent flat top table.  A bump was placed under her operative hip.  The right lower extremity was then prepped and draped in usual sterile fashion.  A timeout was performed to verify the patient, the procedure, and the extremity.  Preoperative antibiotics were dosed.  Fluoroscopic imaging was obtained to show the unstable nature of her injury.  The hip and knee were flexed over a triangle.  A lateral approach the distal femur was carried down through skin and subcutaneous tissue.  The IT band was split in line with the incision.  I then mobilized and lifted the vastus lateralis off of the intermuscular septum.  An attempt was made to reduce the fracture with  closed means without performing a formal open reduction.  Unfortunately the fracture fragments were too widely displaced to accept this.  I extended my incision proximally and was able to visualize the  fracture by lifting the vastus up even further.  I then used a reduction tenaculum to reduce the fracture.  The fracture then held provisionally while I slid a submuscular [employer] NCB distal femoral locking plate attached to the targeting arm along the lateral cortex of the femur.  I provisionally placed a 2.0 mm guidewire distally and then placed a percutaneous 3.3 mm drill bit just distal to the hip stem.  I confirmed positioning of the plate and then proceeded to place 5.0 millimeter screws in the distal segment to bring the plate flush to bone.  I then placed a 5.0 millimeter screws in the femoral shaft to bring the plate flush to bone proximally.  In total I placed a 3 bicortical screws below the stem in the proximal segment and a unicortical screw at the top hold the plate.  Locking caps were placed on all of the screws.  I placed a metaphyseal screw distally along with a total of 5 distal interlocking screws.  Locking caps were placed on all of the distal screws.  The targeting arm was removed.  Final fluoroscopic imaging was obtained.  The incisions were copiously irrigated.  A gram of vancomycin powder was placed into the incision.  Layered closure of 0 Vicryl, 2-0 Vicryl 3-0 Monocryl and Dermabond was used for the skin.  There was some skin tears to the lower extremity during manipulation of the leg.  I I repaired these back down with a 3-0 Monocryl suture.  Sterile dressings were applied to the lower extremity.  She was then awoken from anesthesia and taken to the PACU in stable condition.  Post Op Plan/Instructions: Patient will be weightbearing as tolerated to the right lower extremity.  She will receive postoperative clindamycin.  She will receive aspirin and Plavix for DVT prophylaxis pending her postoperative hemoglobin.  We will have her mobilize with physical and Occupational Therapy.  I was present and performed the entire surgery.  Ulyses Southward, PA-C did assist me throughout the  case. An assistant was necessary given the difficulty in approach, maintenance of reduction and ability to instrument the fracture.   Truitt Merle, MD [facility]

## 2021-05-06 NOTE — Anesthesia Preprocedure Evaluation (Addendum)
Anesthesia Evaluation  Patient identified by MRN, date of birth, ID band Patient awake    Reviewed: Allergy & Precautions, NPO status , Patient's Chart, lab work & pertinent test results  Airway Mallampati: II  TM Distance: >3 FB Neck ROM: Full    Dental  (+) Edentulous Upper, Edentulous Lower   Pulmonary COPD,  COPD inhaler, former smoker,  symbicort used this AM 60 pack year history    Pulmonary exam normal breath sounds clear to auscultation       Cardiovascular hypertension, Pt. on medications +CHF (grade 1 diastolic dysfunction)  Normal cardiovascular exam Rhythm:Regular Rate:Normal  Echo 2019: - Left ventricle: The cavity size was normal. Wall thickness was  increased in a pattern of mild LVH. Systolic function was normal.  The estimated ejection fraction was in the range of 60% to 65%.  Wall motion was normal; there were no regional wall motion  abnormalities. Doppler parameters are consistent with abnormal  left ventricular relaxation (grade 1 diastolic dysfunction).  Indeterminate filling pressures.  - Atrial septum: No defect or patent foramen ovale was identified.    Neuro/Psych  Headaches, PSYCHIATRIC DISORDERS Anxiety CVA (Jan 2022, March 2022- plavix, ASA (plavix LD yesterday), RLE residual weakness), Residual Symptoms    GI/Hepatic Neg liver ROS, GERD  Controlled,  Endo/Other  negative endocrine ROS  Renal/GU Renal InsufficiencyRenal diseaseCr 1.11  negative genitourinary   Musculoskeletal  (+) Arthritis , Osteoarthritis,    Abdominal   Peds negative pediatric ROS (+)  Hematology  (+) Blood dyscrasia, anemia , Hb 7.7/25.8 (from 8.4 yesterday)- per daughter this is new; no source of bleeding identified    Anesthesia Other Findings   Reproductive/Obstetrics negative OB ROS                            Anesthesia Physical Anesthesia Plan  ASA: 3  Anesthesia  Plan: General   Post-op Pain Management:    Induction: Intravenous  PONV Risk Score and Plan: 3 and Ondansetron, Dexamethasone and Treatment may vary due to age or medical condition  Airway Management Planned: Oral ETT  Additional Equipment: None  Intra-op Plan:   Post-operative Plan: Extubation in OR  Informed Consent: I have reviewed the patients History and Physical, chart, labs and discussed the procedure including the risks, benefits and alternatives for the proposed anesthesia with the patient or authorized representative who has indicated his/her understanding and acceptance.   Patient has DNR.  Discussed DNR with patient, Continue DNR, Suspend DNR and Discussed DNR with power of attorney.   Dental advisory given and Consent reviewed with POA  Plan Discussed with: CRNA  Anesthesia Plan Comments: (Will suspend DNR, but continue to withhold chest compressions and defibrillation per POA Will likely need blood transfusion given low starting Hb and last dose of plavix yesterday  D/w POA possibility of postoperative cognitive dysfunction given pre-existing memory issues)       Anesthesia Quick Evaluation

## 2021-05-06 NOTE — Consult Note (Signed)
Reason for Consult:Right femur fx Referring Physician: Frankey Shown Time called: 1403 Time at bedside: [address] [name] [name] is an 87 y.o. female.  HPI: [name] [name] was at home and lost her balance and fell. She had immediate right thigh pain and could not get up. She was taken to the ED at [facility] where x-rays showed a periprosthetic femur fx and orthopedic surgery was consulted. Due to the complexity of the repair orthopedic trauma evaluation was requested at [facility]. She was transferred here for definitive care. She c/o localized pain to the thigh. She lives at home with her son and ambulates with the aid of a Rollator.  Past Medical History:  Diagnosis Date   Anxiety    Arthritis    rheumatoid   COPD (chronic obstructive pulmonary disease) (HCC)    Fall    Hypertension    Stroke [facility])     Past Surgical History:  Procedure Laterality Date   APPENDECTOMY     CATARACT EXTRACTION W/PHACO Right 11/27/2013   Procedure: CATARACT EXTRACTION PHACO AND INTRAOCULAR LENS PLACEMENT (IOC);  Surgeon: Loraine Leriche T. Nile Riggs, MD;  Location: [facility] ORS;  Service: Ophthalmology;  Laterality: Right;  CDE:7.31   CATARACT EXTRACTION W/PHACO Left 12/11/2013   Procedure: CATARACT EXTRACTION PHACO AND INTRAOCULAR LENS PLACEMENT (IOC);  Surgeon: Loraine Leriche T. Nile Riggs, MD;  Location: [facility];  Service: Ophthalmology;  Laterality: Left;  CDE:7.94   HIP ARTHROPLASTY Right 01/02/2019   Procedure: ARTHROPLASTY BIPOLAR HIP (HEMIARTHROPLASTY);  Surgeon: Durene Romans, MD;  Location: [facility];  Service: Orthopedics;  Laterality: Right;   HUMERUS FRACTURE SURGERY Left    had surgery x3 on it.   REPLACEMENT TOTAL KNEE BILATERAL      Family History  Problem Relation Age of Onset   Hypertension Other     Social History:  reports that she has quit smoking. Her smoking use included cigarettes. She has a 60.00 pack-year smoking history. She quit smokeless tobacco use about 23 years ago. She reports current alcohol use. She reports that she does  not use drugs.  Allergies:  Allergies  Allergen Reactions   Penicillins Swelling, Anaphylaxis, Hives and Nausea And Vomiting    Has patient had a PCN reaction causing immediate rash, facial/tongue/throat swelling, SOB or lightheadedness with hypotension: Yes Has patient had a PCN reaction causing severe rash involving mucus membranes or skin necrosis: No Has patient had a PCN reaction that required hospitalization No Has patient had a PCN reaction occurring within the last 10 years: No If all of the above answers are "NO", then may proceed with Cephalosporin use.  Has patient had a PCN reaction causing immediate rash, facial/tongue/throat swelling, SOB or lightheadedness with hypotension: Yes Has patient had a PCN reaction causing severe rash involving mucus membranes or skin necrosis: No Has patient had a PCN reaction that required hospitalization No Has patient had a PCN reaction occurring within the last 10 years: No If all of the above answers are "NO", then may proceed with Cephalosporin use.    Sulfa Antibiotics Hives   Cortisone Other (See Comments)    Irregular Heart Beat   Morphine And Related Nausea And Vomiting   Vicodin [Hydrocodone-Acetaminophen] Nausea And Vomiting   Motrin [Ibuprofen]     Medications: I have reviewed the patient's current medications.  Results for orders placed or performed during the hospital encounter of 05/05/21 (from the past 48 hour(s))  Comprehensive metabolic panel     Status: Abnormal   Collection Time: 05/05/21  7:01 PM  Result  Value Ref Range   Sodium 137 135 - 145 mmol/L   Potassium 4.1 3.5 - 5.1 mmol/L   Chloride 106 98 - 111 mmol/L   CO2 25 22 - 32 mmol/L   Glucose, Bld 130 (H) 70 - 99 mg/dL    Comment: Glucose reference range applies only to samples taken after fasting for at least 8 hours.   BUN 20 8 - 23 mg/dL   Creatinine, Ser 2.69 0.44 - 1.00 mg/dL   Calcium 7.9 (L) 8.9 - 10.3 mg/dL   Total Protein 5.3 (L) 6.5 - 8.1 g/dL    Albumin 3.1 (L) 3.5 - 5.0 g/dL   AST 81 (H) 15 - 41 U/L   ALT 81 (H) 0 - 44 U/L   Alkaline Phosphatase 123 38 - 126 U/L   Total Bilirubin 0.5 0.3 - 1.2 mg/dL   GFR, Estimated 55 (L) >60 mL/min    Comment: (NOTE) Calculated using the CKD-EPI Creatinine Equation (2021)    Anion gap 6 5 - 15    Comment: Performed at [facility], [address], [place], [place] [address]  CBC with Differential     Status: Abnormal   Collection Time: 05/05/21  7:01 PM  Result Value Ref Range   WBC 13.0 (H) 4.0 - 10.5 K/uL   RBC 2.96 (L) 3.87 - 5.11 MIL/uL   Hemoglobin 8.4 (L) 12.0 - 15.0 g/dL   HCT 27.0 (L) 35.0 - 09.3 %   MCV 93.6 80.0 - 100.0 fL   MCH 28.4 26.0 - 34.0 pg   MCHC 30.3 30.0 - 36.0 g/dL   RDW 81.8 (H) 29.9 - 37.1 %   Platelets 283 150 - 400 K/uL   nRBC 0.0 0.0 - 0.2 %   Neutrophils Relative % 64 %   Neutro Abs 8.5 (H) 1.7 - 7.7 K/uL   Lymphocytes Relative 19 %   Lymphs Abs 2.5 0.7 - 4.0 K/uL   Monocytes Relative 11 %   Monocytes Absolute 1.5 (H) 0.1 - 1.0 K/uL   Eosinophils Relative 4 %   Eosinophils Absolute 0.5 0.0 - 0.5 K/uL   Basophils Relative 1 %   Basophils Absolute 0.1 0.0 - 0.1 K/uL   Immature Granulocytes 1 %   Abs Immature Granulocytes 0.06 0.00 - 0.07 K/uL    Comment: Performed at [facility], [address], [place], [place] [address]  Protime-INR     Status: None   Collection Time: 05/05/21  7:01 PM  Result Value Ref Range   Prothrombin Time 13.5 11.4 - 15.2 seconds   INR 1.0 0.8 - 1.2    Comment: (NOTE) INR goal varies based on device and disease states. Performed at [facility], [address], [place], [place] [address]   Resp Panel by RT-PCR (Flu A&B, Covid) Nasopharyngeal Swab     Status: None   Collection Time: 05/05/21 10:13 PM   Specimen: Nasopharyngeal Swab; Nasopharyngeal(NP) swabs in vial transport medium  Result Value Ref Range   SARS Coronavirus 2 by RT PCR NEGATIVE NEGATIVE    Comment: (NOTE) SARS-CoV-2 target nucleic acids are NOT  DETECTED.  The SARS-CoV-2 RNA is generally detectable in upper respiratory specimens during the acute phase of infection. The lowest concentration of SARS-CoV-2 viral copies this assay can detect is 138 copies/mL. A negative result does not preclude SARS-Cov-2 infection and should not be used as the sole basis for treatment or other patient management decisions. A negative result may occur with  improper specimen collection/handling, submission of specimen  other than nasopharyngeal swab, presence of viral mutation(s) within the areas targeted by this assay, and inadequate number of viral copies(<138 copies/mL). A negative result must be combined with clinical observations, patient history, and epidemiological information. The expected result is Negative.  Fact Sheet for Patients:  BloggerCourse.com  Fact Sheet for Healthcare Providers:  SeriousBroker.it  This test is no t yet approved or cleared by the [place] FDA and  has been authorized for detection and/or diagnosis of SARS-CoV-2 by FDA under an Emergency Use Authorization (EUA). This EUA will remain  in effect (meaning this test can be used) for the duration of the COVID-19 declaration under Section 564(b)(1) of the Act, 21 U.S.C.section 360bbb-3(b)(1), unless the authorization is terminated  or revoked sooner.       Influenza A by PCR NEGATIVE NEGATIVE   Influenza B by PCR NEGATIVE NEGATIVE    Comment: (NOTE) The Xpert Xpress SARS-CoV-2/FLU/RSV plus assay is intended as an aid in the diagnosis of influenza from Nasopharyngeal swab specimens and should not be used as a sole basis for treatment. Nasal washings and aspirates are unacceptable for Xpert Xpress SARS-CoV-2/FLU/RSV testing.  Fact Sheet for Patients: BloggerCourse.com  Fact Sheet for Healthcare Providers: SeriousBroker.it  This test is not yet approved or  cleared by the [place] FDA and has been authorized for detection and/or diagnosis of SARS-CoV-2 by FDA under an Emergency Use Authorization (EUA). This EUA will remain in effect (meaning this test can be used) for the duration of the COVID-19 declaration under Section 564(b)(1) of the Act, 21 U.S.C. section 360bbb-3(b)(1), unless the authorization is terminated or revoked.  Performed at [facility], [address], [place], [place] [address]   Comprehensive metabolic panel     Status: Abnormal   Collection Time: 05/06/21  4:30 AM  Result Value Ref Range   Sodium 138 135 - 145 mmol/L   Potassium 4.4 3.5 - 5.1 mmol/L   Chloride 107 98 - 111 mmol/L   CO2 22 22 - 32 mmol/L   Glucose, Bld 188 (H) 70 - 99 mg/dL    Comment: Glucose reference range applies only to samples taken after fasting for at least 8 hours.   BUN 25 (H) 8 - 23 mg/dL   Creatinine, Ser 6.04 (H) 0.44 - 1.00 mg/dL   Calcium 8.0 (L) 8.9 - 10.3 mg/dL   Total Protein 5.6 (L) 6.5 - 8.1 g/dL   Albumin 3.2 (L) 3.5 - 5.0 g/dL   AST 79 (H) 15 - 41 U/L   ALT 87 (H) 0 - 44 U/L   Alkaline Phosphatase 117 38 - 126 U/L   Total Bilirubin 0.9 0.3 - 1.2 mg/dL   GFR, Estimated 48 (L) >60 mL/min    Comment: (NOTE) Calculated using the CKD-EPI Creatinine Equation (2021)    Anion gap 9 5 - 15    Comment: Performed at [facility], [address], [place], [place] [address]  CBC     Status: Abnormal   Collection Time: 05/06/21  4:30 AM  Result Value Ref Range   WBC 10.5 4.0 - 10.5 K/uL   RBC 2.78 (L) 3.87 - 5.11 MIL/uL   Hemoglobin 7.7 (L) 12.0 - 15.0 g/dL   HCT 11.9 (L) 14.7 - 82.9 %   MCV 92.8 80.0 - 100.0 fL   MCH 27.7 26.0 - 34.0 pg   MCHC 29.8 (L) 30.0 - 36.0 g/dL   RDW 56.2 (H) 13.0 - 86.5 %   Platelets 273 150 - 400 K/uL  nRBC 0.0 0.0 - 0.2 %    Comment: Performed at [facility], [address], [place], [place] [address]  Protime-INR     Status: None   Collection Time: 05/06/21  4:30 AM  Result Value Ref Range    Prothrombin Time 13.5 11.4 - 15.2 seconds   INR 1.0 0.8 - 1.2    Comment: (NOTE) INR goal varies based on device and disease states. Performed at [facility], [address] [place], [place] [address]   APTT     Status: None   Collection Time: 05/06/21  4:30 AM  Result Value Ref Range   aPTT 26 24 - 36 seconds    Comment: Performed at [facility], [address] [place], [place] [address]  Magnesium     Status: None   Collection Time: 05/06/21  4:30 AM  Result Value Ref Range   Magnesium 1.8 1.7 - 2.4 mg/dL    Comment: Performed at [facility], [address] [place], [place] [address]  Phosphorus     Status: None   Collection Time: 05/06/21  4:30 AM  Result Value Ref Range   Phosphorus 3.8 2.5 - 4.6 mg/dL    Comment: Performed at [facility], [address], [place], [place] [address]  CBG monitoring, ED     Status: Abnormal   Collection Time: 05/06/21  7:42 AM  Result Value Ref Range   Glucose-Capillary 174 (H) 70 - 99 mg/dL    Comment: Glucose reference range applies only to samples taken after fasting for at least 8 hours.    DG Pelvis 1-2 Views  Result Date: 05/05/2021 CLINICAL DATA:  Fall EXAM: PELVIS - 1-2 VIEW COMPARISON:  01/02/2019 FINDINGS: Right hip replacement in satisfactory position and alignment. No pelvic fracture. Normal left hip. Atherosclerotic calcification noted. IMPRESSION: Right hip replacement.  Negative for pelvic fracture. Electronically Signed   By: Marlan Palau M.D.   On: 05/05/2021 19:06   CT Head Wo Contrast  Result Date: 05/05/2021 CLINICAL DATA:  Head trauma.  Fall at home. EXAM: CT HEAD WITHOUT CONTRAST TECHNIQUE: Contiguous axial images were obtained from the base of the skull through the vertex without intravenous contrast. COMPARISON:  Head CT and brain MRI 02/15/2021 FINDINGS: Brain: No intracranial hemorrhage, mass effect, or midline shift. Stable degree of atrophy. No hydrocephalus. The basilar cisterns are patent. Advanced chronic small  vessel ischemia. No definite CT correlate for the multiple punctate infarcts in the left cerebral hemisphere on prior MRI. No evidence of territorial infarct or acute ischemia. No extra-axial or intracranial fluid collection. Vascular: Punctate calcifications in the left frontal lobe are unchanged from prior exam. There is no hyperdense vessel. Skull: No fracture or focal lesion. Sinuses/Orbits: Paranasal sinuses and mastoid air cells are clear. The visualized orbits are unremarkable. Bilateral lens extraction. Other: None. IMPRESSION: 1. No acute intracranial abnormality. No skull fracture. 2. Stable atrophy and chronic small vessel ischemia. Electronically Signed   By: Narda Rutherford M.D.   On: 05/05/2021 20:22   DG Chest Portable 1 View  Result Date: 05/05/2021 CLINICAL DATA:  Fall EXAM: PORTABLE CHEST 1 VIEW COMPARISON:  02/15/2021 FINDINGS: The heart size and mediastinal contours are within normal limits. Both lungs are clear. The visualized skeletal structures are unremarkable. Atherosclerotic aortic arch. IMPRESSION: No active disease. Electronically Signed   By: Marlan Palau M.D.   On: 05/05/2021 19:29   DG Femur Portable 1 View Right  Result Date: 05/05/2021 CLINICAL DATA:  Fall. EXAM: RIGHT FEMUR PORTABLE 1  VIEW COMPARISON:  12/31/2018 FINDINGS: Right hip replacement in satisfactory position and alignment. There is an oblique fracture of the mid to distal femur with displacement and mild angulation. Right knee replacement is present. The fracture ends above the prosthesis. IMPRESSION: Fracture mid to distal femur on the right with mild angulation and displacement Right hip and right knee replacement. Electronically Signed   By: Marlan Palau M.D.   On: 05/05/2021 19:05    Review of Systems  HENT:  Negative for ear discharge, ear pain, hearing loss and tinnitus.   Eyes:  Negative for photophobia and pain.  Respiratory:  Negative for cough and shortness of breath.   Cardiovascular:   Negative for chest pain.  Gastrointestinal:  Negative for abdominal pain, nausea and vomiting.  Genitourinary:  Negative for dysuria, flank pain, frequency and urgency.  Musculoskeletal:  Positive for arthralgias (Right thigh). Negative for back pain, myalgias and neck pain.  Neurological:  Negative for dizziness and headaches.  Hematological:  Does not bruise/bleed easily.  Psychiatric/Behavioral:  The patient is not nervous/anxious.   Blood pressure (!) 108/93, pulse 80, temperature 98.3 F (36.8 C), temperature source Oral, resp. rate 16, height 5\' 1"  (1.549 m), weight 40.4 kg, SpO2 100 %. Physical Exam Constitutional:      General: She is not in acute distress.    Appearance: She is well-developed. She is not diaphoretic.  HENT:     Head: Normocephalic and atraumatic.  Eyes:     General: No scleral icterus.       Right eye: No discharge.        Left eye: No discharge.     Conjunctiva/sclera: Conjunctivae normal.  Cardiovascular:     Rate and Rhythm: Normal rate and regular rhythm.  Pulmonary:     Effort: Pulmonary effort is normal. No respiratory distress.  Musculoskeletal:     Cervical back: Normal range of motion.     Comments: RLE No traumatic wounds, ecchymosis, or rash  Mod TTP thigh, KI in place  No knee or ankle effusion  Sens DPN, SPN, TN intact  Motor EHL, ext, flex, evers 5/5  DP 2+, PT 1+, No significant edema  Skin:    General: Skin is warm and dry.  Neurological:     Mental Status: She is alert.  Psychiatric:        Mood and Affect: Mood normal.        Behavior: Behavior normal.    Assessment/Plan: Right femur fx -- Plan ORIF today by Dr. Jena Gauss. Please keep NPO. Multiple medical problems including rheumatoid arthritis, oxygen dependent COPD from former smoking, hypertension, prior strokes, falls, and anxiety disorder -- per primary service    Freeman Caldron, PA-C Orthopedic Surgery [phone] 05/06/2021, 2:14 PM

## 2021-05-06 NOTE — ED Notes (Signed)
Pt cleaned, dried and bed linens changed. Pt placed on pure wick.

## 2021-05-06 NOTE — ED Notes (Signed)
Arline Asp will be taking pt clothes and wedding rings home.

## 2021-05-07 ENCOUNTER — Encounter (HOSPITAL_COMMUNITY): Payer: Self-pay | Admitting: Student

## 2021-05-07 LAB — CBC
HCT: 22.3 % — ABNORMAL LOW (ref 36.0–46.0)
Hemoglobin: 7.2 g/dL — ABNORMAL LOW (ref 12.0–15.0)
MCH: 29 pg (ref 26.0–34.0)
MCHC: 32.3 g/dL (ref 30.0–36.0)
MCV: 89.9 fL (ref 80.0–100.0)
Platelets: 165 10*3/uL (ref 150–400)
RBC: 2.48 MIL/uL — ABNORMAL LOW (ref 3.87–5.11)
RDW: 17.9 % — ABNORMAL HIGH (ref 11.5–15.5)
WBC: 6.8 10*3/uL (ref 4.0–10.5)
nRBC: 0 % (ref 0.0–0.2)

## 2021-05-07 LAB — BASIC METABOLIC PANEL
Anion gap: 5 (ref 5–15)
BUN: 27 mg/dL — ABNORMAL HIGH (ref 8–23)
CO2: 22 mmol/L (ref 22–32)
Calcium: 7.6 mg/dL — ABNORMAL LOW (ref 8.9–10.3)
Chloride: 109 mmol/L (ref 98–111)
Creatinine, Ser: 1.33 mg/dL — ABNORMAL HIGH (ref 0.44–1.00)
GFR, Estimated: 39 mL/min — ABNORMAL LOW (ref >60)
Glucose, Bld: 129 mg/dL — ABNORMAL HIGH (ref 70–99)
Potassium: 4.3 mmol/L (ref 3.5–5.1)
Sodium: 136 mmol/L (ref 135–145)

## 2021-05-07 LAB — MAGNESIUM: Magnesium: 1.6 mg/dL — ABNORMAL LOW (ref 1.7–2.4)

## 2021-05-07 LAB — VITAMIN D 25 HYDROXY (VIT D DEFICIENCY, FRACTURES): Vit D, 25-Hydroxy: 109.08 ng/mL — ABNORMAL HIGH (ref 30–100)

## 2021-05-07 MED ORDER — MAGNESIUM SULFATE 2 GM/50ML IV SOLN
2.0000 g | Freq: Once | INTRAVENOUS | Status: AC
  Administered 2021-05-07: 2 g via INTRAVENOUS
  Filled 2021-05-07: qty 50

## 2021-05-07 MED ORDER — ALPRAZOLAM 0.25 MG PO TABS
0.2500 mg | ORAL_TABLET | Freq: Every day | ORAL | Status: DC | PRN
  Administered 2021-05-07 – 2021-05-12 (×6): 0.25 mg via ORAL
  Filled 2021-05-07 (×6): qty 1

## 2021-05-07 MED ORDER — PANTOPRAZOLE SODIUM 40 MG PO TBEC
40.0000 mg | DELAYED_RELEASE_TABLET | Freq: Every day | ORAL | Status: DC
  Administered 2021-05-07 – 2021-05-11 (×5): 40 mg via ORAL
  Filled 2021-05-07 (×5): qty 1

## 2021-05-07 MED ORDER — TRAMADOL HCL 50 MG PO TABS
100.0000 mg | ORAL_TABLET | ORAL | Status: DC | PRN
  Administered 2021-05-07 – 2021-05-09 (×3): 100 mg via ORAL
  Filled 2021-05-07 (×4): qty 2

## 2021-05-07 MED ORDER — ALPRAZOLAM 0.5 MG PO TABS
0.5000 mg | ORAL_TABLET | Freq: Every evening | ORAL | Status: DC | PRN
  Administered 2021-05-08 – 2021-05-11 (×4): 0.5 mg via ORAL
  Filled 2021-05-07 (×4): qty 1

## 2021-05-07 NOTE — Evaluation (Signed)
Physical Therapy Evaluation Patient Details Name: [name] [name] MRN: [id number] DOB: [birth year] Today's Date: 05/07/2021   History of Present Illness  87 y.o. female s/p fall at home with resulting R femur fx. Underwent ORIF R femur 05/06/21. Medical history significant for  rheumatoid arthritis, oxygen dependent COPD (O2 at night), hypertension, prior strokes (01/2021 with residual R sided weakness), R THA,, falls, anxiety disorder.  Clinical Impression  Pt admitted with above diagnosis. Pt normally ambulates with supervision using a RW.  She has support from family and aides at home.  Today, pt tolerated therapy well and progressing well for POD #1.  She required min A for sit to stand but was nervous about pivot or stepping so utilized STEDY.  Good pain control with transfers and exercises.  Pt with good rehab potential.  Pt currently with functional limitations due to the deficits listed below (see PT Problem List). Pt will benefit from skilled PT to increase their independence and safety with mobility to allow discharge to the venue listed below.       Follow Up Recommendations SNF    Equipment Recommendations  Wheelchair cushion (measurements PT);Wheelchair (measurements PT) (further assessment post acute)    Recommendations for Other Services       Precautions / Restrictions Precautions Precautions: Fall Restrictions RLE Weight Bearing: Weight bearing as tolerated      Mobility  Bed Mobility Overal bed mobility: Needs Assistance Bed Mobility: Supine to Sit;Sit to Supine       Sit to supine: Mod assist   General bed mobility comments: Mod A for legs and repositioning; cued for sequence    Transfers Overall transfer level: Needs assistance   Transfers: Sit to/from Stand;Stand Pivot Transfers Sit to Stand: Min assist Stand pivot transfers: Total assist (STEDY)       General transfer comment: Pt reports she was very nervous with transfer earlier - OT reported pt  stood well but had difficulty with pivot.  PT utilized STEDY.  Pt cued for hand placement and was min A to stand with Total A pivot using STEDY.  Ambulation/Gait             General Gait Details: Unable to take steps.  Did work on pre-gait in [employer] - see Scientist, physiological    Modified Rankin (Stroke Patients Only)       Balance Overall balance assessment: History of Falls;Needs assistance Sitting-balance support: No upper extremity supported Sitting balance-Leahy Scale: Good     Standing balance support: Bilateral upper extremity supported Standing balance-Leahy Scale: Poor Standing balance comment: Requiring UE and min A.  Stood in STEDY x 2 with weight shifting and min A.  Worked on trying to lift feet.  Pt able to lift R LE but unable to lift L LE due to pain/fear of weight bearing on R.                             Pertinent Vitals/Pain Pain Assessment: 0-10 Pain Score: 1  Pain Location: r hip Pain Descriptors / Indicators: Discomfort Pain Intervention(s): Limited activity within patient's tolerance;Monitored during session;Repositioned    Home Living Family/patient expects to be discharged to:: Skilled nursing facility                      Prior Function Level of Independence: Needs assistance   Gait / Transfers  Assistance Needed: Pt ambulated with supervision with RW  ADL's / Homemaking Assistance Needed: Assist for bathing, dressing, and IADLs.  Comments: Had PCA from 8-4; Family present at other times     Hand Dominance   Dominant Hand: Right    Extremity/Trunk Assessment   Upper Extremity Assessment Upper Extremity Assessment: Defer to OT evaluation    Lower Extremity Assessment Lower Extremity Assessment: LLE deficits/detail;RLE deficits/detail RLE Deficits / Details: ROM WFL; MMT 5/5 ankld, 3/5 knee, 1/5 hip; expected post op changes LLE Deficits / Details: ROM WFL; MMT 5/5    Cervical /  Trunk Assessment Cervical / Trunk Assessment: Kyphotic  Communication   Communication: No difficulties  Cognition Arousal/Alertness: Awake/alert Behavior During Therapy: Anxious Overall Cognitive Status: Impaired/Different from baseline Area of Impairment: Orientation;Attention;Memory;Following commands;Safety/judgement;Awareness;Problem solving                 Orientation Level: Disoriented to;Place;Time Current Attention Level: Sustained Memory: Decreased recall of precautions;Decreased short-term memory Following Commands: Follows one step commands with increased time Safety/Judgement: Decreased awareness of deficits;Decreased awareness of safety Awareness: Intellectual Problem Solving: Slow processing General Comments: deficits at baseline however more impaired at this time - son reports gradually improving after anesthesia      General Comments General comments (skin integrity, edema, etc.): VSS    Exercises General Exercises - Lower Extremity Ankle Circles/Pumps: AROM;Both;10 reps;Supine Long Arc Quad: AROM;Both;5 reps;Seated Heel Slides: AROM;Both;5 reps;Supine (limited motion on R)   Assessment/Plan    PT Assessment Patient needs continued PT services  PT Problem List Decreased strength;Decreased safety awareness;Decreased mobility;Decreased range of motion;Decreased activity tolerance;Decreased balance;Decreased knowledge of use of DME;Pain;Decreased cognition       PT Treatment Interventions DME instruction;Therapeutic activities;Gait training;Therapeutic exercise;Patient/family education;Balance training;Functional mobility training;Modalities    PT Goals (Current goals can be found in the Care Plan section)  Acute Rehab PT Goals Patient Stated Goal: per family to get rehab at [facility] PT Goal Formulation: With patient/family Time For Goal Achievement: 05/21/21 Potential to Achieve Goals: Good    Frequency Min 3X/week   Barriers to discharge         Co-evaluation               AM-PAC PT "6 Clicks" Mobility  Outcome Measure Help needed turning from your back to your side while in a flat bed without using bedrails?: A Little Help needed moving from lying on your back to sitting on the side of a flat bed without using bedrails?: A Lot Help needed moving to and from a bed to a chair (including a wheelchair)?: Total Help needed standing up from a chair using your arms (e.g., wheelchair or bedside chair)?: A Little Help needed to walk in hospital room?: Total Help needed climbing 3-5 steps with a railing? : Total 6 Click Score: 11    End of Session Equipment Utilized During Treatment: Gait belt Activity Tolerance: Patient tolerated treatment well Patient left: in bed;with call bell/phone within reach;with SCD's reapplied;with bed alarm set;with family/visitor present Nurse Communication: Mobility status PT Visit Diagnosis: Unsteadiness on feet (R26.81);History of falling (Z91.81);Muscle weakness (generalized) (M62.81)    Time: 4481-8563 PT Time Calculation (min) (ACUTE ONLY): 20 min   Charges:   PT Evaluation $PT Eval Moderate Complexity: 1 Andi Hence, PT Acute Rehab Services Pager [phone] [facility] [phone]   Rayetta Humphrey 05/07/2021, 1:51 PM

## 2021-05-07 NOTE — Plan of Care (Signed)
  Problem: Education: Goal: Knowledge of General Education information will improve Description: Including pain rating scale, medication(s)/side effects and non-pharmacologic comfort measures Outcome: Progressing   Problem: Pain Managment: Goal: General experience of comfort will improve Outcome: Progressing   Problem: Skin Integrity: Goal: Risk for impaired skin integrity will decrease Outcome: Progressing   

## 2021-05-07 NOTE — Progress Notes (Signed)
Orthopaedic Trauma Progress Note  SUBJECTIVE: Reports mild pain about operative site, overall pain controlled on current regimen. No chest pain. No SOB. No nausea/vomiting. Feels like the dressing on her lower leg is wet. No other complaints.   OBJECTIVE:  Vitals:   05/07/21 0016 05/07/21 0452  BP: 121/65 (!) 141/96  Pulse: 95 91  Resp: 16 15  Temp: (!) 97.4 F (36.3 C) 98.2 F (36.8 C)  SpO2: 100% 99%    General: Laying in bed, NAD Respiratory: No increased work of breathing.  Right lower extremity: Dressing over lower leg with serosanguinous drainage, this was changed. Remainder of dressing is clean, dry, intact.  Mild tenderness throughout the thigh as expected.ankle DF/PF intact. Endorses sensation to light touch. +DP pulse  IMAGING: Stable post op imaging.   LABS:  Results for orders placed or performed during the hospital encounter of 05/05/21 (from the past 24 hour(s))  Type and screen [facility]     Status: None (Preliminary result)   Collection Time: 05/06/21  3:53 PM  Result Value Ref Range   ABO/RH(D) O NEG    Antibody Screen NEG    Sample Expiration 05/09/2021,2359    Unit Number [id number]    Blood Component Type RED CELLS,LR    Unit division 00    Status of Unit ALLOCATED    Transfusion Status OK TO TRANSFUSE    Crossmatch Result      Compatible Performed at [facility], [address], [place], [place] [address]    Unit Number [id number]    Blood Component Type RBC LR PHER2    Unit division 00    Status of Unit ISSUED    Transfusion Status OK TO TRANSFUSE    Crossmatch Result Compatible   Prepare RBC (crossmatch)     Status: None   Collection Time: 05/06/21  4:23 PM  Result Value Ref Range   Order Confirmation      ORDER PROCESSED BY BLOOD BANK Performed at [facility], [address] [place], [place] [address]   Basic metabolic panel     Status: Abnormal   Collection Time: 05/07/21  4:38 AM  Result Value Ref  Range   Sodium 136 135 - 145 mmol/L   Potassium 4.3 3.5 - 5.1 mmol/L   Chloride 109 98 - 111 mmol/L   CO2 22 22 - 32 mmol/L   Glucose, Bld 129 (H) 70 - 99 mg/dL   BUN 27 (H) 8 - 23 mg/dL   Creatinine, Ser 2.68 (H) 0.44 - 1.00 mg/dL   Calcium 7.6 (L) 8.9 - 10.3 mg/dL   GFR, Estimated 39 (L) >60 mL/min   Anion gap 5 5 - 15  CBC     Status: Abnormal   Collection Time: 05/07/21  4:38 AM  Result Value Ref Range   WBC 6.8 4.0 - 10.5 K/uL   RBC 2.48 (L) 3.87 - 5.11 MIL/uL   Hemoglobin 7.2 (L) 12.0 - 15.0 g/dL   HCT 34.1 (L) 96.2 - 22.9 %   MCV 89.9 80.0 - 100.0 fL   MCH 29.0 26.0 - 34.0 pg   MCHC 32.3 30.0 - 36.0 g/dL   RDW 79.8 (H) 92.1 - 19.4 %   Platelets 165 150 - 400 K/uL   nRBC 0.0 0.0 - 0.2 %  Magnesium     Status: Abnormal   Collection Time: 05/07/21  4:38 AM  Result Value Ref Range   Magnesium 1.6 (L) 1.7 - 2.4 mg/dL  ASSESSMENT: [name] [name] is a 87 y.o. female, 1 Day Post-Op  s/p OPEN REDUCTION INTERNAL FIXATION RIGHT DISTAL FEMUR FRACTURE  CV/Blood loss: Acute blood loss anemia, Hgb 7.2 this morning.  Received 2 units PRBCs perioperatively.  PLAN: Weightbearing: WBAT RLE Incisional and dressing care: Reinforce dressings as needed  Showering: Hold off on showering for now.  Okay for bed bath Orthopedic device(s): None  Pain management:  1. Tylenol 1000 mg q 8 hours scheduled 2. Fentanyl 25 mcg q 2 hours PRN 3. Tramadol 50 mg q 6 hours PRN VTE prophylaxis: Aspirin and Plavix , SCDs ID: Clindamycin postop Foley/Lines:  No foley, KVO IVFs Impediments to Fracture Healing: Vitamin D level pending, will adjust vitamin D supplementation as indicated Dispo: PT/OT evaluation today, dispo pending.  Will likely require SNF.   Follow - up plan: 2 weeks after discharge for repeat x-rays and wound check  Contact information:  Truitt Merle MD, Ulyses Southward PA-C. After hours and holidays please check [email or web address] for group call information for [facility]   Krithik Mapel A.  Michaelyn Barter, PA-C [phone] (office) [email or web address]

## 2021-05-07 NOTE — NC FL2 (Signed)
[place] MEDICAID FL2 LEVEL OF CARE SCREENING TOOL     IDENTIFICATION  Patient Name: [name] [name] Birthdate: 03-20-1932 Sex: female Admission Date (Current Location): 05/05/2021  [facility] [place] Number:  Producer, television/film/video and Address:  [facility], [address] [place], [place] [address]      Provider Number: [id number]  Attending Physician Name and Address:  Darlin Priestly, MD  Relative Name and Phone Number:  Morris,Cindy (Daughter)   [phone]    Current Level of Care: Hospital Recommended Level of Care: Skilled Nursing Facility Prior Approval Number:    Date Approved/Denied:   PASRR Number:    Discharge Plan: SNF    Current Diagnoses: Patient Active Problem List   Diagnosis Date Noted   Right femoral fracture (HCC) 05/05/2021   Hyponatremia 05/05/2021   Dehydration 05/05/2021   Hypoalbuminemia 05/05/2021   History of stroke 05/05/2021   GERD (gastroesophageal reflux disease) 05/05/2021   Acute CVA (cerebrovascular accident) (HCC) 02/15/2021   Fall at home 02/15/2021   Former smoker    Postoperative fever 01/03/2019   Hypokalemia 01/03/2019   History of rheumatoid arthritis 01/03/2019   Closed right hip fracture, initial encounter (HCC) 12/31/2018   Hypertension 06/27/2018   COPD (chronic obstructive pulmonary disease) (HCC) 06/27/2018   Anxiety 06/27/2018   Headache 06/27/2018    Orientation RESPIRATION BLADDER Height & Weight     Self  Normal Incontinent Weight: 89 lb (40.4 kg) Height:  5\' 1"  (154.9 cm)  BEHAVIORAL SYMPTOMS/MOOD NEUROLOGICAL BOWEL NUTRITION STATUS      Continent Diet (see d/c summary)  AMBULATORY STATUS COMMUNICATION OF NEEDS Skin   Limited Assist   Surgical wounds                       Personal Care Assistance Level of Assistance  Feeding, Bathing, Dressing Bathing Assistance: Limited assistance Feeding assistance: Independent Dressing Assistance: Limited assistance     Functional  Limitations Info  Sight, Speech, Hearing Sight Info: Adequate Hearing Info: Adequate Speech Info: Adequate    SPECIAL CARE FACTORS FREQUENCY                       Contractures Contractures Info: Not present    Additional Factors Info  Allergies, Code Status Code Status Info: DNR Allergies Info: Penicillins   Sulfa Antibiotics   Cortisone   Morphine And Related   Vicodin (Hydrocodone-acetaminophen)   Motrin (Ibuprofen           Current Medications (05/07/2021):  This is the current hospital active medication list Current Facility-Administered Medications  Medication Dose Route Frequency Provider Last Rate Last Admin   acetaminophen (TYLENOL) tablet 1,000 mg  1,000 mg Oral Q8H 05/09/2021 A, PA-C   1,000 mg at 05/07/21 1344   albuterol (VENTOLIN HFA) 108 (90 Base) MCG/ACT inhaler 2 puff  2 puff Inhalation Q6H PRN 05/09/21, PA-C       ALPRAZolam Despina Hidden) tablet 0.25 mg  0.25 mg Oral Daily PRN Prudy Feeler, MD   0.25 mg at 05/07/21 0940   ALPRAZolam 05/09/21) tablet 0.5 mg  0.5 mg Oral QHS PRN Prudy Feeler, MD       amLODipine (NORVASC) tablet 5 mg  5 mg Oral Daily Darlin Priestly A, PA-C   5 mg at 05/07/21 05/09/21   aspirin EC tablet 81 mg  81 mg Oral Daily 3329, PA-C   81 mg at 05/07/21 [phone]  atorvastatin (LIPITOR) tablet 20 mg  20 mg Oral QPM Despina Hidden, PA-C   20 mg at 05/06/21 2208   cholecalciferol (VITAMIN D3) tablet 1,000 Units  1,000 Units Oral Daily Despina Hidden, PA-C   1,000 Units at 05/07/21 6314   clopidogrel (PLAVIX) tablet 75 mg  75 mg Oral Daily Darlin Priestly, MD   75 mg at 05/07/21 9702   docusate sodium (COLACE) capsule 100 mg  100 mg Oral BID Despina Hidden, PA-C   100 mg at 05/07/21 6378   fentaNYL (SUBLIMAZE) injection 25 mcg  25 mcg Intravenous Q2H PRN Despina Hidden, PA-C       ferrous sulfate tablet 325 mg  325 mg Oral TID PC Despina Hidden, PA-C   325 mg at 05/07/21 1344   ondansetron (ZOFRAN) injection 4 mg  4 mg Intravenous Q6H PRN  Despina Hidden, PA-C   4 mg at 05/06/21 1214   pantoprazole (PROTONIX) EC tablet 40 mg  40 mg Oral QHS Darlin Priestly, MD       polyethylene glycol (MIRALAX / GLYCOLAX) packet 17 g  17 g Oral Daily PRN Ulyses Southward A, PA-C       prochlorperazine (COMPAZINE) injection 10 mg  10 mg Intravenous Q6H PRN Ulyses Southward A, PA-C   10 mg at 05/06/21 5885   sodium chloride flush (NS) 0.9 % injection 10-40 mL  10-40 mL Intracatheter Q12H Darlin Priestly, MD   10 mL at 05/06/21 2300   sodium chloride flush (NS) 0.9 % injection 10-40 mL  10-40 mL Intracatheter PRN Darlin Priestly, MD       traMADol Janean Sark) tablet 100 mg  100 mg Oral Q4H PRN Darlin Priestly, MD   100 mg at 05/07/21 1514     Discharge Medications: Please see discharge summary for a list of discharge medications.  Relevant Imaging Results:  Relevant Lab Results:   Additional Information ssn:[id number]  Covid Vaccines :Pfizer COVID-19 Vaccine 10/16/2020 , 01/18/2020 , 12/24/2019  Ralene Bathe, LCSWA

## 2021-05-07 NOTE — Progress Notes (Signed)
Occupational Therapy Evaluation Patient Details Name: [name] [name] MRN: [id number] DOB: 1932/02/09 Today's Date: 05/07/2021    History of Present Illness 87 y.o. female s/p fall at home with resulting R femur fx. Underwent ORIF R femur 05/06/21. Medical history significant for  rheumatoid arthritis, oxygen dependent COPD (O2 at night), hypertension, prior strokes (01/2021 with residual R sided weakness), R THA,, falls, anxiety disorder.   Clinical Impression   PTA pt lives at home with son and has PCA with her during the day when family is not home. At baseline, pt mobilizes @ RW level with close S and family/caregiver assist with ADL as needed. Pt had just finished HHOT/PT @ 3 wks PTA. Pt moved well to EOB,  however required +2 Max A to stand step pivot to chair. VSS on RA with max RH in 130s. Daughter asking if pt had her anxiety medicine which she has "taken for decades". Of note, pt's husband recently passed away in 01/17/2021. Recommend rehab at SNF. Supportive daughter present during evaluation and requests rehab at [facility] if possible. SW notified. Will follow acutely.     Follow Up Recommendations  SNF    Equipment Recommendations  None recommended by OT    Recommendations for Other Services       Precautions / Restrictions Precautions Precautions: Fall Restrictions RLE Weight Bearing: Weight bearing as tolerated      Mobility Bed Mobility Overal bed mobility: Needs Assistance Bed Mobility: Supine to Sit     Supine to sit: Mod assist          Transfers Overall transfer level: Needs assistance Equipment used: Rolling walker (2 wheeled) Transfers: Sit to/from [employer] Sit to Stand: Min assist;+2 physical assistance Stand pivot transfers: Max assist;+2 physical assistance (L knee buckling; R knee buckling; R foot inverts with foot drop - baseilne - would benefit form eval for custom AFO)       General transfer comment: poor orientation in  RW    Balance Overall balance assessment: History of Falls                                         ADL either performed or assessed with clinical judgement   ADL Overall ADL's : Needs assistance/impaired   Eating/Feeding Details (indicate cue type and reason): family reports "some dysphasia" however no chaoking on thin liquids Grooming: Set up;Sitting   Upper Body Bathing: Set up;Sitting;Supervision/ safety   Lower Body Bathing: Maximal assistance;Sit to/from stand   Upper Body Dressing : Minimal assistance;Sitting   Lower Body Dressing: Maximal assistance;Sit to/from stand   Toilet Transfer: Maximal assistance;+2 for physical assistance   Toileting- Clothing Manipulation and Hygiene: Total assistance       Functional mobility during ADLs: Maximal assistance;+2 for physical assistance;Cueing for safety;Cueing for sequencing;Rolling walker       Vision         Perception     Praxis      Pertinent Vitals/Pain Pain Assessment: Faces Faces Pain Scale: Hurts little more Pain Location: r hip Pain Descriptors / Indicators: Aching;Discomfort;Grimacing Pain Intervention(s): Limited activity within patient's tolerance;Repositioned;Patient requesting pain meds-RN notified;RN gave pain meds during session     Hand Dominance     Extremity/Trunk Assessment Upper Extremity Assessment Upper Extremity Assessment: Generalized weakness (RUE weaker however functional)   Lower Extremity Assessment Lower Extremity Assessment: Defer to PT evaluation (inverts foot; toe  walks)   Cervical / Trunk Assessment Cervical / Trunk Assessment: Kyphotic   Communication Communication Communication: No difficulties   Cognition Arousal/Alertness: Awake/alert Behavior During Therapy: Anxious Overall Cognitive Status: Impaired/Different from baseline Area of Impairment: Orientation;Attention;Memory;Following commands;Safety/judgement;Awareness;Problem solving                  Orientation Level: Disoriented to;Place;Time Current Attention Level: Sustained Memory: Decreased recall of precautions;Decreased short-term memory Following Commands: Follows one step commands with increased time Safety/Judgement: Decreased awareness of deficits;Decreased awareness of safety Awareness: Intellectual Problem Solving: Slow processing General Comments: deficits at baseline however more impaired at this time   General Comments       Exercises Exercises: Other exercises Other Exercises Other Exercises: incentive spriometer x 7 - able to pull 500 ml   Shoulder Instructions      Home Living Family/patient expects to be discharged to:: Skilled nursing facility                                        Prior Functioning/Environment Level of Independence: Needs assistance  Gait / Transfers Assistance Needed: S with RW ADL's / Homemaking Assistance Needed: Assist for bathing, dressing, and IADLs.   Comments: Had PCA from 8-4; Family present at other times        OT Problem List: Decreased strength;Decreased range of motion;Decreased activity tolerance;Impaired balance (sitting and/or standing);Decreased cognition;Decreased safety awareness;Decreased knowledge of use of DME or AE;Decreased knowledge of precautions;Cardiopulmonary status limiting activity;Impaired tone;Pain      OT Treatment/Interventions: Self-care/ADL training;Therapeutic exercise;DME and/or AE instruction;Therapeutic activities;Cognitive remediation/compensation;Patient/family education;Balance training    OT Goals(Current goals can be found in the care plan section) Acute Rehab OT Goals Patient Stated Goal: per family to get rehab at [facility] OT Goal Formulation: With patient/family Time For Goal Achievement: 05/21/21 Potential to Achieve Goals: Good  OT Frequency: Min 2X/week   Barriers to D/C:            Co-evaluation              AM-PAC OT "6 Clicks"  Daily Activity     Outcome Measure Help from another person eating meals?: None Help from another person taking care of personal grooming?: A Little Help from another person toileting, which includes using toliet, bedpan, or urinal?: Total Help from another person bathing (including washing, rinsing, drying)?: A Lot Help from another person to put on and taking off regular upper body clothing?: A Little Help from another person to put on and taking off regular lower body clothing?: A Lot 6 Click Score: 15   End of Session Equipment Utilized During Treatment: Gait belt;Rolling walker Nurse Communication: Mobility status;Weight bearing status;Precautions  Activity Tolerance: Patient tolerated treatment well Patient left: in chair;with call bell/phone within reach;with chair alarm set;with family/visitor present  OT Visit Diagnosis: Unsteadiness on feet (R26.81);Other abnormalities of gait and mobility (R26.89);Muscle weakness (generalized) (M62.81);History of falling (Z91.81);Other symptoms and signs involving cognitive function;Pain Pain - Right/Left: Right Pain - part of body: Hip                Time: 0938-1829 OT Time Calculation (min): 29 min Charges:  OT General Charges $OT Visit: 1 Visit OT Evaluation $OT Eval Moderate Complexity: 1 Mod OT Treatments $Self Care/Home Management : 8-22 mins  Luisa Dago, OT/L   Acute OT Clinical Specialist Acute Rehabilitation Services Pager [phone] Office [phone]   [facility] 05/07/2021,  9:59 AM

## 2021-05-07 NOTE — TOC CAGE-AID Note (Addendum)
Transition of Care [facility]) - CAGE-AID Screening   Patient Details  Name: [name] [name] MRN: [id number] Date of Birth: 1932-01-14  Transition of [facility]) CM/SW Contact:    Katha Hamming, RN Phone Number: [phone] 05/07/2021, 2:18 PM   Clinical Narrative:  Patient presents to hospital after a fall at home resulting in femur fracture. CAGE AID completed, patient states she has a history of drinking "a couple of drinks" alcohol daily with friends before dinner. Denies drug use. Denies need to cut down on ETOH/resources.   CAGE-AID Screening:    Have You Ever Felt You Ought to Cut Down on Your Drinking or Drug Use?: No Have People Annoyed You By Critizing Your Drinking Or Drug Use?: No Have You Felt Bad Or Guilty About Your Drinking Or Drug Use?: No Have You Ever Had a Drink or Used Drugs First Thing In The Morning to Steady Your Nerves or to Get Rid of a Hangover?: No CAGE-AID Score: 0  Substance Abuse Education Offered: Yes  Substance abuse interventions: Other (must comment) (declined)

## 2021-05-07 NOTE — Progress Notes (Signed)
PROGRESS NOTE   KHIYA [name]  DTO:[id number] DOB: 1931-12-09 DOA: 05/05/2021 PCP: Richmond Campbell., PA-C   Chief Complaint  Patient presents with   Leg Pain   Level of care: Med-Surg  Brief Admission History:  87 y.o. female with medical history significant for  rheumatoid arthritis, oxygen dependent COPD from former smoking, hypertension, prior strokes, falls, anxiety disorder who presents to the emergency department after sustaining a fall at home.  Patient states that she was trying to step out of the doorway onto the deck of her house without using walker and she sustained a fall with difficulty in being able to bear weight on the right leg due to right thigh/hip pain.  Patient denies any head injury, chest pain, shortness of breath, numbness or tingling in the extremities.  Orthopedic surgeon on call was consulted and he made an arrangement for patient to be admitted to [facility] per ED PA.  Hospitalist was asked to admit patient for further evaluation and management.  Assessment & Plan:   Principal Problem:   Right femoral fracture (HCC) Active Problems:   Hypertension   COPD (chronic obstructive pulmonary disease) (HCC)   Anxiety   History of rheumatoid arthritis   Fall at home   Hyponatremia   Dehydration   Hypoalbuminemia   History of stroke   GERD (gastroesophageal reflux disease)   Acute right femoral fracture from fall at home   S/p ORIF on 05/06/21 - Pt transferred to [facility] for operative management with Dr. Jena Gauss  Plan: --tramadol 100 mg q4h PRN for pain (only opioid pt would take) --cont tylenol 1g q8h --per ortho, VTE prophylaxis: Aspirin and Plavix , SCDs  Hyponatremia, resolved --d/c MIVF  Hypoalbuminemia - supplements as ordered, consult to dietitian per fracture protocol.   History of CVA  - cont asa and plavix  RA  - has been stable per daughter, resume home meds when able.   Essential hypertension  - holding BP meds due to intermittent soft  BP  Anxiety --resume home Xanax BID PRN  GERD  --cont PPI  Anemia --ordered 2u pRBC, 1u transfused --transfuse 2nd unit today for borderline Hgb   DVT prophylaxis: SCDs Code Status: DNR  Family Communication:   Disposition: anticipating SNF  Status is: Inpatient  Not inpatient appropriate, will call UM team and downgrade to OBS.   Dispo: The patient is from: Home              Anticipated d/c is to: SNF              Patient currently is not medically stable to d/c.   Difficult to place patient No  Consultants:  Orthopedics Dr. Jena Gauss  Procedures:  Pending   Antimicrobials:  N/a   Subjective: Pt reported pain uncontrolled, but said she could only take Tramadol.  Dose and frequency of Tramadol therefore increased.  Pt reported oral intake.   Objective: Vitals:   05/07/21 0016 05/07/21 0452 05/07/21 0814 05/07/21 1459  BP: 121/65 (!) 141/96 (!) 148/72 (!) 147/65  Pulse: 95 91 (!) 101 99  Resp: 16 15 17 17   Temp: (!) 97.4 F (36.3 C) 98.2 F (36.8 C) 98.3 F (36.8 C) 98.4 F (36.9 C)  TempSrc: Oral Oral Oral Oral  SpO2: 100% 99% 100% 96%  Weight:      Height:       No intake or output data in the 24 hours ending 05/07/21 1806 Filed Weights   05/05/21 1806  Weight: 40.4  kg    Examination:  Constitutional: NAD, AAOx3 HEENT: conjunctivae and lids normal, EOMI CV: No cyanosis.   RESP: normal respiratory effort, on RA Neuro: II - XII grossly intact.   Psych: depressed mood and affect.     Data Reviewed: I have personally reviewed following labs and imaging studies  CBC: Recent Labs  Lab 05/05/21 1901 05/06/21 0430 05/07/21 0438  WBC 13.0* 10.5 6.8  NEUTROABS 8.5*  --   --   HGB 8.4* 7.7* 7.2*  HCT 27.7* 25.8* 22.3*  MCV 93.6 92.8 89.9  PLT 283 273 165    Basic Metabolic Panel: Recent Labs  Lab 05/05/21 1901 05/06/21 0430 05/07/21 0438  NA 137 138 136  K 4.1 4.4 4.3  CL 106 107 109  CO2 25 22 22   GLUCOSE 130* 188* 129*  BUN 20 25*  27*  CREATININE 1.00 1.11* 1.33*  CALCIUM 7.9* 8.0* 7.6*  MG  --  1.8 1.6*  PHOS  --  3.8  --     GFR: Estimated Creatinine Clearance: 19.4 mL/min (A) (by C-G formula based on SCr of 1.33 mg/dL (H)).  Liver Function Tests: Recent Labs  Lab 05/05/21 1901 05/06/21 0430  AST 81* 79*  ALT 81* 87*  ALKPHOS 123 117  BILITOT 0.5 0.9  PROT 5.3* 5.6*  ALBUMIN 3.1* 3.2*    CBG: Recent Labs  Lab 05/06/21 0742  GLUCAP 174*    Recent Results (from the past 240 hour(s))  Resp Panel by RT-PCR (Flu A&B, Covid) Nasopharyngeal Swab     Status: None   Collection Time: 05/05/21 10:13 PM   Specimen: Nasopharyngeal Swab; Nasopharyngeal(NP) swabs in vial transport medium  Result Value Ref Range Status   SARS Coronavirus 2 by RT PCR NEGATIVE NEGATIVE Final    Comment: (NOTE) SARS-CoV-2 target nucleic acids are NOT DETECTED.  The SARS-CoV-2 RNA is generally detectable in upper respiratory specimens during the acute phase of infection. The lowest concentration of SARS-CoV-2 viral copies this assay can detect is 138 copies/mL. A negative result does not preclude SARS-Cov-2 infection and should not be used as the sole basis for treatment or other patient management decisions. A negative result may occur with  improper specimen collection/handling, submission of specimen other than nasopharyngeal swab, presence of viral mutation(s) within the areas targeted by this assay, and inadequate number of viral copies(<138 copies/mL). A negative result must be combined with clinical observations, patient history, and epidemiological information. The expected result is Negative.  Fact Sheet for Patients:  05/07/21  Fact Sheet for Healthcare Providers:  BloggerCourse.com  This test is no t yet approved or cleared by the [employer] FDA and  has been authorized for detection and/or diagnosis of SARS-CoV-2 by FDA under an Emergency Use  Authorization (EUA). This EUA will remain  in effect (meaning this test can be used) for the duration of the COVID-19 declaration under Section 564(b)(1) of the Act, 21 U.S.C.section 360bbb-3(b)(1), unless the authorization is terminated  or revoked sooner.       Influenza A by PCR NEGATIVE NEGATIVE Final   Influenza B by PCR NEGATIVE NEGATIVE Final    Comment: (NOTE) The Xpert Xpress SARS-CoV-2/FLU/RSV plus assay is intended as an aid in the diagnosis of influenza from Nasopharyngeal swab specimens and should not be used as a sole basis for treatment. Nasal washings and aspirates are unacceptable for Xpert Xpress SARS-CoV-2/FLU/RSV testing.  Fact Sheet for Patients: [place]  Fact Sheet for Healthcare Providers: BloggerCourse.com  This test is not yet  approved or cleared by the [place] and has been authorized for detection and/or diagnosis of SARS-CoV-2 by FDA under an Emergency Use Authorization (EUA). This EUA will remain in effect (meaning this test can be used) for the duration of the COVID-19 declaration under Section 564(b)(1) of the Act, 21 U.S.C. section 360bbb-3(b)(1), unless the authorization is terminated or revoked.  Performed at [facility], [address] [place], [place] [address]      Radiology Studies: DG Pelvis 1-2 Views  Result Date: 05/05/2021 CLINICAL DATA:  Fall EXAM: PELVIS - 1-2 VIEW COMPARISON:  01/02/2019 FINDINGS: Right hip replacement in satisfactory position and alignment. No pelvic fracture. Normal left hip. Atherosclerotic calcification noted. IMPRESSION: Right hip replacement.  Negative for pelvic fracture. Electronically Signed   By: Marlan Palau M.D.   On: 05/05/2021 19:06   CT Head Wo Contrast  Result Date: 05/05/2021 CLINICAL DATA:  Head trauma.  Fall at home. EXAM: CT HEAD WITHOUT CONTRAST TECHNIQUE: Contiguous axial images were obtained from the base of the skull  through the vertex without intravenous contrast. COMPARISON:  Head CT and brain MRI 02/15/2021 FINDINGS: Brain: No intracranial hemorrhage, mass effect, or midline shift. Stable degree of atrophy. No hydrocephalus. The basilar cisterns are patent. Advanced chronic small vessel ischemia. No definite CT correlate for the multiple punctate infarcts in the left cerebral hemisphere on prior MRI. No evidence of territorial infarct or acute ischemia. No extra-axial or intracranial fluid collection. Vascular: Punctate calcifications in the left frontal lobe are unchanged from prior exam. There is no hyperdense vessel. Skull: No fracture or focal lesion. Sinuses/Orbits: Paranasal sinuses and mastoid air cells are clear. The visualized orbits are unremarkable. Bilateral lens extraction. Other: None. IMPRESSION: 1. No acute intracranial abnormality. No skull fracture. 2. Stable atrophy and chronic small vessel ischemia. Electronically Signed   By: Narda Rutherford M.D.   On: 05/05/2021 20:22   DG Chest Portable 1 View  Result Date: 05/05/2021 CLINICAL DATA:  Fall EXAM: PORTABLE CHEST 1 VIEW COMPARISON:  02/15/2021 FINDINGS: The heart size and mediastinal contours are within normal limits. Both lungs are clear. The visualized skeletal structures are unremarkable. Atherosclerotic aortic arch. IMPRESSION: No active disease. Electronically Signed   By: Marlan Palau M.D.   On: 05/05/2021 19:29   DG C-Arm 1-60 Min  Result Date: 05/06/2021 CLINICAL DATA:  ORIF distal right femur fracture. EXAM: RIGHT FEMUR 2 VIEWS; DG C-ARM 1-60 MIN COMPARISON:  Radiograph yesterday. FINDINGS: Nine fluoroscopic spot views of the right femur obtained in the operating room in frontal and lateral projections. Lateral plate and multi screw fixation of femoral shaft fracture. Hip and knee arthroplasties are partially included. Fluoroscopy time 1 minutes 27 seconds. Dose 5.31 mGy. IMPRESSION: Fluoroscopic spot views during ORIF of right femur  fracture. Electronically Signed   By: Narda Rutherford M.D.   On: 05/06/2021 18:09   DG Femur Portable 1 View Right  Result Date: 05/05/2021 CLINICAL DATA:  Fall. EXAM: RIGHT FEMUR PORTABLE 1 VIEW COMPARISON:  12/31/2018 FINDINGS: Right hip replacement in satisfactory position and alignment. There is an oblique fracture of the mid to distal femur with displacement and mild angulation. Right knee replacement is present. The fracture ends above the prosthesis. IMPRESSION: Fracture mid to distal femur on the right with mild angulation and displacement Right hip and right knee replacement. Electronically Signed   By: Marlan Palau M.D.   On: 05/05/2021 19:05   DG FEMUR, MIN 2 VIEWS RIGHT  Result Date: 05/06/2021 CLINICAL DATA:  ORIF distal right femur fracture. EXAM: RIGHT FEMUR 2 VIEWS; DG C-ARM 1-60 MIN COMPARISON:  Radiograph yesterday. FINDINGS: Nine fluoroscopic spot views of the right femur obtained in the operating room in frontal and lateral projections. Lateral plate and multi screw fixation of femoral shaft fracture. Hip and knee arthroplasties are partially included. Fluoroscopy time 1 minutes 27 seconds. Dose 5.31 mGy. IMPRESSION: Fluoroscopic spot views during ORIF of right femur fracture. Electronically Signed   By: Narda Rutherford M.D.   On: 05/06/2021 18:09   DG FEMUR PORT, MIN 2 VIEWS RIGHT  Result Date: 05/06/2021 CLINICAL DATA:  87 year female status post internal fixation of right femoral fracture. EXAM: RIGHT FEMUR PORTABLE 2 VIEW COMPARISON:  None. FINDINGS: There is a right hip arthroplasty. Right femoral fixation sideplate and screw noted through an oblique distal femoral diaphysis fracture. The hardware is intact. There is a total knee arthroplasty. Postsurgical changes with small pockets of air in the soft tissues of the right thigh. Vascular calcifications. IMPRESSION: Status post internal fixation of an oblique distal femoral diaphysis fracture. Electronically Signed    By: Elgie Collard M.D.   On: 05/06/2021 19:28    Scheduled Meds:  acetaminophen  1,000 mg Oral Q8H   amLODipine  5 mg Oral Daily   aspirin EC  81 mg Oral Daily   atorvastatin  20 mg Oral QPM   cholecalciferol  1,000 Units Oral Daily   clopidogrel  75 mg Oral Daily   docusate sodium  100 mg Oral BID   ferrous sulfate  325 mg Oral TID PC   pantoprazole  40 mg Oral QHS   sodium chloride flush  10-40 mL Intracatheter Q12H   Continuous Infusions:     LOS: 2 days    Darlin Priestly, MD  05/07/2021, 6:06 PM

## 2021-05-08 DIAGNOSIS — S7291XA Unspecified fracture of right femur, initial encounter for closed fracture: Secondary | ICD-10-CM

## 2021-05-08 LAB — BPAM RBC
Blood Product Expiration Date: 202207022359
Blood Product Expiration Date: 202207032359
ISSUE DATE / TIME: 202206221642
ISSUE DATE / TIME: 202206231811
Unit Type and Rh: 9500
Unit Type and Rh: 9500

## 2021-05-08 LAB — TYPE AND SCREEN
ABO/RH(D): O NEG
Antibody Screen: NEGATIVE
Unit division: 0
Unit division: 0

## 2021-05-08 LAB — MAGNESIUM: Magnesium: 2 mg/dL (ref 1.7–2.4)

## 2021-05-08 MED ORDER — LEFLUNOMIDE 20 MG PO TABS
20.0000 mg | ORAL_TABLET | Freq: Every day | ORAL | Status: DC
  Administered 2021-05-08 – 2021-05-12 (×5): 20 mg via ORAL
  Filled 2021-05-08 (×5): qty 1

## 2021-05-08 MED ORDER — TRAMADOL HCL 50 MG PO TABS: 100.0000 mg | ORAL_TABLET | Freq: Four times a day (QID) | ORAL | 0 refills | Status: DC | PRN

## 2021-05-08 MED ORDER — ENSURE ENLIVE PO LIQD
237.0000 mL | Freq: Two times a day (BID) | ORAL | Status: DC
  Administered 2021-05-08 – 2021-05-12 (×8): 237 mL via ORAL

## 2021-05-08 MED ORDER — POLYETHYLENE GLYCOL 3350 17 G PO PACK
17.0000 g | PACK | Freq: Two times a day (BID) | ORAL | Status: DC
  Administered 2021-05-08 – 2021-05-09 (×3): 17 g via ORAL
  Filled 2021-05-08 (×3): qty 1

## 2021-05-08 NOTE — Care Management Important Message (Signed)
Important Message  Patient Details  Name: [name] [name] MRN: [id number] Date of Birth: 09/30/1932   Medicare Important Message Given:  Yes     Dorena Bodo 05/08/2021, 1:13 PM

## 2021-05-08 NOTE — Progress Notes (Signed)
Physical Therapy Treatment Patient Details Name: DONNAMARIE SHANKLES MRN: [id number] DOB: 01-12-1932 Today's Date: 05/08/2021    History of Present Illness 87 y.o. female s/p fall at home with resulting R femur fx. Underwent ORIF R femur 05/06/21. Medical history significant for  rheumatoid arthritis, oxygen dependent COPD (O2 at night), hypertension, prior strokes (01/2021 with residual R sided weakness), R THA,, falls, anxiety disorder.    PT Comments    Pt making good progress today.  She was able to ambulate 5'x2 with mod A of 2 and cues for sequencing.  Good participation with exercise with good quad activation.  Continue to advance as able. Continue to recommend SNF.     Follow Up Recommendations  SNF     Equipment Recommendations  Wheelchair cushion (measurements PT);Wheelchair (measurements PT) (further assessment post acute)    Recommendations for Other Services       Precautions / Restrictions Precautions Precautions: Fall Restrictions Weight Bearing Restrictions: Yes RLE Weight Bearing: Weight bearing as tolerated    Mobility  Bed Mobility Overal bed mobility: Needs Assistance Bed Mobility: Supine to Sit     Supine to sit: Min assist;+2 for safety/equipment     General bed mobility comments: Cues for sequencing; increased time; assist for R LE    Transfers Overall transfer level: Needs assistance Equipment used: Rolling walker (2 wheeled) Transfers: Sit to/from Stand Sit to Stand: Min assist;+2 safety/equipment         General transfer comment: Increased time, cues for hand placement with min A of 2 to rise; performed x 3 (bed and chair x 2)  Ambulation/Gait Ambulation/Gait assistance: Mod assist;+2 physical assistance Gait Distance (Feet): 5 Feet (5'x2) Assistive device: Rolling walker (2 wheeled) Gait Pattern/deviations: Step-to pattern;Decreased stride length;Shuffle;Scissoring;Narrow base of support Gait velocity: decreased   General Gait Details:  Pt ambulated 5'x2 with chair follow.  Pt with very narrow BOS with scissoring at times - daughter reports has done this since hip replacement in 2020.  Required mod A of 2 for balance at times with repeated cues for sequencing and assist with RW   Stairs             Wheelchair Mobility    Modified Rankin (Stroke Patients Only)       Balance Overall balance assessment: History of Falls;Needs assistance Sitting-balance support: No upper extremity supported Sitting balance-Leahy Scale: Good     Standing balance support: Bilateral upper extremity supported Standing balance-Leahy Scale: Poor Standing balance comment: Requiring bil UE and min-mod A for static stand                            Cognition Arousal/Alertness: Awake/alert Behavior During Therapy: WFL for tasks assessed/performed Overall Cognitive Status: Impaired/Different from baseline Area of Impairment: Orientation;Attention;Memory;Following commands;Safety/judgement;Awareness;Problem solving                 Orientation Level: Disoriented to;Place;Time Current Attention Level: Sustained Memory: Decreased recall of precautions;Decreased short-term memory Following Commands: Follows one step commands with increased time Safety/Judgement: Decreased awareness of deficits;Decreased awareness of safety Awareness: Emergent Problem Solving: Slow processing General Comments: required repetition      Exercises General Exercises - Lower Extremity Ankle Circles/Pumps: AROM;Both;10 reps;Supine Quad Sets: AROM;Both;10 reps;Supine Long Arc Quad: AROM;Left;AAROM;Right;10 reps;Seated Other Exercises Other Exercises: Gentle knee flexion on R x 5 to tolerance    General Comments General comments (skin integrity, edema, etc.): Pt was on 1 L O2 at arrival with sats 99%.  Left on RA during therapy with sats 94%.  Did place back on 1 L post therapy due to pt lethargic from pain meds      Pertinent  Vitals/Pain Pain Assessment: Faces Faces Pain Scale: Hurts little more Pain Location: r hip Pain Descriptors / Indicators: Discomfort Pain Intervention(s): Limited activity within patient's tolerance;Monitored during session;Repositioned    Home Living                      Prior Function            PT Goals (current goals can now be found in the care plan section) Acute Rehab PT Goals Patient Stated Goal: per family to get rehab at [facility] PT Goal Formulation: With patient/family Time For Goal Achievement: 05/21/21 Potential to Achieve Goals: Good Progress towards PT goals: Progressing toward goals    Frequency    Min 3X/week      PT Plan Current plan remains appropriate    Co-evaluation              AM-PAC PT "6 Clicks" Mobility   Outcome Measure  Help needed turning from your back to your side while in a flat bed without using bedrails?: A Little Help needed moving from lying on your back to sitting on the side of a flat bed without using bedrails?: A Lot Help needed moving to and from a bed to a chair (including a wheelchair)?: Total Help needed standing up from a chair using your arms (e.g., wheelchair or bedside chair)?: A Little Help needed to walk in hospital room?: Total Help needed climbing 3-5 steps with a railing? : Total 6 Click Score: 11    End of Session Equipment Utilized During Treatment: Gait belt Activity Tolerance: Patient tolerated treatment well Patient left: with call bell/phone within reach;with family/visitor present;with chair alarm set;in chair Nurse Communication: Mobility status;Other (comment) (updated white board) PT Visit Diagnosis: Unsteadiness on feet (R26.81);History of falling (Z91.81);Muscle weakness (generalized) (M62.81)     Time: 5974-1638 PT Time Calculation (min) (ACUTE ONLY): 24 min  Charges:  $Gait Training: 8-22 mins $Therapeutic Exercise: 8-22 mins                     Anise Salvo, PT Acute Rehab  Services Pager [phone] [facility] [phone]    Rayetta Humphrey 05/08/2021, 12:12 PM

## 2021-05-08 NOTE — Plan of Care (Signed)
  Problem: Safety: Goal: Ability to remain free from injury will improve Outcome: Progressing   Problem: Pain Managment: Goal: General experience of comfort will improve Outcome: Progressing   

## 2021-05-08 NOTE — Progress Notes (Signed)
PROGRESS NOTE   DELFINA SCHREURS  KYH:[id number] DOB: Oct 24, 1932 DOA: 05/05/2021 PCP: Richmond Campbell., PA-C   Chief Complaint  Patient presents with   Leg Pain   Level of care: Med-Surg  Brief Admission History:  87 y.o. female with medical history significant for  rheumatoid arthritis, oxygen dependent COPD from former smoking, hypertension, prior strokes, falls, anxiety disorder who presents to the emergency department after sustaining a fall at home.  Patient states that she was trying to step out of the doorway onto the deck of her house without using walker and she sustained a fall with difficulty in being able to bear weight on the right leg due to right thigh/hip pain.  Patient denies any head injury, chest pain, shortness of breath, numbness or tingling in the extremities.  Orthopedic surgeon on call was consulted and he made an arrangement for patient to be admitted to [facility] per ED PA.  Hospitalist was asked to admit patient for further evaluation and management.  Assessment & Plan:   Principal Problem:   Right femoral fracture (HCC) Active Problems:   Hypertension   COPD (chronic obstructive pulmonary disease) (HCC)   Anxiety   History of rheumatoid arthritis   Fall at home   Hyponatremia   Dehydration   Hypoalbuminemia   History of stroke   GERD (gastroesophageal reflux disease)   Acute right femoral fracture from fall at home   S/p ORIF on 05/06/21 - Pt transferred to [facility] for operative management with Dr. Jena Gauss  Plan: --tramadol 100 mg q4h PRN for pain (only opioid pt would take) --cont tylenol 1g q8h --per ortho, VTE prophylaxis: Aspirin and Plavix , SCDs --d/c to SNF  Hyponatremia, resolved  Hypoalbuminemia --Ensure BID  History of CVA  --cont ASA and plavix  RA  --resume home Arava today  Essential hypertension  --BP varied widely --cont home amlodipine --hold home ramipril for now  Anxiety --cont home Xanax BID PRN  GERD  --cont  PPI  Anemia --s/p 2u pRBC --anemia workup   DVT prophylaxis: SCDs Code Status: DNR  Family Communication:   Disposition: anticipating SNF  Status is: Inpatient  Dispo: The patient is from: Home              Anticipated d/c is to: SNF              Patient currently is medically stable to d/c.    Difficult to place patient No  Consultants:  Orthopedics Dr. Jena Gauss  Procedures:  Pending   Antimicrobials:  N/a   Subjective: Pt reported pain much improved.  Ate well.   Objective: Vitals:   05/07/21 2057 05/08/21 0658 05/08/21 0824 05/08/21 1336  BP: (!) 144/69 (!) 181/85 (!) 178/78 129/66  Pulse: 96 95 87 92  Resp: 18 16    Temp: 97.9 F (36.6 C) 98.6 F (37 C) 98.5 F (36.9 C) 98.5 F (36.9 C)  TempSrc: Oral Oral Oral Oral  SpO2: 99% 100% 91% 99%  Weight:      Height:        Intake/Output Summary (Last 24 hours) at 05/08/2021 1449 Last data filed at 05/08/2021 1517 Gross per 24 hour  Intake 615 ml  Output 1150 ml  Net -535 ml   Filed Weights   05/05/21 1806  Weight: 40.4 kg    Examination:  Constitutional: NAD, AAOx3 HEENT: conjunctivae and lids normal, EOMI CV: No cyanosis.   RESP: normal respiratory effort SKIN: warm, dry, severe bruising over LLE.  RLE wrapped by ACE wrap Neuro: II - XII grossly intact.   Psych: Normal mood and affect.  Appropriate judgement and reason   Data Reviewed: I have personally reviewed following labs and imaging studies  CBC: Recent Labs  Lab 05/05/21 1901 05/06/21 0430 05/07/21 0438  WBC 13.0* 10.5 6.8  NEUTROABS 8.5*  --   --   HGB 8.4* 7.7* 7.2*  HCT 27.7* 25.8* 22.3*  MCV 93.6 92.8 89.9  PLT 283 273 165    Basic Metabolic Panel: Recent Labs  Lab 05/05/21 1901 05/06/21 0430 05/07/21 0438 05/08/21 0405  NA 137 138 136  --   K 4.1 4.4 4.3  --   CL 106 107 109  --   CO2 25 22 22   --   GLUCOSE 130* 188* 129*  --   BUN 20 25* 27*  --   CREATININE 1.00 1.11* 1.33*  --   CALCIUM 7.9* 8.0* 7.6*  --    MG  --  1.8 1.6* 2.0  PHOS  --  3.8  --   --     GFR: Estimated Creatinine Clearance: 19.4 mL/min (A) (by C-G formula based on SCr of 1.33 mg/dL (H)).  Liver Function Tests: Recent Labs  Lab 05/05/21 1901 05/06/21 0430  AST 81* 79*  ALT 81* 87*  ALKPHOS 123 117  BILITOT 0.5 0.9  PROT 5.3* 5.6*  ALBUMIN 3.1* 3.2*    CBG: Recent Labs  Lab 05/06/21 0742  GLUCAP 174*    Recent Results (from the past 240 hour(s))  Resp Panel by RT-PCR (Flu A&B, Covid) Nasopharyngeal Swab     Status: None   Collection Time: 05/05/21 10:13 PM   Specimen: Nasopharyngeal Swab; Nasopharyngeal(NP) swabs in vial transport medium  Result Value Ref Range Status   SARS Coronavirus 2 by RT PCR NEGATIVE NEGATIVE Final    Comment: (NOTE) SARS-CoV-2 target nucleic acids are NOT DETECTED.  The SARS-CoV-2 RNA is generally detectable in upper respiratory specimens during the acute phase of infection. The lowest concentration of SARS-CoV-2 viral copies this assay can detect is 138 copies/mL. A negative result does not preclude SARS-Cov-2 infection and should not be used as the sole basis for treatment or other patient management decisions. A negative result may occur with  improper specimen collection/handling, submission of specimen other than nasopharyngeal swab, presence of viral mutation(s) within the areas targeted by this assay, and inadequate number of viral copies(<138 copies/mL). A negative result must be combined with clinical observations, patient history, and epidemiological information. The expected result is Negative.  Fact Sheet for Patients:  05/07/21  Fact Sheet for Healthcare Providers:  BloggerCourse.com  This test is no t yet approved or cleared by the [place] FDA and  has been authorized for detection and/or diagnosis of SARS-CoV-2 by FDA under an Emergency Use Authorization (EUA). This EUA will remain  in effect  (meaning this test can be used) for the duration of the COVID-19 declaration under Section 564(b)(1) of the Act, 21 U.S.C.section 360bbb-3(b)(1), unless the authorization is terminated  or revoked sooner.       Influenza A by PCR NEGATIVE NEGATIVE Final   Influenza B by PCR NEGATIVE NEGATIVE Final    Comment: (NOTE) The Xpert Xpress SARS-CoV-2/FLU/RSV plus assay is intended as an aid in the diagnosis of influenza from Nasopharyngeal swab specimens and should not be used as a sole basis for treatment. Nasal washings and aspirates are unacceptable for Xpert Xpress SARS-CoV-2/FLU/RSV testing.  Fact Sheet for Patients: [place]  Fact Sheet for Healthcare Providers: SeriousBroker.it  This test is not yet approved or cleared by the [place] FDA and has been authorized for detection and/or diagnosis of SARS-CoV-2 by FDA under an Emergency Use Authorization (EUA). This EUA will remain in effect (meaning this test can be used) for the duration of the COVID-19 declaration under Section 564(b)(1) of the Act, 21 U.S.C. section 360bbb-3(b)(1), unless the authorization is terminated or revoked.  Performed at [facility], [address] [place], [place] [address]      Radiology Studies: DG C-Arm 1-60 Min  Result Date: 05/06/2021 CLINICAL DATA:  ORIF distal right femur fracture. EXAM: RIGHT FEMUR 2 VIEWS; DG C-ARM 1-60 MIN COMPARISON:  Radiograph yesterday. FINDINGS: Nine fluoroscopic spot views of the right femur obtained in the operating room in frontal and lateral projections. Lateral plate and multi screw fixation of femoral shaft fracture. Hip and knee arthroplasties are partially included. Fluoroscopy time 1 minutes 27 seconds. Dose 5.31 mGy. IMPRESSION: Fluoroscopic spot views during ORIF of right femur fracture. Electronically Signed   By: Narda Rutherford M.D.   On: 05/06/2021 18:09   DG FEMUR, MIN 2 VIEWS RIGHT  Result  Date: 05/06/2021 CLINICAL DATA:  ORIF distal right femur fracture. EXAM: RIGHT FEMUR 2 VIEWS; DG C-ARM 1-60 MIN COMPARISON:  Radiograph yesterday. FINDINGS: Nine fluoroscopic spot views of the right femur obtained in the operating room in frontal and lateral projections. Lateral plate and multi screw fixation of femoral shaft fracture. Hip and knee arthroplasties are partially included. Fluoroscopy time 1 minutes 27 seconds. Dose 5.31 mGy. IMPRESSION: Fluoroscopic spot views during ORIF of right femur fracture. Electronically Signed   By: Narda Rutherford M.D.   On: 05/06/2021 18:09   DG FEMUR PORT, MIN 2 VIEWS RIGHT  Result Date: 05/06/2021 CLINICAL DATA:  87 year female status post internal fixation of right femoral fracture. EXAM: RIGHT FEMUR PORTABLE 2 VIEW COMPARISON:  None. FINDINGS: There is a right hip arthroplasty. Right femoral fixation sideplate and screw noted through an oblique distal femoral diaphysis fracture. The hardware is intact. There is a total knee arthroplasty. Postsurgical changes with small pockets of air in the soft tissues of the right thigh. Vascular calcifications. IMPRESSION: Status post internal fixation of an oblique distal femoral diaphysis fracture. Electronically Signed   By: Elgie Collard M.D.   On: 05/06/2021 19:28    Scheduled Meds:  acetaminophen  1,000 mg Oral Q8H   amLODipine  5 mg Oral Daily   aspirin EC  81 mg Oral Daily   atorvastatin  20 mg Oral QPM   clopidogrel  75 mg Oral Daily   docusate sodium  100 mg Oral BID   ferrous sulfate  325 mg Oral TID PC   pantoprazole  40 mg Oral QHS   polyethylene glycol  17 g Oral BID   sodium chloride flush  10-40 mL Intracatheter Q12H   Continuous Infusions:     LOS: 3 days    Darlin Priestly, MD  05/08/2021, 2:49 PM

## 2021-05-08 NOTE — Discharge Instructions (Signed)
Orthopaedic Trauma Service Discharge Instructions   General Discharge Instructions  WEIGHT BEARING STATUS:Weightbearing as tolerated right lower extremity  RANGE OF MOTION/ACTIVITY: Ok for knee motion as tolerated  Wound Care:Incisions can be left open to air if there is no drainage. If incision continues to have drainage, follow wound care instructions below. Okay to shower if no drainage from incisions.  DVT/PE prophylaxis: Aspirin and Plavix  Diet: as you were eating previously.  Can use over the counter stool softeners and bowel preparations, such as Miralax, to help with bowel movements.  Narcotics can be constipating.  Be sure to drink plenty of fluids  PAIN MEDICATION USE AND EXPECTATIONS  You have likely been given narcotic medications to help control your pain.  After a traumatic event that results in an fracture (broken bone) with or without surgery, it is ok to use narcotic pain medications to help control one's pain.  We understand that everyone responds to pain differently and each individual patient will be evaluated on a regular basis for the continued need for narcotic medications. Ideally, narcotic medication use should last no more than 6-8 weeks (coinciding with fracture healing).   As a patient it is your responsibility as well to monitor narcotic medication use and report the amount and frequency you use these medications when you come to your office visit.   We would also advise that if you are using narcotic medications, you should take a dose prior to therapy to maximize you participation.  IF YOU ARE ON NARCOTIC MEDICATIONS IT IS NOT PERMISSIBLE TO OPERATE A MOTOR VEHICLE (MOTORCYCLE/CAR/TRUCK/MOPED) OR HEAVY MACHINERY DO NOT MIX NARCOTICS WITH OTHER CNS (CENTRAL NERVOUS SYSTEM) DEPRESSANTS SUCH AS ALCOHOL   STOP SMOKING OR USING NICOTINE PRODUCTS!!!!  As discussed nicotine severely impairs your body's ability to heal surgical and traumatic wounds but also  impairs bone healing.  Wounds and bone heal by forming microscopic blood vessels (angiogenesis) and nicotine is a vasoconstrictor (essentially, shrinks blood vessels).  Therefore, if vasoconstriction occurs to these microscopic blood vessels they essentially disappear and are unable to deliver necessary nutrients to the healing tissue.  This is one modifiable factor that you can do to dramatically increase your chances of healing your injury.    (This means no smoking, no nicotine gum, patches, etc)  DO NOT USE NONSTEROIDAL ANTI-INFLAMMATORY DRUGS (NSAID'S)  Using products such as Advil (ibuprofen), Aleve (naproxen), Motrin (ibuprofen) for additional pain control during fracture healing can delay and/or prevent the healing response.  If you would like to take over the counter (OTC) medication, Tylenol (acetaminophen) is ok.  However, some narcotic medications that are given for pain control contain acetaminophen as well. Therefore, you should not exceed more than 4000 mg of tylenol in a day if you do not have liver disease.  Also note that there are may OTC medicines, such as cold medicines and allergy medicines that my contain tylenol as well.  If you have any questions about medications and/or interactions please ask your doctor/PA or your pharmacist.      ICE AND ELEVATE INJURED/OPERATIVE EXTREMITY  Using ice and elevating the injured extremity above your heart can help with swelling and pain control.  Icing in a pulsatile fashion, such as 20 minutes on and 20 minutes off, can be followed.    Do not place ice directly on skin. Make sure there is a barrier between to skin and the ice pack.    Using frozen items such as frozen peas works well as  the conform nicely to the are that needs to be iced.  USE AN ACE WRAP OR TED HOSE FOR SWELLING CONTROL  In addition to icing and elevation, Ace wraps or TED hose are used to help limit and resolve swelling.  It is recommended to use Ace wraps or TED hose until  you are informed to stop.    When using Ace Wraps start the wrapping distally (farthest away from the body) and wrap proximally (closer to the body)   Example: If you had surgery on your leg or thing and you do not have a splint on, start the ace wrap at the toes and work your way up to the thigh        If you had surgery on your upper extremity and do not have a splint on, start the ace wrap at your fingers and work your way up to the upper arm   CALL THE OFFICE WITH ANY QUESTIONS OR CONCERNS: [phone]   VISIT OUR WEBSITE FOR ADDITIONAL INFORMATION: [email or web address]     Discharge Wound Care Instructions  Do NOT apply any ointments, solutions or lotions to pin sites or surgical wounds.  These prevent needed drainage and even though solutions like hydrogen peroxide kill bacteria, they also damage cells lining the pin sites that help fight infection.  Applying lotions or ointments can keep the wounds moist and can cause them to breakdown and open up as well. This can increase the risk for infection. When in doubt call the office.  If any drainage is noted, use one layer of adaptic, then gauze, Kerlix, and an ace wrap.  Once the incision is completely dry and without drainage, it may be left open to air out.  Showering may begin 36-48 hours later.  Cleaning gently with soap and water.  Traumatic wounds should be dressed daily as well.    One layer of adaptic, gauze, Kerlix, then ace wrap.  The adaptic can be discontinued once the draining has ceased    If you have a wet to dry dressing: wet the gauze with saline the squeeze as much saline out so the gauze is moist (not soaking wet), place moistened gauze over wound, then place a dry gauze over the moist one, followed by Kerlix wrap, then ace wrap.

## 2021-05-08 NOTE — TOC Progression Note (Addendum)
Transition of Care [facility]) - Progression Note    Patient Details  Name: [name] [name] MRN: [id number] Date of Birth: [birth year]  [facility]) CM/SW Contact  Ralene Bathe, LCSWA Phone Number: 05/08/2021, 3:05 PM  Clinical Narrative:     CSW spoke with [name] [name] at [facility].  The agency can extend a bed offer, but there is currently a COVID outbreak.    CSW called the patient's daughter, [name] Asp, and informed her of the information above.  The daughter is going to speak with her other siblings and decide if they would like to send the patient to [facility] or to another facility where a bed offer has been extended.    Pending: family's decision about SNF placement.    15:37:  CSW received a call from the patient's daughter.  The family would like for the patient to go to [facility] in [place].    CSW called [facility] in [place] and did not receive an answer.  CSW left a message requesting a returned call.     16:22- CSW received a call from the patient's daughter [name] Asp requesting that CSW send a referral to [facility].  CSW sent the FL2 to the facility.        Expected Discharge Plan and Services                                                 Social Determinants of Health (SDOH) Interventions    Readmission Risk Interventions No flowsheet data found.

## 2021-05-08 NOTE — Plan of Care (Signed)

## 2021-05-08 NOTE — Progress Notes (Signed)
Orthopaedic Trauma Progress Note  SUBJECTIVE: Doing okay this morning, was awake part of the night.  Feels a little loopy this morning.  Pain remains controlled on oral medications.  No chest pain. No SOB. No nausea/vomiting. No other complaints.  Daughter at bedside  OBJECTIVE:  Vitals:   05/07/21 2057 05/08/21 0658  BP: (!) 144/69 (!) 181/85  Pulse: 96 95  Resp: 18 16  Temp: 97.9 F (36.6 C) 98.6 F (37 C)  SpO2: 99% 100%    General: Laying in bed, NAD Respiratory: No increased work of breathing.  Right lower extremity: Dressing the knee and thigh removed, incisions are clean, dry, intact with Dermabond in place.  There is some bruising around the incision as expected.  Dressing over lower leg left in place, is currently clean, dry, intact. Mild tenderness throughout the thigh as expected. Tolerates gentle knee motion.  Ankle DF/PF intact. Endorses sensation to light touch. +DP pulse  IMAGING: Stable post op imaging.   LABS:  Results for orders placed or performed during the hospital encounter of 05/05/21 (from the past 24 hour(s))  Magnesium     Status: None   Collection Time: 05/08/21  4:05 AM  Result Value Ref Range   Magnesium 2.0 1.7 - 2.4 mg/dL    ASSESSMENT: [name] [name] is a 87 y.o. female, 2 Days Post-Op  s/p OPEN REDUCTION INTERNAL FIXATION RIGHT DISTAL FEMUR FRACTURE  CV/Blood loss: Acute blood loss anemia, Hgb 7.2 on 05/07/21. CBC pending this AM. Received 2 units PRBCs perioperatively.  PLAN: Weightbearing: WBAT RLE Incisional and dressing care: Ok to leave thigh incisions open to air. Change lower leg dressing PRN Showering: Hold off on showering for now.  Okay for bed bath Orthopedic device(s): None  Pain management:  1. Tylenol 1000 mg q 8 hours scheduled 2. Fentanyl 25 mcg q 2 hours PRN 3. Tramadol 50 mg q 6 hours PRN VTE prophylaxis: Aspirin and Plavix , SCDs ID: Clindamycin postop completed Foley/Lines:  No foley, KVO IVFs Impediments to Fracture  Healing: Vitamin D level 109, have stopped current supplementation.  Dispo: Therapies as tolerated, PT/OT recommending SNF. Patient ok for d/c from ortho standpoint once cleared by medicine team and therapies. D/C rx for Tramadol signed and placed in chart.  Plan to continue on home dose Plavix and aspirin for DVT prophylaxis  Follow - up plan: 2 weeks after discharge for repeat x-rays and wound check  Contact information:  Truitt Merle MD, Ulyses Southward PA-C. After hours and holidays please check [email or web address] for group call information for [facility]   Kampbell Holaway A. Michaelyn Barter, PA-C [phone] (office) [email or web address]

## 2021-05-09 LAB — BASIC METABOLIC PANEL
Anion gap: 9 (ref 5–15)
BUN: 17 mg/dL (ref 8–23)
CO2: 20 mmol/L — ABNORMAL LOW (ref 22–32)
Calcium: 8.3 mg/dL — ABNORMAL LOW (ref 8.9–10.3)
Chloride: 107 mmol/L (ref 98–111)
Creatinine, Ser: 0.85 mg/dL (ref 0.44–1.00)
GFR, Estimated: >60 mL/min (ref >60)
Glucose, Bld: 82 mg/dL (ref 70–99)
Potassium: 3.6 mmol/L (ref 3.5–5.1)
Sodium: 136 mmol/L (ref 135–145)

## 2021-05-09 LAB — CBC
HCT: 26.4 % — ABNORMAL LOW (ref 36.0–46.0)
Hemoglobin: 8.6 g/dL — ABNORMAL LOW (ref 12.0–15.0)
MCH: 28.9 pg (ref 26.0–34.0)
MCHC: 32.6 g/dL (ref 30.0–36.0)
MCV: 88.6 fL (ref 80.0–100.0)
Platelets: 161 10*3/uL (ref 150–400)
RBC: 2.98 MIL/uL — ABNORMAL LOW (ref 3.87–5.11)
RDW: 17.3 % — ABNORMAL HIGH (ref 11.5–15.5)
WBC: 7.2 10*3/uL (ref 4.0–10.5)
nRBC: 0 % (ref 0.0–0.2)

## 2021-05-09 LAB — IRON AND TIBC
Iron: 20 ug/dL — ABNORMAL LOW (ref 28–170)
Saturation Ratios: 9 % — ABNORMAL LOW (ref 10.4–31.8)
TIBC: 217 ug/dL — ABNORMAL LOW (ref 250–450)
UIBC: 197 ug/dL

## 2021-05-09 LAB — VITAMIN B12: Vitamin B-12: 255 pg/mL (ref 180–914)

## 2021-05-09 LAB — MAGNESIUM: Magnesium: 1.8 mg/dL (ref 1.7–2.4)

## 2021-05-09 LAB — FOLATE: Folate: 13.4 ng/mL (ref >5.9)

## 2021-05-09 MED ORDER — SODIUM CHLORIDE 0.9 % IV SOLN: 200.0000 mg | Freq: Every day | INTRAVENOUS | Status: DC

## 2021-05-09 MED ORDER — MOMETASONE FURO-FORMOTEROL FUM 200-5 MCG/ACT IN AERO: 2.0000 | INHALATION_SPRAY | Freq: Two times a day (BID) | RESPIRATORY_TRACT | Status: DC

## 2021-05-09 MED ORDER — RAMIPRIL 10 MG PO CAPS
20.0000 mg | ORAL_CAPSULE | Freq: Every day | ORAL | Status: DC
  Administered 2021-05-09 – 2021-05-12 (×4): 20 mg via ORAL
  Filled 2021-05-09 (×4): qty 2

## 2021-05-09 MED ORDER — METOPROLOL TARTRATE 25 MG PO TABS
25.0000 mg | ORAL_TABLET | Freq: Two times a day (BID) | ORAL | Status: DC
  Administered 2021-05-09 – 2021-05-11 (×5): 25 mg via ORAL
  Filled 2021-05-09 (×5): qty 1

## 2021-05-09 MED ORDER — VITAMIN B-12 1000 MCG PO TABS
1000.0000 ug | ORAL_TABLET | Freq: Every day | ORAL | Status: DC
  Administered 2021-05-09 – 2021-05-12 (×4): 1000 ug via ORAL
  Filled 2021-05-09 (×4): qty 1

## 2021-05-09 MED ORDER — MOMETASONE FURO-FORMOTEROL FUM 200-5 MCG/ACT IN AERO
2.0000 | INHALATION_SPRAY | Freq: Two times a day (BID) | RESPIRATORY_TRACT | Status: DC
  Administered 2021-05-09 – 2021-05-12 (×6): 2 via RESPIRATORY_TRACT
  Filled 2021-05-09: qty 8.8

## 2021-05-09 MED ORDER — SODIUM CHLORIDE 0.9 % IV SOLN
125.0000 mg | Freq: Every day | INTRAVENOUS | Status: AC
  Administered 2021-05-09 – 2021-05-11 (×3): 125 mg via INTRAVENOUS
  Filled 2021-05-09 (×3): qty 10

## 2021-05-09 NOTE — Plan of Care (Signed)
  Problem: Education: Goal: Knowledge of General Education information will improve Description Including pain rating scale, medication(s)/side effects and non-pharmacologic comfort measures Outcome: Progressing   Problem: Health Behavior/Discharge Planning: Goal: Ability to manage health-related needs will improve Outcome: Progressing   

## 2021-05-09 NOTE — Progress Notes (Addendum)
PROGRESS NOTE   [name] [name]  MGN:[id number] DOB: 1932-07-13 DOA: 05/05/2021 PCP: Richmond Campbell., PA-C   Chief Complaint  Patient presents with   Leg Pain   Level of care: Med-Surg  Brief Admission History:  87 y.o. female with medical history significant for  rheumatoid arthritis, oxygen dependent COPD from former smoking, hypertension, prior strokes, falls, anxiety disorder who presents to the emergency department after sustaining a fall at home.  Patient states that she was trying to step out of the doorway onto the deck of her house without using walker and she sustained a fall with difficulty in being able to bear weight on the right leg due to right thigh/hip pain.  Patient denies any head injury, chest pain, shortness of breath, numbness or tingling in the extremities.  Orthopedic surgeon on call was consulted and he made an arrangement for patient to be admitted to [facility] per ED PA.  Hospitalist was asked to admit patient for further evaluation and management.  Assessment & Plan:   Principal Problem:   Right femoral fracture (HCC) Active Problems:   Hypertension   COPD (chronic obstructive pulmonary disease) (HCC)   Anxiety   History of rheumatoid arthritis   Fall at home   Hyponatremia   Dehydration   Hypoalbuminemia   History of stroke   GERD (gastroesophageal reflux disease)   Acute right femoral fracture from fall at home   S/p ORIF on 05/06/21 - Pt transferred to [facility] for operative management with Dr. Jena Gauss  Plan: --tramadol 100 mg q4h PRN for pain (the only opioid pt would take) --cont tylenol 1g q8h --per ortho, VTE prophylaxis: Aspirin and Plavix , SCDs --discharge to SNF  Hyponatremia, resolved  Hypoalbuminemia --Ensure BID  History of CVA  --cont ASA and plavix  RA  --cont home Arava  Essential hypertension  --BP varied widely --cont home amlodipine --resume home metop and ramipril today  Anxiety --cont home Xanax BID PRN  GERD   --cont PPI  Anemia, iron def --s/p 2u pRBC --anemia workup showed iron def and borderline low Vit B12 --start IV iron --cont oral iron --start Vit B12 supplement  COPD Chronic hypoxia on 2L O2 at night --resume home bronchodilator   DVT prophylaxis: lovenox Code Status: DNR  Family Communication:   Disposition: anticipating SNF  Status is: Inpatient  Dispo: The patient is from: Home              Anticipated d/c is to: SNF              Patient currently is medically stable to d/c.    Difficult to place patient No  Consultants:  Orthopedics Dr. Jena Gauss  Procedures:  Pending   Antimicrobials:  N/a   Subjective: Pt reported choking on pills earlier this morning.  Pain controlled.  Had BM's.    Objective: Vitals:   05/08/21 1336 05/08/21 2039 05/09/21 0930 05/09/21 1452  BP: 129/66 (!) 169/90 (!) 172/99 (!) 192/88  Pulse: 92 (!) 107 (!) 102 86  Resp:  18 17 18   Temp: 98.5 F (36.9 C) 98.8 F (37.1 C) 98.4 F (36.9 C) (!) 97.4 F (36.3 C)  TempSrc: Oral Oral Oral Oral  SpO2: 99% 99% 100% 97%  Weight:      Height:        Intake/Output Summary (Last 24 hours) at 05/09/2021 1535 Last data filed at 05/09/2021 1309 Gross per 24 hour  Intake --  Output 500 ml  Net -500 ml  Filed Weights   05/05/21 1806  Weight: 40.4 kg    Examination:  Constitutional: NAD, AAOx3 HEENT: conjunctivae and lids normal, EOMI CV: No cyanosis.   RESP: Upper airway wheezing, on 2L Extremities: No effusions, edema in BLE SKIN: warm, dry Neuro: II - XII grossly intact.   Psych: Normal mood and affect.  Appropriate judgement and reason   Data Reviewed: I have personally reviewed following labs and imaging studies  CBC: Recent Labs  Lab 05/05/21 1901 05/06/21 0430 05/07/21 0438 05/09/21 0358  WBC 13.0* 10.5 6.8 7.2  NEUTROABS 8.5*  --   --   --   HGB 8.4* 7.7* 7.2* 8.6*  HCT 27.7* 25.8* 22.3* 26.4*  MCV 93.6 92.8 89.9 88.6  PLT 283 273 165 161    Basic  Metabolic Panel: Recent Labs  Lab 05/05/21 1901 05/06/21 0430 05/07/21 0438 05/08/21 0405 05/09/21 0358  NA 137 138 136  --  136  K 4.1 4.4 4.3  --  3.6  CL 106 107 109  --  107  CO2 25 22 22   --  20*  GLUCOSE 130* 188* 129*  --  82  BUN 20 25* 27*  --  17  CREATININE 1.00 1.11* 1.33*  --  0.85  CALCIUM 7.9* 8.0* 7.6*  --  8.3*  MG  --  1.8 1.6* 2.0 1.8  PHOS  --  3.8  --   --   --     GFR: Estimated Creatinine Clearance: 30.3 mL/min (by C-G formula based on SCr of 0.85 mg/dL).  Liver Function Tests: Recent Labs  Lab 05/05/21 1901 05/06/21 0430  AST 81* 79*  ALT 81* 87*  ALKPHOS 123 117  BILITOT 0.5 0.9  PROT 5.3* 5.6*  ALBUMIN 3.1* 3.2*    CBG: Recent Labs  Lab 05/06/21 0742  GLUCAP 174*    Recent Results (from the past 240 hour(s))  Resp Panel by RT-PCR (Flu A&B, Covid) Nasopharyngeal Swab     Status: None   Collection Time: 05/05/21 10:13 PM   Specimen: Nasopharyngeal Swab; Nasopharyngeal(NP) swabs in vial transport medium  Result Value Ref Range Status   SARS Coronavirus 2 by RT PCR NEGATIVE NEGATIVE Final    Comment: (NOTE) SARS-CoV-2 target nucleic acids are NOT DETECTED.  The SARS-CoV-2 RNA is generally detectable in upper respiratory specimens during the acute phase of infection. The lowest concentration of SARS-CoV-2 viral copies this assay can detect is 138 copies/mL. A negative result does not preclude SARS-Cov-2 infection and should not be used as the sole basis for treatment or other patient management decisions. A negative result may occur with  improper specimen collection/handling, submission of specimen other than nasopharyngeal swab, presence of viral mutation(s) within the areas targeted by this assay, and inadequate number of viral copies(<138 copies/mL). A negative result must be combined with clinical observations, patient history, and epidemiological information. The expected result is Negative.  Fact Sheet for Patients:   05/07/21  Fact Sheet for Healthcare Providers:  BloggerCourse.com  This test is no t yet approved or cleared by the [employer] FDA and  has been authorized for detection and/or diagnosis of SARS-CoV-2 by FDA under an Emergency Use Authorization (EUA). This EUA will remain  in effect (meaning this test can be used) for the duration of the COVID-19 declaration under Section 564(b)(1) of the Act, 21 U.S.C.section 360bbb-3(b)(1), unless the authorization is terminated  or revoked sooner.       Influenza A by PCR NEGATIVE NEGATIVE Final  Influenza B by PCR NEGATIVE NEGATIVE Final    Comment: (NOTE) The Xpert Xpress SARS-CoV-2/FLU/RSV plus assay is intended as an aid in the diagnosis of influenza from Nasopharyngeal swab specimens and should not be used as a sole basis for treatment. Nasal washings and aspirates are unacceptable for Xpert Xpress SARS-CoV-2/FLU/RSV testing.  Fact Sheet for Patients: BloggerCourse.com  Fact Sheet for Healthcare Providers: SeriousBroker.it  This test is not yet approved or cleared by the [place] FDA and has been authorized for detection and/or diagnosis of SARS-CoV-2 by FDA under an Emergency Use Authorization (EUA). This EUA will remain in effect (meaning this test can be used) for the duration of the COVID-19 declaration under Section 564(b)(1) of the Act, 21 U.S.C. section 360bbb-3(b)(1), unless the authorization is terminated or revoked.  Performed at [facility], [address] [place], [place] [address]      Radiology Studies: No results found.  Scheduled Meds:  acetaminophen  1,000 mg Oral Q8H   amLODipine  5 mg Oral Daily   aspirin EC  81 mg Oral Daily   atorvastatin  20 mg Oral QPM   clopidogrel  75 mg Oral Daily   docusate sodium  100 mg Oral BID   feeding supplement  237 mL Oral BID BM   ferrous sulfate  325 mg  Oral TID PC   leflunomide  20 mg Oral Daily   metoprolol tartrate  25 mg Oral BID   mometasone-formoterol  2 puff Inhalation BID   pantoprazole  40 mg Oral QHS   ramipril  20 mg Oral Daily   sodium chloride flush  10-40 mL Intracatheter Q12H   vitamin B-12  1,000 mcg Oral Daily   Continuous Infusions:  ferric gluconate (FERRLECIT) IVPB 125 mg (05/09/21 1243)      LOS: 4 days    Darlin Priestly, MD  05/09/2021, 3:35 PM

## 2021-05-10 LAB — CBC
HCT: 24.8 % — ABNORMAL LOW (ref 36.0–46.0)
Hemoglobin: 8.3 g/dL — ABNORMAL LOW (ref 12.0–15.0)
MCH: 29.1 pg (ref 26.0–34.0)
MCHC: 33.5 g/dL (ref 30.0–36.0)
MCV: 87 fL (ref 80.0–100.0)
Platelets: 153 10*3/uL (ref 150–400)
RBC: 2.85 MIL/uL — ABNORMAL LOW (ref 3.87–5.11)
RDW: 17 % — ABNORMAL HIGH (ref 11.5–15.5)
WBC: 5.9 10*3/uL (ref 4.0–10.5)
nRBC: 0.3 % — ABNORMAL HIGH (ref 0.0–0.2)

## 2021-05-10 LAB — BASIC METABOLIC PANEL
Anion gap: 6 (ref 5–15)
BUN: 13 mg/dL (ref 8–23)
CO2: 24 mmol/L (ref 22–32)
Calcium: 7.9 mg/dL — ABNORMAL LOW (ref 8.9–10.3)
Chloride: 106 mmol/L (ref 98–111)
Creatinine, Ser: 0.79 mg/dL (ref 0.44–1.00)
GFR, Estimated: >60 mL/min (ref >60)
Glucose, Bld: 83 mg/dL (ref 70–99)
Potassium: 3 mmol/L — ABNORMAL LOW (ref 3.5–5.1)
Sodium: 136 mmol/L (ref 135–145)

## 2021-05-10 LAB — MAGNESIUM: Magnesium: 1.7 mg/dL (ref 1.7–2.4)

## 2021-05-10 MED ORDER — ENOXAPARIN SODIUM 30 MG/0.3ML IJ SOSY: 30.0000 mg | PREFILLED_SYRINGE | INTRAMUSCULAR | Status: DC

## 2021-05-10 MED ORDER — POTASSIUM CHLORIDE 20 MEQ PO PACK
40.0000 meq | PACK | ORAL | Status: AC
  Administered 2021-05-10 (×2): 40 meq via ORAL
  Filled 2021-05-10 (×2): qty 2

## 2021-05-10 MED ORDER — ENOXAPARIN SODIUM 40 MG/0.4ML IJ SOSY
40.0000 mg | PREFILLED_SYRINGE | INTRAMUSCULAR | Status: DC
  Administered 2021-05-10: 40 mg via SUBCUTANEOUS
  Filled 2021-05-10: qty 0.4

## 2021-05-10 NOTE — Plan of Care (Signed)
  Problem: Pain Managment: Goal: General experience of comfort will improve Outcome: Progressing   Problem: Safety: Goal: Ability to remain free from injury will improve Outcome: Progressing   Problem: Activity: Goal: Risk for activity intolerance will decrease Outcome: Progressing   

## 2021-05-10 NOTE — Progress Notes (Signed)
PROGRESS NOTE   [name] [name]  BLT:[id number] DOB: 1932/11/10 DOA: 05/05/2021 PCP: Richmond Campbell., PA-C   Chief Complaint  Patient presents with   Leg Pain   Level of care: Med-Surg  Brief Admission History:  87 y.o. female with medical history significant for  rheumatoid arthritis, oxygen dependent COPD from former smoking, hypertension, prior strokes, falls, anxiety disorder who presents to the emergency department after sustaining a fall at home.  Patient states that she was trying to step out of the doorway onto the deck of her house without using walker and she sustained a fall with difficulty in being able to bear weight on the right leg due to right thigh/hip pain.  Patient denies any head injury, chest pain, shortness of breath, numbness or tingling in the extremities.  Orthopedic surgeon on call was consulted and he made an arrangement for patient to be admitted to [facility] per ED PA.  Hospitalist was asked to admit patient for further evaluation and management.  Assessment & Plan:   Principal Problem:   Right femoral fracture (HCC) Active Problems:   Hypertension   COPD (chronic obstructive pulmonary disease) (HCC)   Anxiety   History of rheumatoid arthritis   Fall at home   Hyponatremia   Dehydration   Hypoalbuminemia   History of stroke   GERD (gastroesophageal reflux disease)   Acute right femoral fracture from fall at home   S/p ORIF on 05/06/21 - Pt transferred to [facility] for operative management with Dr. Jena Gauss  Plan: --tramadol 100 mg q4h PRN for pain (the only opioid pt would take) --cont tylenol 1g q8h --per ortho, VTE prophylaxis: Aspirin and Plavix , SCDs --PT/OT, d/c to SNF rehab  Hyponatremia, resolved  Hypoalbuminemia --Ensure BID  History of CVA  --cont ASA and plavix  RA  --cont home Arava  Essential hypertension  --BP varied widely --cont home amlodipine, metop and ramipril  Anxiety --cont home Xanax BID PRN  GERD  --cont  PPI  Anemia, iron def --s/p 2u pRBC --anemia workup showed iron def and borderline low Vit B12 Plan: --cont IV iron --cont vit B12 supplement  COPD Chronic hypoxia on 2L O2 at night --cont home bronchodilator   DVT prophylaxis: lovenox Code Status: DNR  Family Communication:   Disposition: anticipating SNF  Status is: Inpatient  Dispo: The patient is from: Home              Anticipated d/c is to: SNF              Patient currently is medically stable to d/c.    Difficult to place patient No  Consultants:  Orthopedics Dr. Jena Gauss  Procedures:  Pending   Antimicrobials:  N/a   Subjective: Pt denied pain.  No new complaints.  Normal oral intake.   Objective: Vitals:   05/09/21 2045 05/10/21 0747 05/10/21 0851 05/10/21 1503  BP: (!) 154/80 (!) 154/73  (!) 188/85  Pulse: 88 79  70  Resp: 16 17  18   Temp: 98.2 F (36.8 C) 98.5 F (36.9 C)  97.6 F (36.4 C)  TempSrc: Oral Oral  Oral  SpO2: 98% 100% 98% 94%  Weight:      Height:        Intake/Output Summary (Last 24 hours) at 05/10/2021 1537 Last data filed at 05/09/2021 2300 Gross per 24 hour  Intake 350 ml  Output 500 ml  Net -150 ml   Filed Weights   05/05/21 1806  Weight: 40.4 kg  Examination:  Constitutional: NAD, AAOx3, sitting in recliner HEENT: conjunctivae and lids normal, EOMI CV: No cyanosis.   RESP: normal respiratory effort, on RA Extremities: No effusions, edema in BLE SKIN: warm, dry Neuro: II - XII grossly intact.   Psych: Normal mood and affect.  Appropriate judgement and reason   Data Reviewed: I have personally reviewed following labs and imaging studies  CBC: Recent Labs  Lab 05/05/21 1901 05/06/21 0430 05/07/21 0438 05/09/21 0358 05/10/21 0131  WBC 13.0* 10.5 6.8 7.2 5.9  NEUTROABS 8.5*  --   --   --   --   HGB 8.4* 7.7* 7.2* 8.6* 8.3*  HCT 27.7* 25.8* 22.3* 26.4* 24.8*  MCV 93.6 92.8 89.9 88.6 87.0  PLT 283 273 165 161 153    Basic Metabolic Panel: Recent  Labs  Lab 05/05/21 1901 05/06/21 0430 05/07/21 0438 05/08/21 0405 05/09/21 0358 05/10/21 0131  NA 137 138 136  --  136 136  K 4.1 4.4 4.3  --  3.6 3.0*  CL 106 107 109  --  107 106  CO2 25 22 22   --  20* 24  GLUCOSE 130* 188* 129*  --  82 83  BUN 20 25* 27*  --  17 13  CREATININE 1.00 1.11* 1.33*  --  0.85 0.79  CALCIUM 7.9* 8.0* 7.6*  --  8.3* 7.9*  MG  --  1.8 1.6* 2.0 1.8 1.7  PHOS  --  3.8  --   --   --   --     GFR: Estimated Creatinine Clearance: 32.2 mL/min (by C-G formula based on SCr of 0.79 mg/dL).  Liver Function Tests: Recent Labs  Lab 05/05/21 1901 05/06/21 0430  AST 81* 79*  ALT 81* 87*  ALKPHOS 123 117  BILITOT 0.5 0.9  PROT 5.3* 5.6*  ALBUMIN 3.1* 3.2*    CBG: Recent Labs  Lab 05/06/21 0742  GLUCAP 174*    Recent Results (from the past 240 hour(s))  Resp Panel by RT-PCR (Flu A&B, Covid) Nasopharyngeal Swab     Status: None   Collection Time: 05/05/21 10:13 PM   Specimen: Nasopharyngeal Swab; Nasopharyngeal(NP) swabs in vial transport medium  Result Value Ref Range Status   SARS Coronavirus 2 by RT PCR NEGATIVE NEGATIVE Final    Comment: (NOTE) SARS-CoV-2 target nucleic acids are NOT DETECTED.  The SARS-CoV-2 RNA is generally detectable in upper respiratory specimens during the acute phase of infection. The lowest concentration of SARS-CoV-2 viral copies this assay can detect is 138 copies/mL. A negative result does not preclude SARS-Cov-2 infection and should not be used as the sole basis for treatment or other patient management decisions. A negative result may occur with  improper specimen collection/handling, submission of specimen other than nasopharyngeal swab, presence of viral mutation(s) within the areas targeted by this assay, and inadequate number of viral copies(<138 copies/mL). A negative result must be combined with clinical observations, patient history, and epidemiological information. The expected result is  Negative.  Fact Sheet for Patients:  05/07/21  Fact Sheet for Healthcare Providers:  BloggerCourse.com  This test is no t yet approved or cleared by the [employer] FDA and  has been authorized for detection and/or diagnosis of SARS-CoV-2 by FDA under an Emergency Use Authorization (EUA). This EUA will remain  in effect (meaning this test can be used) for the duration of the COVID-19 declaration under Section 564(b)(1) of the Act, 21 U.S.C.section 360bbb-3(b)(1), unless the authorization is terminated  or revoked sooner.  Influenza A by PCR NEGATIVE NEGATIVE Final   Influenza B by PCR NEGATIVE NEGATIVE Final    Comment: (NOTE) The Xpert Xpress SARS-CoV-2/FLU/RSV plus assay is intended as an aid in the diagnosis of influenza from Nasopharyngeal swab specimens and should not be used as a sole basis for treatment. Nasal washings and aspirates are unacceptable for Xpert Xpress SARS-CoV-2/FLU/RSV testing.  Fact Sheet for Patients: BloggerCourse.com  Fact Sheet for Healthcare Providers: SeriousBroker.it  This test is not yet approved or cleared by the [place] FDA and has been authorized for detection and/or diagnosis of SARS-CoV-2 by FDA under an Emergency Use Authorization (EUA). This EUA will remain in effect (meaning this test can be used) for the duration of the COVID-19 declaration under Section 564(b)(1) of the Act, 21 U.S.C. section 360bbb-3(b)(1), unless the authorization is terminated or revoked.  Performed at [facility], [address], [place], [place] [address]      Radiology Studies: No results found.  Scheduled Meds:  acetaminophen  1,000 mg Oral Q8H   amLODipine  5 mg Oral Daily   aspirin EC  81 mg Oral Daily   atorvastatin  20 mg Oral QPM   clopidogrel  75 mg Oral Daily   docusate sodium  100 mg Oral BID   enoxaparin (LOVENOX)  injection  40 mg Subcutaneous Q24H   feeding supplement  237 mL Oral BID BM   ferrous sulfate  325 mg Oral TID PC   leflunomide  20 mg Oral Daily   metoprolol tartrate  25 mg Oral BID   mometasone-formoterol  2 puff Inhalation BID   pantoprazole  40 mg Oral QHS   ramipril  20 mg Oral Daily   sodium chloride flush  10-40 mL Intracatheter Q12H   vitamin B-12  1,000 mcg Oral Daily   Continuous Infusions:  ferric gluconate (FERRLECIT) IVPB 125 mg (05/10/21 0911)      LOS: 5 days    Darlin Priestly, MD  05/10/2021, 3:37 PM

## 2021-05-11 LAB — CBC
HCT: 26.8 % — ABNORMAL LOW (ref 36.0–46.0)
Hemoglobin: 8.8 g/dL — ABNORMAL LOW (ref 12.0–15.0)
MCH: 29.1 pg (ref 26.0–34.0)
MCHC: 32.8 g/dL (ref 30.0–36.0)
MCV: 88.7 fL (ref 80.0–100.0)
Platelets: 190 10*3/uL (ref 150–400)
RBC: 3.02 MIL/uL — ABNORMAL LOW (ref 3.87–5.11)
RDW: 17.4 % — ABNORMAL HIGH (ref 11.5–15.5)
WBC: 6.9 10*3/uL (ref 4.0–10.5)
nRBC: 0 % (ref 0.0–0.2)

## 2021-05-11 LAB — BASIC METABOLIC PANEL
Anion gap: 5 (ref 5–15)
BUN: 14 mg/dL (ref 8–23)
CO2: 23 mmol/L (ref 22–32)
Calcium: 8 mg/dL — ABNORMAL LOW (ref 8.9–10.3)
Chloride: 108 mmol/L (ref 98–111)
Creatinine, Ser: 0.92 mg/dL (ref 0.44–1.00)
GFR, Estimated: >60 mL/min (ref >60)
Glucose, Bld: 88 mg/dL (ref 70–99)
Potassium: 4.9 mmol/L (ref 3.5–5.1)
Sodium: 136 mmol/L (ref 135–145)

## 2021-05-11 LAB — SARS CORONAVIRUS 2 (TAT 6-24 HRS): SARS Coronavirus 2: NEGATIVE

## 2021-05-11 LAB — MAGNESIUM: Magnesium: 1.7 mg/dL (ref 1.7–2.4)

## 2021-05-11 MED ORDER — HYDRALAZINE HCL 20 MG/ML IJ SOLN: 10.0000 mg | Freq: Four times a day (QID) | INTRAMUSCULAR | Status: DC | PRN

## 2021-05-11 MED ORDER — METOPROLOL TARTRATE 25 MG PO TABS
25.0000 mg | ORAL_TABLET | Freq: Once | ORAL | Status: AC
  Administered 2021-05-11: 25 mg via ORAL
  Filled 2021-05-11: qty 1

## 2021-05-11 MED ORDER — METOPROLOL TARTRATE 50 MG PO TABS
50.0000 mg | ORAL_TABLET | Freq: Two times a day (BID) | ORAL | Status: DC
  Administered 2021-05-11 – 2021-05-12 (×2): 50 mg via ORAL
  Filled 2021-05-11 (×2): qty 1

## 2021-05-11 MED ORDER — ENOXAPARIN SODIUM 300 MG/3ML IJ SOLN
20.0000 mg | INTRAMUSCULAR | Status: DC
  Administered 2021-05-11: 20 mg via SUBCUTANEOUS
  Filled 2021-05-11 (×2): qty 0.2

## 2021-05-11 MED ORDER — HYDRALAZINE HCL 50 MG PO TABS: 50.0000 mg | ORAL_TABLET | Freq: Three times a day (TID) | ORAL | Status: DC

## 2021-05-11 MED ORDER — AMLODIPINE BESYLATE 5 MG PO TABS
5.0000 mg | ORAL_TABLET | Freq: Once | ORAL | Status: AC
  Administered 2021-05-11: 5 mg via ORAL
  Filled 2021-05-11: qty 1

## 2021-05-11 MED ORDER — AMLODIPINE BESYLATE 10 MG PO TABS
10.0000 mg | ORAL_TABLET | Freq: Every day | ORAL | Status: DC
  Administered 2021-05-12: 10 mg via ORAL
  Filled 2021-05-11: qty 1

## 2021-05-11 NOTE — TOC Progression Note (Addendum)
Transition of Care [facility]) - Progression Note    Patient Details  Name: [name] [name] MRN: [id number] Date of Birth: [birth year]  [facility] CM/SW Contact  Ralene Bathe, LCSWA Phone Number: 05/11/2021, 12:38 PM  Clinical Narrative:     CSW contacted Revonda Standard with [facility] to inquire about bed availability.  There was no answer.  CSW left another VM.    CSW spoke with Elease Hashimoto at [facility].  They are reviewing the referral and will inform CSW if the agency can extend a bed offer.     13:01-  CSW was informed that [facility] will be able to extend a bed offer tomorrow.  Patient's family informed.  CSW called [facility] and informed the insurance company of the patient's chosen SNF.  Insurance authorization has been approved from 6/28-6/30.  Number= [id number].  Attending and RN notified.  13:32-  CSW submitted information needed for PAS[facility]       Expected Discharge Plan and Services                                                 Social Determinants of Health (SDOH) Interventions    Readmission Risk Interventions No flowsheet data found.

## 2021-05-11 NOTE — Plan of Care (Signed)

## 2021-05-11 NOTE — Progress Notes (Addendum)
PROGRESS NOTE   [name] [name]  RSW:[id number] DOB: [birth year] DOA: 05/05/2021 PCP: Richmond Campbell., PA-C   Chief Complaint  Patient presents with   Leg Pain   Level of care: Med-Surg  Brief Admission History:  87 y.o. female with medical history significant for  rheumatoid arthritis, oxygen dependent COPD from former smoking, hypertension, prior strokes, falls, anxiety disorder who presents to the emergency department after sustaining a fall at home.  Patient states that she was trying to step out of the doorway onto the deck of her house without using walker and she sustained a fall with difficulty in being able to bear weight on the right leg due to right thigh/hip pain.  Patient denies any head injury, chest pain, shortness of breath, numbness or tingling in the extremities.  Orthopedic surgeon on call was consulted and he made an arrangement for patient to be admitted to [facility] per ED PA.  Hospitalist was asked to admit patient for further evaluation and management.  Assessment & Plan:   Principal Problem:   Right femoral fracture (HCC) Active Problems:   Hypertension   COPD (chronic obstructive pulmonary disease) (HCC)   Anxiety   History of rheumatoid arthritis   Fall at home   Hyponatremia   Dehydration   Hypoalbuminemia   History of stroke   GERD (gastroesophageal reflux disease)   Acute right femoral fracture from fall at home   S/p ORIF on 05/06/21 - Pt transferred to [facility] for operative management with Dr. Jena Gauss  Plan: --tramadol 100 mg q4h PRN for pain (the only opioid pt would take) --cont tylenol 1g q8h --per ortho, VTE prophylaxis: Aspirin and Plavix , SCDs --PT/OT, d/c to SNF rehab  Hyponatremia, resolved  Hypoalbuminemia --Ensure BID  History of CVA  --cont ASA and plavix  RA  --cont home Arava  Essential hypertension  --BP elevated --increase amlodipine to 10 mg daily --increase metop to 50 mg BID --cont home ramipril --IV hydralazine  PRN  Anxiety --cont home Xanax BID PRN  GERD  --cont PPI  Anemia, iron def --s/p 2u pRBC --anemia workup showed iron def and borderline low Vit B12 -s/p IV iron Plan: --cont oral iron suppl --cont vit B12 supplement  COPD Chronic hypoxia on 2L O2 at night --cont home bronchodilator  Hypomag --replete with IV mag  Mild protein calorie malnutrition, clinically undetermined  AKI, not POA, resolved --Cr peaked at 1.33 on 6/23, likely due to dehydration from NPO and surgery.   DVT prophylaxis: lovenox Code Status: DNR  Family Communication:  daughter updated at bedside today Disposition: anticipating SNF  Status is: Inpatient  Dispo: The patient is from: Home              Anticipated d/c is to: SNF              Patient currently is medically stable to d/c.    Difficult to place patient No  Consultants:  Orthopedics Dr. Jena Gauss  Procedures:  Pending   Antimicrobials:  N/a   Subjective: Pt reported doing fine.  No more choking event.  BP intermittently severely elevated.  Pain controlled.   Objective: Vitals:   05/10/21 1503 05/10/21 2052 05/11/21 0731 05/11/21 1447  BP: (!) 188/85 (!) 142/63 (!) 193/108 (!) 160/102  Pulse: 70 84 92 87  Resp: 18 18 17 17   Temp: 97.6 F (36.4 C) 98.1 F (36.7 C) 97.7 F (36.5 C) 97.8 F (36.6 C)  TempSrc: Oral Oral Oral Oral  SpO2: 94%  100% 97% 100%  Weight:      Height:        Intake/Output Summary (Last 24 hours) at 05/11/2021 1456 Last data filed at 05/11/2021 1000 Gross per 24 hour  Intake --  Output 900 ml  Net -900 ml   Filed Weights   05/05/21 1806  Weight: 40.4 kg    Examination:  Constitutional: NAD, AAOx3, sitting in recliner HEENT: conjunctivae and lids normal, EOMI CV: No cyanosis.   RESP: normal respiratory effort, on RA Extremities: No effusions, edema in BLE SKIN: warm, dry, extensive bruising  Neuro: II - XII grossly intact.   Psych: Normal mood and affect.  Appropriate judgement and  reason    Data Reviewed: I have personally reviewed following labs and imaging studies  CBC: Recent Labs  Lab 05/05/21 1901 05/06/21 0430 05/07/21 0438 05/09/21 0358 05/10/21 0131 05/11/21 0149  WBC 13.0* 10.5 6.8 7.2 5.9 6.9  NEUTROABS 8.5*  --   --   --   --   --   HGB 8.4* 7.7* 7.2* 8.6* 8.3* 8.8*  HCT 27.7* 25.8* 22.3* 26.4* 24.8* 26.8*  MCV 93.6 92.8 89.9 88.6 87.0 88.7  PLT 283 273 165 161 153 190    Basic Metabolic Panel: Recent Labs  Lab 05/06/21 0430 05/07/21 0438 05/08/21 0405 05/09/21 0358 05/10/21 0131 05/11/21 0149  NA 138 136  --  136 136 136  K 4.4 4.3  --  3.6 3.0* 4.9  CL 107 109  --  107 106 108  CO2 22 22  --  20* 24 23  GLUCOSE 188* 129*  --  82 83 88  BUN 25* 27*  --  CREATININE 1.11* 1.33*  --  0.85 0.79 0.92  CALCIUM 8.0* 7.6*  --  8.3* 7.9* 8.0*  MG 1.8 1.6* 2.0 1.8 1.7 1.7  PHOS 3.8  --   --   --   --   --     GFR: Estimated Creatinine Clearance: 28 mL/min (by C-G formula based on SCr of 0.92 mg/dL).  Liver Function Tests: Recent Labs  Lab 05/05/21 1901 05/06/21 0430  AST 81* 79*  ALT 81* 87*  ALKPHOS 123 117  BILITOT 0.5 0.9  PROT 5.3* 5.6*  ALBUMIN 3.1* 3.2*    CBG: Recent Labs  Lab 05/06/21 0742  GLUCAP 174*    Recent Results (from the past 240 hour(s))  Resp Panel by RT-PCR (Flu A&B, Covid) Nasopharyngeal Swab     Status: None   Collection Time: 05/05/21 10:13 PM   Specimen: Nasopharyngeal Swab; Nasopharyngeal(NP) swabs in vial transport medium  Result Value Ref Range Status   SARS Coronavirus 2 by RT PCR NEGATIVE NEGATIVE Final    Comment: (NOTE) SARS-CoV-2 target nucleic acids are NOT DETECTED.  The SARS-CoV-2 RNA is generally detectable in upper respiratory specimens during the acute phase of infection. The lowest concentration of SARS-CoV-2 viral copies this assay can detect is 138 copies/mL. A negative result does not preclude SARS-Cov-2 infection and should not be used as the sole basis for  treatment or other patient management decisions. A negative result may occur with  improper specimen collection/handling, submission of specimen other than nasopharyngeal swab, presence of viral mutation(s) within the areas targeted by this assay, and inadequate number of viral copies(<138 copies/mL). A negative result must be combined with clinical observations, patient history, and epidemiological information. The expected result is Negative.  Fact Sheet for Patients:  BloggerCourse.com  Fact Sheet for Healthcare Providers:  SeriousBroker.it  This test is no t yet approved or cleared by the [place] and  has been authorized for detection and/or diagnosis of SARS-CoV-2 by FDA under an Emergency Use Authorization (EUA). This EUA will remain  in effect (meaning this test can be used) for the duration of the COVID-19 declaration under Section 564(b)(1) of the Act, 21 U.S.C.section 360bbb-3(b)(1), unless the authorization is terminated  or revoked sooner.       Influenza A by PCR NEGATIVE NEGATIVE Final   Influenza B by PCR NEGATIVE NEGATIVE Final    Comment: (NOTE) The Xpert Xpress SARS-CoV-2/FLU/RSV plus assay is intended as an aid in the diagnosis of influenza from Nasopharyngeal swab specimens and should not be used as a sole basis for treatment. Nasal washings and aspirates are unacceptable for Xpert Xpress SARS-CoV-2/FLU/RSV testing.  Fact Sheet for Patients: BloggerCourse.com  Fact Sheet for Healthcare Providers: SeriousBroker.it  This test is not yet approved or cleared by the [place] FDA and has been authorized for detection and/or diagnosis of SARS-CoV-2 by FDA under an Emergency Use Authorization (EUA). This EUA will remain in effect (meaning this test can be used) for the duration of the COVID-19 declaration under Section 564(b)(1) of the Act, 21  U.S.C. section 360bbb-3(b)(1), unless the authorization is terminated or revoked.  Performed at [facility], [address], [place], [place] [address]      Radiology Studies: No results found.  Scheduled Meds:  acetaminophen  1,000 mg Oral Q8H   [START ON 05/12/2021] amLODipine  10 mg Oral Daily   amLODipine  5 mg Oral Once   aspirin EC  81 mg Oral Daily   atorvastatin  20 mg Oral QPM   clopidogrel  75 mg Oral Daily   docusate sodium  100 mg Oral BID   enoxaparin (LOVENOX) injection  20 mg Subcutaneous Q24H   feeding supplement  237 mL Oral BID BM   ferrous sulfate  325 mg Oral TID PC   leflunomide  20 mg Oral Daily   metoprolol tartrate  25 mg Oral Once   metoprolol tartrate  50 mg Oral BID   mometasone-formoterol  2 puff Inhalation BID   pantoprazole  40 mg Oral QHS   ramipril  20 mg Oral Daily   sodium chloride flush  10-40 mL Intracatheter Q12H   vitamin B-12  1,000 mcg Oral Daily   Continuous Infusions:      LOS: 6 days    Darlin Priestly, MD  05/11/2021, 2:56 PM

## 2021-05-11 NOTE — Progress Notes (Signed)
Orthopaedic Trauma Progress Note  SUBJECTIVE: Doing okay this morning.  Pain remains controlled. No other issues of note. No family at bedside  OBJECTIVE:  Vitals:   05/10/21 2052 05/11/21 0731  BP: (!) 142/63 (!) 193/108  Pulse: 84 92  Resp: 18 17  Temp: 98.1 F (36.7 C) 97.7 F (36.5 C)  SpO2: 100% 97%    General: Laying in bed, NAD Respiratory: No increased work of breathing.  Right lower extremity: Incisions over lateral knee/thigh are clean, dry, intact with Dermabond in place.  There is some bruising around the incision as expected.  Dressing over lower leg changed. Skin tears are stable with less serosangunois drainage. Mild tenderness throughout the thigh as expected. Ankle DF/PF intact. Endorses sensation to light touch. +DP pulse  IMAGING: Stable post op imaging.   LABS:  Results for orders placed or performed during the hospital encounter of 05/05/21 (from the past 24 hour(s))  Basic metabolic panel     Status: Abnormal   Collection Time: 05/11/21  1:49 AM  Result Value Ref Range   Sodium 136 135 - 145 mmol/L   Potassium 4.9 3.5 - 5.1 mmol/L   Chloride 108 98 - 111 mmol/L   CO2 23 22 - 32 mmol/L   Glucose, Bld 88 70 - 99 mg/dL   BUN 14 8 - 23 mg/dL   Creatinine, Ser 1.76 0.44 - 1.00 mg/dL   Calcium 8.0 (L) 8.9 - 10.3 mg/dL   GFR, Estimated >16 >07 mL/min   Anion gap 5 5 - 15  CBC     Status: Abnormal   Collection Time: 05/11/21  1:49 AM  Result Value Ref Range   WBC 6.9 4.0 - 10.5 K/uL   RBC 3.02 (L) 3.87 - 5.11 MIL/uL   Hemoglobin 8.8 (L) 12.0 - 15.0 g/dL   HCT 37.1 (L) 06.2 - 69.4 %   MCV 88.7 80.0 - 100.0 fL   MCH 29.1 26.0 - 34.0 pg   MCHC 32.8 30.0 - 36.0 g/dL   RDW 85.4 (H) 62.7 - 03.5 %   Platelets 190 150 - 400 K/uL   nRBC 0.0 0.0 - 0.2 %  Magnesium     Status: None   Collection Time: 05/11/21  1:49 AM  Result Value Ref Range   Magnesium 1.7 1.7 - 2.4 mg/dL    ASSESSMENT: THENA DEVORA is a 87 y.o. female, 5 Days Post-Op  s/p OPEN  REDUCTION INTERNAL FIXATION RIGHT DISTAL FEMUR FRACTURE  CV/Blood loss: Hgb 8.8 this morning, stable.   PLAN: Weightbearing: WBAT RLE Incisional and dressing care: Ok to leave thigh incisions open to air. Change lower leg dressing PRN Showering: Hold off on showering for now.  Okay for bed bath Orthopedic device(s): None  Pain management:  1. Tylenol 1000 mg q 8 hours scheduled 2. Fentanyl 25 mcg q 2 hours PRN 3. Tramadol 50 mg q 6 hours PRN VTE prophylaxis: Aspirin and Plavix , SCDs ID: Clindamycin postop completed Foley/Lines:  No foley, KVO IVFs Impediments to Fracture Healing: Vitamin D level 109, have stopped current supplementation.  Dispo: Therapies as tolerated, PT/OT recommending SNF. Patient ok for d/c from ortho standpoint once cleared by medicine team and therapies. D/C rx for Tramadol signed and placed in chart.  Plan to continue on home dose Plavix and aspirin for DVT prophylaxis  Follow - up plan: 2 weeks after discharge for repeat x-rays and wound check  Contact information:  Truitt Merle MD, Ulyses Southward PA-C. After hours and holidays  please check [email or web address] for group call information for [facility]   Margaurite Salido A. Michaelyn Barter, PA-C [phone] (office) [email or web address]

## 2021-05-11 NOTE — Progress Notes (Signed)
Physical Therapy Treatment Patient Details Name: [name] [name] MRN: [id number] DOB: 1931-12-07 Today's Date: 05/11/2021    History of Present Illness 87 y.o. female s/p fall at home with resulting R femur fx. Underwent ORIF R femur 05/06/21. Medical history significant for  rheumatoid arthritis, oxygen dependent COPD (O2 at night), hypertension, prior strokes (01/2021 with residual R sided weakness), R THA,, falls, anxiety disorder.    PT Comments    Pt supine in bed.  Pt eager to participate.  She remains weaker on R side due to injury and previous deficits from her CVA.  Pt continues to benefit from snf placement to improve strength and function before returning home.  Pt presents with resting into R hip IR and R ankle plantarflexion.  Spoke with MD who placed order for R prafo with kick stand.  After order placed contacted [employer] about patient needs.    Follow Up Recommendations  SNF     Equipment Recommendations  Wheelchair cushion (measurements PT);Wheelchair (measurements PT)    Recommendations for Other Services       Precautions / Restrictions Precautions Precautions: Fall Restrictions Weight Bearing Restrictions: Yes RLE Weight Bearing: Weight bearing as tolerated    Mobility  Bed Mobility Overal bed mobility: Needs Assistance Bed Mobility: Supine to Sit;Sit to Supine     Supine to sit: Mod assist     General bed mobility comments: Mod assistance to advance LEs to edge of bed and elevate trunk into a seated position.    Transfers Overall transfer level: Needs assistance Equipment used: Rolling walker (2 wheeled) (youth height) Transfers: Sit to/from Stand Sit to Stand: Mod assist         General transfer comment: Cues for hand placement to and from seated surface.  Pt listing posterior and to the R upon standing.  Pt also had a bout of bowel incontinence in standing.  Pt required assistance to control descent to seated  surface.  Ambulation/Gait Ambulation/Gait assistance: Max assist;+2 safety/equipment Gait Distance (Feet): 4 Feet (x2) Assistive device: Rolling walker (2 wheeled) (youth height) Gait Pattern/deviations: Step-to pattern;Decreased stride length;Shuffle;Scissoring;Narrow base of support Gait velocity: decreased   General Gait Details: Limited gt due to bowel incontinence and weakness.  Pt continues to present with ataxic movement of R LE to adduction.  Constant rest breaks to correct placement of R foot.   Stairs             Wheelchair Mobility    Modified Rankin (Stroke Patients Only)       Balance Overall balance assessment: History of Falls;Needs assistance Sitting-balance support: No upper extremity supported Sitting balance-Leahy Scale: Good     Standing balance support: Bilateral upper extremity supported Standing balance-Leahy Scale: Poor                              Cognition Arousal/Alertness: Awake/alert Behavior During Therapy: WFL for tasks assessed/performed Overall Cognitive Status: Impaired/Different from baseline Area of Impairment: Orientation;Attention;Memory;Following commands;Safety/judgement;Awareness;Problem solving                 Orientation Level: Disoriented to;Place;Time Current Attention Level: Sustained Memory: Decreased recall of precautions;Decreased short-term memory Following Commands: Follows one step commands with increased time Safety/Judgement: Decreased awareness of deficits;Decreased awareness of safety Awareness: Emergent Problem Solving: Slow processing General Comments: required repetition      Exercises General Exercises - Lower Extremity Ankle Circles/Pumps: AROM;Both;10 reps;Supine Quad Sets: AROM;Both;10 reps;Supine Heel Slides: AROM;AAROM;Both;10 reps;Supine Hip ABduction/ADduction:  AROM;AAROM;Both;10 reps;Supine    General Comments        Pertinent Vitals/Pain Pain Assessment: 0-10 Pain  Score: 6  Pain Location: R hip Pain Descriptors / Indicators: Discomfort Pain Intervention(s): Monitored during session;Repositioned    Home Living                      Prior Function            PT Goals (current goals can now be found in the care plan section) Acute Rehab PT Goals Patient Stated Goal: per family to get rehab at [facility] Potential to Achieve Goals: Good Progress towards PT goals: Progressing toward goals    Frequency    Min 3X/week      PT Plan Current plan remains appropriate    Co-evaluation              AM-PAC PT "6 Clicks" Mobility   Outcome Measure  Help needed turning from your back to your side while in a flat bed without using bedrails?: A Little Help needed moving from lying on your back to sitting on the side of a flat bed without using bedrails?: A Lot Help needed moving to and from a bed to a chair (including a wheelchair)?: A Lot Help needed standing up from a chair using your arms (e.g., wheelchair or bedside chair)?: A Lot Help needed to walk in hospital room?: A Lot Help needed climbing 3-5 steps with a railing? : A Lot 6 Click Score: 13    End of Session Equipment Utilized During Treatment: Gait belt Activity Tolerance: Patient tolerated treatment well Patient left: with call bell/phone within reach;with family/visitor present;with chair alarm set;in chair Nurse Communication: Mobility status PT Visit Diagnosis: Unsteadiness on feet (R26.81);History of falling (Z91.81);Muscle weakness (generalized) (M62.81)     Time: 7681-1572 PT Time Calculation (min) (ACUTE ONLY): 36 min  Charges:  $Gait Training: 8-22 mins $Therapeutic Exercise: 8-22 mins                     Bonney Leitz , PTA Acute Rehabilitation Services Pager [phone] Office [phone]    [name] [name] 05/11/2021, 12:58 PM

## 2021-05-12 LAB — BASIC METABOLIC PANEL
Anion gap: 7 (ref 5–15)
BUN: 16 mg/dL (ref 8–23)
CO2: 25 mmol/L (ref 22–32)
Calcium: 8.3 mg/dL — ABNORMAL LOW (ref 8.9–10.3)
Chloride: 104 mmol/L (ref 98–111)
Creatinine, Ser: 0.92 mg/dL (ref 0.44–1.00)
GFR, Estimated: >60 mL/min (ref >60)
Glucose, Bld: 91 mg/dL (ref 70–99)
Potassium: 4 mmol/L (ref 3.5–5.1)
Sodium: 136 mmol/L (ref 135–145)

## 2021-05-12 LAB — CBC
HCT: 25 % — ABNORMAL LOW (ref 36.0–46.0)
Hemoglobin: 8.2 g/dL — ABNORMAL LOW (ref 12.0–15.0)
MCH: 29.1 pg (ref 26.0–34.0)
MCHC: 32.8 g/dL (ref 30.0–36.0)
MCV: 88.7 fL (ref 80.0–100.0)
Platelets: 195 10*3/uL (ref 150–400)
RBC: 2.82 MIL/uL — ABNORMAL LOW (ref 3.87–5.11)
RDW: 17.6 % — ABNORMAL HIGH (ref 11.5–15.5)
WBC: 8.1 10*3/uL (ref 4.0–10.5)
nRBC: 0.2 % (ref 0.0–0.2)

## 2021-05-12 LAB — MAGNESIUM: Magnesium: 1.6 mg/dL — ABNORMAL LOW (ref 1.7–2.4)

## 2021-05-12 MED ORDER — MAGNESIUM SULFATE 2 GM/50ML IV SOLN
2.0000 g | Freq: Once | INTRAVENOUS | Status: DC
  Filled 2021-05-12: qty 50

## 2021-05-12 MED ORDER — CYANOCOBALAMIN 1000 MCG PO TABS: 1000.0000 ug | ORAL_TABLET | Freq: Every day | ORAL | Status: AC

## 2021-05-12 MED ORDER — FERROUS SULFATE 325 (65 FE) MG PO TABS
325.0000 mg | ORAL_TABLET | Freq: Every day | ORAL | 3 refills | Status: AC
Start: 2021-05-12 — End: ?

## 2021-05-12 MED ORDER — ALPRAZOLAM 0.25 MG PO TABS: 0.2500 mg | ORAL_TABLET | Freq: Two times a day (BID) | ORAL | 0 refills | Status: AC | PRN

## 2021-05-12 MED ORDER — MAGNESIUM OXIDE -MG SUPPLEMENT 400 (240 MG) MG PO TABS
400.0000 mg | ORAL_TABLET | Freq: Two times a day (BID) | ORAL | Status: DC
  Administered 2021-05-12: 400 mg via ORAL
  Filled 2021-05-12: qty 1

## 2021-05-12 MED ORDER — MAGNESIUM OXIDE -MG SUPPLEMENT 400 (240 MG) MG PO TABS: 400.0000 mg | ORAL_TABLET | Freq: Two times a day (BID) | ORAL | 0 refills | Status: AC

## 2021-05-12 MED ORDER — TRAMADOL HCL 50 MG PO TABS: 100.0000 mg | ORAL_TABLET | Freq: Four times a day (QID) | ORAL | 0 refills | Status: AC | PRN

## 2021-05-12 NOTE — Plan of Care (Signed)

## 2021-05-12 NOTE — TOC Progression Note (Addendum)
Transition of Care [facility]) - Progression Note    Patient Details  Name: [name] [name] MRN: [id number] Date of Birth: July 06, 1932  Transition of Care [facility] CM/SW Contact  Ralene Bathe, LCSWA Phone Number: 05/12/2021, 8:37 AM  Clinical Narrative:    8:37-  CSW checked [facility]CM[facility] to see if a PASRR has been assigned.  PASRR still pending.    9:47: PASSR received [id number]  10:01-  RN and attending informed that TOC has secured placement and insurance auth.        Expected Discharge Plan and Services                                                 Social Determinants of Health (SDOH) Interventions    Readmission Risk Interventions No flowsheet data found.

## 2021-05-12 NOTE — TOC Transition Note (Signed)
Transition of Care [facility]) - CM/SW Discharge Note   Patient Details  Name: PAMLEA FINDER MRN: [id number] Date of Birth: [birth year]  [facility]) CM/SW Contact:  Ralene Bathe, LCSWA Phone Number: 05/12/2021, 12:47 PM   Clinical Narrative:     Patient will DC to: [facility] Anticipated DC date: 05/12/2021 Family notified: yes Transport by: Sharin Mons   Per MD patient ready for DC to SNF. RN to call report prior to discharge [phone]. RN, patient, patient's family, and facility notified of DC. Discharge Summary and FL2 sent to facility. DC packet on chart. Ambulance transport will be requested for patient when RN reports that the patient is ready.   CSW will sign off for now as social work intervention is no longer needed. Please consult [place] again if new needs arise.    Final next level of care: Skilled Nursing Facility Barriers to Discharge: Barriers Resolved   Patient Goals and CMS Choice        Discharge Placement              Patient chooses bed at:  [facility]) Patient to be transferred to facility by: [facility] Name of family member notified: Morris,Cindy (Daughter)   [phone] Patient and family notified of of transfer: 05/12/21  Discharge Plan and Services                                     Social Determinants of Health (SDOH) Interventions     Readmission Risk Interventions No flowsheet data found.

## 2021-05-12 NOTE — Discharge Summary (Signed)
Physician Discharge Summary   [name] [name]  female DOB: 1932-08-17  WUJ:[id number]  PCP: Richmond Campbell., PA-C  Admit date: 05/05/2021 Discharge date: 05/12/2021  Admitted From: home Disposition:  SNF CODE STATUS: DNR  Discharge Instructions     Diet - low sodium heart healthy   Complete by: As directed    No wound care   Complete by: As directed         Hospital Course:  For full details, please see H&P, progress notes, consult notes and ancillary notes.  Briefly,  [name] [name] is a 87 y.o. female with medical history significant for  rheumatoid arthritis, COPD from former smoking, hypertension, prior strokes, falls, anxiety disorder who presented to the emergency department at [facility] initially after sustaining a fall at home.    Patient states that she was trying to step out of the doorway onto the deck of her house without using walker and she sustained a fall with difficulty in being able to bear weight on the right leg due to right thigh/hip pain.  Patient denies any head injury, chest pain, shortness of breath, numbness or tingling in the extremities.  Orthopedic surgeon on call was consulted and he made an arrangement for patient to be transferred to [facility] for surgical management.    Acute right femoral fracture from fall at home   S/p ORIF on 05/06/21 - Pt transferred to [facility] for operative management with Dr. Jena Gauss --tramadol 100 mg q4h PRN for pain (the only opioid pt would take) --per ortho, VTE prophylaxis: Aspirin and Plavix , SCDs --Weightbearing: WBAT RLE --Incisional and dressing care: Ok to leave thigh incisions open to air.  --Showering: Hold off on showering for now.   --Impediments to Fracture Healing: Vitamin D level 109, have stopped current supplementation. --Follow - up plan: 2 weeks after discharge for repeat x-rays and wound check with ortho clinic.   Hyponatremia, resolved   Hypoalbuminemia --Ensure BID   History of CVA   --cont ASA and plavix   RA --cont home Arava   Essential hypertension --BP had been intermittent elevated during hospitalization, likely due to stress, since BP improved prior to discharge.  Pt is therefore discharged back on home BP regimen of amlodipine, metop and ramipril.   Chronic Anxiety Has been taking home Xanax 0.25 mg every morning and 0.5 mg every evening for years.  Continue.   GERD  --cont PPI   Anemia, iron def --s/p 2u pRBC --anemia workup showed iron def and borderline low Vit B12 -received IV iron x2 --cont oral iron suppl daily --cont vit B12 supplement   COPD Chronic hypoxia on 2L O2 at night --cont home Symbicort   Hypomag --replete with IV mag   Mild protein calorie malnutrition, clinically undetermined   AKI, not POA, resolved --Cr peaked at 1.33 on 6/23, likely due to dehydration from NPO and surgery.  Cr 0.92 on the day of discharge.  Hypomag Repleted PRN.  Discharged on oral mag supplement for 14 days.   Discharge Diagnoses:  Principal Problem:   Right femoral fracture (HCC) Active Problems:   Hypertension   COPD (chronic obstructive pulmonary disease) (HCC)   Anxiety   History of rheumatoid arthritis   Fall at home   Hyponatremia   Dehydration   Hypoalbuminemia   History of stroke   GERD (gastroesophageal reflux disease)   30 Day Unplanned Readmission Risk Score    Flowsheet Row ED to Hosp-Admission (Current) from 05/05/2021 in [place]  [facility]  30 Day Unplanned Readmission Risk Score (%) 28.37 Filed at 05/12/2021 0801       This score is the patient's risk of an unplanned readmission within 30 days of being discharged (0 -100%). The score is based on dignosis, age, lab data, medications, orders, and past utilization.   Low:  0-14.9   Medium: 15-21.9   High: 22-29.9   Extreme: 30 and above          Discharge Instructions:  Allergies as of 05/12/2021       Reactions   Penicillins  Swelling, Anaphylaxis, Hives, Nausea And Vomiting   Has patient had a PCN reaction causing immediate rash, facial/tongue/throat swelling, SOB or lightheadedness with hypotension: Yes Has patient had a PCN reaction causing severe rash involving mucus membranes or skin necrosis: No Has patient had a PCN reaction that required hospitalization No Has patient had a PCN reaction occurring within the last 10 years: No If all of the above answers are "NO", then may proceed with Cephalosporin use. Has patient had a PCN reaction causing immediate rash, facial/tongue/throat swelling, SOB or lightheadedness with hypotension: Yes Has patient had a PCN reaction causing severe rash involving mucus membranes or skin necrosis: No Has patient had a PCN reaction that required hospitalization No Has patient had a PCN reaction occurring within the last 10 years: No If all of the above answers are "NO", then may proceed with Cephalosporin use.   Sulfa Antibiotics Hives   Cortisone Other (See Comments)   Irregular Heart Beat   Morphine And Related Nausea And Vomiting   Vicodin [hydrocodone-acetaminophen] Nausea And Vomiting   Motrin [ibuprofen]         Medication List     STOP taking these medications    acetaminophen 500 MG tablet Commonly known as: TYLENOL   cholecalciferol 1000 units tablet Commonly known as: VITAMIN D       TAKE these medications    albuterol 108 (90 Base) MCG/ACT inhaler Commonly known as: VENTOLIN HFA Inhale 2 puffs into the lungs every 6 (six) hours as needed for wheezing or shortness of breath.   ALPRAZolam 0.5 MG tablet Commonly known as: XANAX Take 0.25-0.5 mg by mouth 2 (two) times daily. Take 0.25 every morning and 0.5 mg every evening   amLODipine 5 MG tablet Commonly known as: NORVASC Take 1 tablet (5 mg total) by mouth daily.   aspirin EC 81 MG tablet Take 1 tablet (81 mg total) by mouth daily. Swallow whole.   atorvastatin 20 MG tablet Commonly known as:  LIPITOR Take 1 tablet (20 mg total) by mouth every evening.   budesonide-formoterol 160-4.5 MCG/ACT inhaler Commonly known as: SYMBICORT Inhale 2 puffs into the lungs 2 (two) times daily.   clopidogrel 75 MG tablet Commonly known as: PLAVIX Take 1 tablet (75 mg total) by mouth daily.   cyanocobalamin 1000 MCG tablet Take 1 tablet (1,000 mcg total) by mouth daily.   feeding supplement Liqd Take 237 mLs by mouth 2 (two) times daily between meals.   ferrous sulfate 325 (65 FE) MG tablet Take 1 tablet (325 mg total) by mouth daily with breakfast.   fluticasone 50 MCG/ACT nasal spray Commonly known as: FLONASE Place 2 sprays into both nostrils at bedtime.   hydroxypropyl methylcellulose / hypromellose 2.5 % ophthalmic solution Commonly known as: ISOPTO TEARS / GONIOVISC Place 1 drop into both eyes 3 (three) times daily as needed for dry eyes.   leflunomide 20 MG tablet Commonly  known as: ARAVA Take 1 tablet (20 mg total) by mouth daily.   magnesium oxide 400 (240 Mg) MG tablet Commonly known as: MAG-OX Take 1 tablet (400 mg total) by mouth 2 (two) times daily for 14 days.   metoprolol tartrate 25 MG tablet Commonly known as: LOPRESSOR Take 1 tablet (25 mg total) by mouth 2 (two) times daily.   multivitamin with minerals Tabs tablet Take 1 tablet by mouth daily.   pantoprazole 40 MG tablet Commonly known as: PROTONIX Take 1 tablet (40 mg total) by mouth daily at 6 (six) AM.   ramipril 10 MG capsule Commonly known as: ALTACE Take 20 mg by mouth daily.   senna-docusate 8.6-50 MG tablet Commonly known as: Senokot-S Take 1 tablet by mouth at bedtime as needed for mild constipation.   traMADol 50 MG tablet Commonly known as: ULTRAM Take 2 tablets (100 mg total) by mouth every 6 (six) hours as needed for moderate pain or severe pain.         Follow-up Information     Haddix, Gillie Manners, MD. Schedule an appointment as soon as possible for a visit in 2 week(s).    Specialty: Orthopedic Surgery Why: for repeat x-rays and wound check Contact information: [address] [place] [address] [phone]         Richmond Campbell., PA-C Follow up in 1 week(s).   Specialty: Family Medicine Contact information: [address] [place] [address] [phone]         Laqueta Linden, MD .   Specialty: Cardiology Contact information: [address] [place] [address] [phone]                 Allergies  Allergen Reactions   Penicillins Swelling, Anaphylaxis, Hives and Nausea And Vomiting    Has patient had a PCN reaction causing immediate rash, facial/tongue/throat swelling, SOB or lightheadedness with hypotension: Yes Has patient had a PCN reaction causing severe rash involving mucus membranes or skin necrosis: No Has patient had a PCN reaction that required hospitalization No Has patient had a PCN reaction occurring within the last 10 years: No If all of the above answers are "NO", then may proceed with Cephalosporin use.  Has patient had a PCN reaction causing immediate rash, facial/tongue/throat swelling, SOB or lightheadedness with hypotension: Yes Has patient had a PCN reaction causing severe rash involving mucus membranes or skin necrosis: No Has patient had a PCN reaction that required hospitalization No Has patient had a PCN reaction occurring within the last 10 years: No If all of the above answers are "NO", then may proceed with Cephalosporin use.    Sulfa Antibiotics Hives   Cortisone Other (See Comments)    Irregular Heart Beat   Morphine And Related Nausea And Vomiting   Vicodin [Hydrocodone-Acetaminophen] Nausea And Vomiting   Motrin [Ibuprofen]      The results of significant diagnostics from this hospitalization (including imaging, microbiology, ancillary and laboratory) are listed below for reference.   Consultations:   Procedures/Studies: DG Pelvis 1-2 Views  Result Date:  05/05/2021 CLINICAL DATA:  Fall EXAM: PELVIS - 1-2 VIEW COMPARISON:  01/02/2019 FINDINGS: Right hip replacement in satisfactory position and alignment. No pelvic fracture. Normal left hip. Atherosclerotic calcification noted. IMPRESSION: Right hip replacement.  Negative for pelvic fracture. Electronically Signed   By: Marlan Palau M.D.   On: 05/05/2021 19:06   CT Head Wo Contrast  Result Date: 05/05/2021 CLINICAL DATA:  Head trauma.  Fall  at home. EXAM: CT HEAD WITHOUT CONTRAST TECHNIQUE: Contiguous axial images were obtained from the base of the skull through the vertex without intravenous contrast. COMPARISON:  Head CT and brain MRI 02/15/2021 FINDINGS: Brain: No intracranial hemorrhage, mass effect, or midline shift. Stable degree of atrophy. No hydrocephalus. The basilar cisterns are patent. Advanced chronic small vessel ischemia. No definite CT correlate for the multiple punctate infarcts in the left cerebral hemisphere on prior MRI. No evidence of territorial infarct or acute ischemia. No extra-axial or intracranial fluid collection. Vascular: Punctate calcifications in the left frontal lobe are unchanged from prior exam. There is no hyperdense vessel. Skull: No fracture or focal lesion. Sinuses/Orbits: Paranasal sinuses and mastoid air cells are clear. The visualized orbits are unremarkable. Bilateral lens extraction. Other: None. IMPRESSION: 1. No acute intracranial abnormality. No skull fracture. 2. Stable atrophy and chronic small vessel ischemia. Electronically Signed   By: Narda Rutherford M.D.   On: 05/05/2021 20:22   DG Chest Portable 1 View  Result Date: 05/05/2021 CLINICAL DATA:  Fall EXAM: PORTABLE CHEST 1 VIEW COMPARISON:  02/15/2021 FINDINGS: The heart size and mediastinal contours are within normal limits. Both lungs are clear. The visualized skeletal structures are unremarkable. Atherosclerotic aortic arch. IMPRESSION: No active disease. Electronically Signed   By: Marlan Palau M.D.    On: 05/05/2021 19:29   DG C-Arm 1-60 Min  Result Date: 05/06/2021 CLINICAL DATA:  ORIF distal right femur fracture. EXAM: RIGHT FEMUR 2 VIEWS; DG C-ARM 1-60 MIN COMPARISON:  Radiograph yesterday. FINDINGS: Nine fluoroscopic spot views of the right femur obtained in the operating room in frontal and lateral projections. Lateral plate and multi screw fixation of femoral shaft fracture. Hip and knee arthroplasties are partially included. Fluoroscopy time 1 minutes 27 seconds. Dose 5.31 mGy. IMPRESSION: Fluoroscopic spot views during ORIF of right femur fracture. Electronically Signed   By: Narda Rutherford M.D.   On: 05/06/2021 18:09   DG Femur Portable 1 View Right  Result Date: 05/05/2021 CLINICAL DATA:  Fall. EXAM: RIGHT FEMUR PORTABLE 1 VIEW COMPARISON:  12/31/2018 FINDINGS: Right hip replacement in satisfactory position and alignment. There is an oblique fracture of the mid to distal femur with displacement and mild angulation. Right knee replacement is present. The fracture ends above the prosthesis. IMPRESSION: Fracture mid to distal femur on the right with mild angulation and displacement Right hip and right knee replacement. Electronically Signed   By: Marlan Palau M.D.   On: 05/05/2021 19:05   DG FEMUR, MIN 2 VIEWS RIGHT  Result Date: 05/06/2021 CLINICAL DATA:  ORIF distal right femur fracture. EXAM: RIGHT FEMUR 2 VIEWS; DG C-ARM 1-60 MIN COMPARISON:  Radiograph yesterday. FINDINGS: Nine fluoroscopic spot views of the right femur obtained in the operating room in frontal and lateral projections. Lateral plate and multi screw fixation of femoral shaft fracture. Hip and knee arthroplasties are partially included. Fluoroscopy time 1 minutes 27 seconds. Dose 5.31 mGy. IMPRESSION: Fluoroscopic spot views during ORIF of right femur fracture. Electronically Signed   By: Narda Rutherford M.D.   On: 05/06/2021 18:09   DG FEMUR PORT, MIN 2 VIEWS RIGHT  Result Date: 05/06/2021 CLINICAL DATA:   87 year female status post internal fixation of right femoral fracture. EXAM: RIGHT FEMUR PORTABLE 2 VIEW COMPARISON:  None. FINDINGS: There is a right hip arthroplasty. Right femoral fixation sideplate and screw noted through an oblique distal femoral diaphysis fracture. The hardware is intact. There is a total knee arthroplasty. Postsurgical changes with small pockets  of air in the soft tissues of the right thigh. Vascular calcifications. IMPRESSION: Status post internal fixation of an oblique distal femoral diaphysis fracture. Electronically Signed   By: Elgie Collard M.D.   On: 05/06/2021 19:28      Labs: BNP (last 3 results) No results for input(s): BNP in the last 8760 hours. Basic Metabolic Panel: Recent Labs  Lab 05/06/21 0430 05/07/21 0438 05/08/21 0405 05/09/21 0358 05/10/21 0131 05/11/21 0149 05/12/21 0325  NA 138 136  --  136 136 136 136  K 4.4 4.3  --  3.6 3.0* 4.9 4.0  CL 107 109  --  107 106 108 104  CO2 22 22  --  20* 24 23 25   GLUCOSE 188* 129*  --  82 83 88 91  BUN 25* 27*  --  17 13 14 16   CREATININE 1.11* 1.33*  --  0.85 0.79 0.92 0.92  CALCIUM 8.0* 7.6*  --  8.3* 7.9* 8.0* 8.3*  MG 1.8 1.6* 2.0 1.8 1.7 1.7 1.6*  PHOS 3.8  --   --   --   --   --   --    Liver Function Tests: Recent Labs  Lab 05/05/21 1901 05/06/21 0430  AST 81* 79*  ALT 81* 87*  ALKPHOS 123 117  BILITOT 0.5 0.9  PROT 5.3* 5.6*  ALBUMIN 3.1* 3.2*   No results for input(s): LIPASE, AMYLASE in the last 168 hours. No results for input(s): AMMONIA in the last 168 hours. CBC: Recent Labs  Lab 05/05/21 1901 05/06/21 0430 05/07/21 0438 05/09/21 0358 05/10/21 0131 05/11/21 0149 05/12/21 0325  WBC 13.0*   < > 6.8 7.2 5.9 6.9 8.1  NEUTROABS 8.5*  --   --   --   --   --   --   HGB 8.4*   < > 7.2* 8.6* 8.3* 8.8* 8.2*  HCT 27.7*   < > 22.3* 26.4* 24.8* 26.8* 25.0*  MCV 93.6   < > 89.9 88.6 87.0 88.7 88.7  PLT 283   < > 165 161 153 190 195   < > = values in this interval not  displayed.   Cardiac Enzymes: No results for input(s): CKTOTAL, CKMB, CKMBINDEX, TROPONINI in the last 168 hours. BNP: Invalid input(s): POCBNP CBG: Recent Labs  Lab 05/06/21 0742  GLUCAP 174*   D-Dimer No results for input(s): DDIMER in the last 72 hours. Hgb A1c No results for input(s): HGBA1C in the last 72 hours. Lipid Profile No results for input(s): CHOL, HDL, LDLCALC, TRIG, CHOLHDL, LDLDIRECT in the last 72 hours. Thyroid function studies No results for input(s): TSH, T4TOTAL, T3FREE, THYROIDAB in the last 72 hours.  Invalid input(s): FREET3 Anemia work up No results for input(s): VITAMINB12, FOLATE, FERRITIN, TIBC, IRON, RETICCTPCT in the last 72 hours. Urinalysis    Component Value Date/Time   COLORURINE YELLOW 12/17/2020 2047   APPEARANCEUR CLEAR 12/17/2020 2047   LABSPEC >1.046 (H) 12/17/2020 2047   PHURINE 6.0 12/17/2020 2047   GLUCOSEU NEGATIVE 12/17/2020 2047   HGBUR NEGATIVE 12/17/2020 2047   BILIRUBINUR NEGATIVE 12/17/2020 2047   KETONESUR 5 (A) 12/17/2020 2047   PROTEINUR NEGATIVE 12/17/2020 2047   NITRITE NEGATIVE 12/17/2020 2047   LEUKOCYTESUR TRACE (A) 12/17/2020 2047   Sepsis Labs Invalid input(s): PROCALCITONIN,  WBC,  LACTICIDVEN Microbiology Recent Results (from the past 240 hour(s))  Resp Panel by RT-PCR (Flu A&B, Covid) Nasopharyngeal Swab     Status: None   Collection Time: 05/05/21 10:13 PM  Specimen: Nasopharyngeal Swab; Nasopharyngeal(NP) swabs in vial transport medium  Result Value Ref Range Status   SARS Coronavirus 2 by RT PCR NEGATIVE NEGATIVE Final    Comment: (NOTE) SARS-CoV-2 target nucleic acids are NOT DETECTED.  The SARS-CoV-2 RNA is generally detectable in upper respiratory specimens during the acute phase of infection. The lowest concentration of SARS-CoV-2 viral copies this assay can detect is 138 copies/mL. A negative result does not preclude SARS-Cov-2 infection and should not be used as the sole basis for treatment  or other patient management decisions. A negative result may occur with  improper specimen collection/handling, submission of specimen other than nasopharyngeal swab, presence of viral mutation(s) within the areas targeted by this assay, and inadequate number of viral copies(<138 copies/mL). A negative result must be combined with clinical observations, patient history, and epidemiological information. The expected result is Negative.  Fact Sheet for Patients:  BloggerCourse.com  Fact Sheet for Healthcare Providers:  SeriousBroker.it  This test is no t yet approved or cleared by the [place] FDA and  has been authorized for detection and/or diagnosis of SARS-CoV-2 by FDA under an Emergency Use Authorization (EUA). This EUA will remain  in effect (meaning this test can be used) for the duration of the COVID-19 declaration under Section 564(b)(1) of the Act, 21 U.S.C.section 360bbb-3(b)(1), unless the authorization is terminated  or revoked sooner.       Influenza A by PCR NEGATIVE NEGATIVE Final   Influenza B by PCR NEGATIVE NEGATIVE Final    Comment: (NOTE) The Xpert Xpress SARS-CoV-2/FLU/RSV plus assay is intended as an aid in the diagnosis of influenza from Nasopharyngeal swab specimens and should not be used as a sole basis for treatment. Nasal washings and aspirates are unacceptable for Xpert Xpress SARS-CoV-2/FLU/RSV testing.  Fact Sheet for Patients: BloggerCourse.com  Fact Sheet for Healthcare Providers: SeriousBroker.it  This test is not yet approved or cleared by the [place] FDA and has been authorized for detection and/or diagnosis of SARS-CoV-2 by FDA under an Emergency Use Authorization (EUA). This EUA will remain in effect (meaning this test can be used) for the duration of the COVID-19 declaration under Section 564(b)(1) of the Act, 21 U.S.C. section  360bbb-3(b)(1), unless the authorization is terminated or revoked.  Performed at [facility], [address], [place], [place] [address]   SARS CORONAVIRUS 2 (TAT 6-24 HRS) Nasopharyngeal Nasopharyngeal Swab     Status: None   Collection Time: 05/11/21  1:43 PM   Specimen: Nasopharyngeal Swab  Result Value Ref Range Status   SARS Coronavirus 2 NEGATIVE NEGATIVE Final    Comment: (NOTE) SARS-CoV-2 target nucleic acids are NOT DETECTED.  The SARS-CoV-2 RNA is generally detectable in upper and lower respiratory specimens during the acute phase of infection. Negative results do not preclude SARS-CoV-2 infection, do not rule out co-infections with other pathogens, and should not be used as the sole basis for treatment or other patient management decisions. Negative results must be combined with clinical observations, patient history, and epidemiological information. The expected result is Negative.  Fact Sheet for Patients: HairSlick.no  Fact Sheet for Healthcare Providers: quierodirigir.com  This test is not yet approved or cleared by the [place] FDA and  has been authorized for detection and/or diagnosis of SARS-CoV-2 by FDA under an Emergency Use Authorization (EUA). This EUA will remain  in effect (meaning this test can be used) for the duration of the COVID-19 declaration under Se ction 564(b)(1) of the Act, 21 U.S.C. section 360bbb-3(b)(1), unless  the authorization is terminated or revoked sooner.  Performed at [facility], [address], [place], [place] [address]      Total time spend on discharging this patient, including the last patient exam, discussing the hospital stay, instructions for ongoing care as it relates to all pertinent caregivers, as well as preparing the medical discharge records, prescriptions, and/or referrals as applicable, is 35 minutes.    Darlin Priestly, MD  [facility] 05/12/2021, 10:56 AM

## 2021-05-12 NOTE — Progress Notes (Signed)
[facility] to be D/C'd Skilled nursing facility per MD order.  Discussed with the patient and all questions fully answered.  An After Visit Summary was printed and given to the patient's daughter and PTAR. PTAR received prescriptions.  D/c education completed with patient/family including follow up instructions, medication list, d/c activities limitations if indicated, with other d/c instructions as indicated by MD - patient able to verbalize understanding, all questions fully answered.   Patient instructed to return to ED, call 911, or call MD for any changes in condition.   Report called to receiving nurse at skilled nursing facility.   Patient escorted via stretcher, and D/C to [facility] via [facility].  Pauletta Browns 05/12/2021 4:54 PM

## 2021-05-12 NOTE — Progress Notes (Signed)
Occupational Therapy Treatment Patient Details Name: [name] [name] MRN: [id number] DOB: 05/20/1932 Today's Date: 05/12/2021    History of present illness 87 y.o. female s/p fall at home with resulting R femur fx. Underwent ORIF R femur 05/06/21. Medical history significant for  rheumatoid arthritis, oxygen dependent COPD (O2 at night), hypertension, prior strokes (01/2021 with residual R sided weakness), R THA,, falls, anxiety disorder.   OT comments  Agness is progressing well with plan to d/c to SNF today. Pt completed bed mobility, sit<>stand and short ambulation with RW given min A, and verbal cues for safety. Pt completed upper body dressing with min A and lower body dressing with max A +2. Pt would benefit from continued OT acutely to progress function in ADLs and mobility. D/c plan remains appropriate.    Follow Up Recommendations  SNF    Equipment Recommendations  None recommended by OT       Precautions / Restrictions Precautions Precautions: Fall Restrictions Weight Bearing Restrictions: Yes RLE Weight Bearing: Weight bearing as tolerated       Mobility Bed Mobility Overal bed mobility: Needs Assistance Bed Mobility: Supine to Sit     Supine to sit: Min assist     General bed mobility comments: Min A for RLE management    Transfers Overall transfer level: Needs assistance Equipment used: Rolling walker (2 wheeled) Transfers: Sit to/from Stand Sit to Stand: Min assist Stand pivot transfers: Min assist       General transfer comment: cues for hand placement and safety; min A to balance and weight shift        ADL either performed or assessed with clinical judgement   ADL Overall ADL's : Needs assistance/impaired                 Upper Body Dressing : Minimal assistance;Sitting Upper Body Dressing Details (indicate cue type and reason): pt attempting to put shirt on wrong, requried increased time for problem solving & min A to adjust shirt Lower  Body Dressing: Maximal assistance;Sit to/from stand Lower Body Dressing Details (indicate cue type and reason): +2 assistance helpful for lower body dressing Toilet Transfer: Minimal assistance;RW;BSC   Toileting- Clothing Manipulation and Hygiene: Total assistance Toileting - Clothing Manipulation Details (indicate cue type and reason): +2 assist helpful     Functional mobility during ADLs: Minimal assistance;+2 for physical assistance;+2 for safety/equipment General ADL Comments: pt dressed, toileted and compelted functional mobility this session +2 assistance helpful throguhout. Pt requried incrased time and vc for safety      Cognition Arousal/Alertness: Awake/alert Behavior During Therapy: WFL for tasks assessed/performed Overall Cognitive Status: Impaired/Different from baseline Area of Impairment: Following commands;Safety/judgement;Awareness                   Current Attention Level: Sustained   Following Commands: Follows one step commands inconsistently Safety/Judgement: Decreased awareness of safety;Decreased awareness of deficits Awareness: Emergent Problem Solving: Slow processing General Comments: required repetition; pt attempting to sit too soon several times throughout the session              General Comments VSS on RA    Pertinent Vitals/ Pain       Pain Assessment: Faces Faces Pain Scale: Hurts a little bit Pain Location: R hip Pain Descriptors / Indicators: Discomfort Pain Intervention(s): Limited activity within patient's tolerance   Frequency  Min 2X/week        Progress Toward Goals  OT Goals(current goals can now be found in the care  plan section)  Progress towards OT goals: Progressing toward goals  Acute Rehab OT Goals Patient Stated Goal: per family to get rehab at [facility] OT Goal Formulation: With patient/family Time For Goal Achievement: 05/21/21 Potential to Achieve Goals: Good ADL Goals Pt Will Perform Lower Body  Bathing: with min assist;sit to/from stand Pt Will Transfer to Toilet: with min assist;bedside commode;stand pivot transfer Pt Will Perform Toileting - Clothing Manipulation and hygiene: with min assist;sitting/lateral leans;sit to/from stand  Plan Discharge plan remains appropriate       AM-PAC OT "6 Clicks" Daily Activity     Outcome Measure   Help from another person eating meals?: None Help from another person taking care of personal grooming?: A Little Help from another person toileting, which includes using toliet, bedpan, or urinal?: A Lot Help from another person bathing (including washing, rinsing, drying)?: A Lot   Help from another person to put on and taking off regular lower body clothing?: A Lot 6 Click Score: 13    End of Session Equipment Utilized During Treatment: Gait belt;Rolling walker;Other (comment) (BSC)  OT Visit Diagnosis: Unsteadiness on feet (R26.81);Other abnormalities of gait and mobility (R26.89);Muscle weakness (generalized) (M62.81);History of falling (Z91.81);Other symptoms and signs involving cognitive function;Pain Pain - Right/Left: Right Pain - part of body: Hip   Activity Tolerance Patient tolerated treatment well   Patient Left in chair;with call bell/phone within reach;with chair alarm set;with family/visitor present   Nurse Communication Mobility status;Precautions;Weight bearing status        Time: 5035-4656 OT Time Calculation (min): 36 min  Charges: OT General Charges $OT Visit: 1 Visit OT Treatments $Self Care/Home Management : 23-37 mins     Claudy Abdallah A Phu Record 05/12/2021, 1:22 PM

## 2021-05-15 ENCOUNTER — Telehealth: Payer: Self-pay

## 2021-05-15 NOTE — Telephone Encounter (Signed)
Left message for patient to call back to see if truly needs to have a follow up appointment for cardiology issues or if hospital set it up?

## 2021-05-15 NOTE — Telephone Encounter (Signed)
-----   Message from [name], MD sent at 05/14/2021  6:54 PM EDT ----- Why is patient on my schedule next week Last saw [name] [name] 2 years ago with PRN f/u She was d/c from hospital with a fall and orthopedic hip fracture She should f/u with ortho and primary No need for cardiac f/u

## 2021-05-20 ENCOUNTER — Ambulatory Visit: Payer: Medicare HMO | Admitting: Cardiovascular Disease

## 2021-06-15 DEATH — deceased
